# Patient Record
Sex: Female | Born: 1985 | Race: Black or African American | Hispanic: No | Marital: Married | Smoking: Never smoker
Health system: Southern US, Community
[De-identification: ages and names within clinical notes are randomized; demographics above are authoritative.]

## PROBLEM LIST (undated history)

## (undated) DIAGNOSIS — F329 Major depressive disorder, single episode, unspecified: Secondary | ICD-10-CM

## (undated) DIAGNOSIS — O99345 Other mental disorders complicating the puerperium: Secondary | ICD-10-CM

## (undated) DIAGNOSIS — F32A Depression, unspecified: Secondary | ICD-10-CM

## (undated) DIAGNOSIS — R87619 Unspecified abnormal cytological findings in specimens from cervix uteri: Secondary | ICD-10-CM

## (undated) DIAGNOSIS — A64 Unspecified sexually transmitted disease: Secondary | ICD-10-CM

## (undated) DIAGNOSIS — Z8614 Personal history of Methicillin resistant Staphylococcus aureus infection: Secondary | ICD-10-CM

## (undated) DIAGNOSIS — IMO0002 Reserved for concepts with insufficient information to code with codable children: Secondary | ICD-10-CM

## (undated) DIAGNOSIS — D509 Iron deficiency anemia, unspecified: Secondary | ICD-10-CM

## (undated) DIAGNOSIS — F53 Postpartum depression: Secondary | ICD-10-CM

## (undated) HISTORY — DX: Reserved for concepts with insufficient information to code with codable children: IMO0002

## (undated) HISTORY — PX: TUBAL LIGATION: SHX77

## (undated) HISTORY — DX: Unspecified abnormal cytological findings in specimens from cervix uteri: R87.619

## (undated) HISTORY — PX: WISDOM TOOTH EXTRACTION: SHX21

## (undated) HISTORY — PX: CERVICAL BIOPSY  W/ LOOP ELECTRODE EXCISION: SUR135

## (undated) HISTORY — DX: Unspecified sexually transmitted disease: A64

## (undated) HISTORY — PX: OTHER SURGICAL HISTORY: SHX169

---
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
## 2006-07-14 ENCOUNTER — Inpatient Hospital Stay (HOSPITAL_COMMUNITY): Admission: AD | Admit: 2006-07-14 | Discharge: 2006-07-14 | Payer: Self-pay | Admitting: Family Medicine

## 2006-07-30 ENCOUNTER — Inpatient Hospital Stay (HOSPITAL_COMMUNITY): Admission: AD | Admit: 2006-07-30 | Discharge: 2006-07-31 | Payer: Self-pay | Admitting: Obstetrics and Gynecology

## 2006-07-30 ENCOUNTER — Ambulatory Visit: Payer: Self-pay | Admitting: *Deleted

## 2006-10-29 ENCOUNTER — Inpatient Hospital Stay (HOSPITAL_COMMUNITY): Admission: AD | Admit: 2006-10-29 | Discharge: 2006-10-31 | Payer: Self-pay | Admitting: Gynecology

## 2006-10-29 ENCOUNTER — Ambulatory Visit: Payer: Self-pay | Admitting: *Deleted

## 2007-03-19 ENCOUNTER — Emergency Department (HOSPITAL_COMMUNITY): Admission: EM | Admit: 2007-03-19 | Discharge: 2007-03-19 | Payer: Self-pay | Admitting: Emergency Medicine

## 2007-11-03 ENCOUNTER — Emergency Department (HOSPITAL_COMMUNITY): Admission: EM | Admit: 2007-11-03 | Discharge: 2007-11-03 | Payer: Self-pay | Admitting: Emergency Medicine

## 2007-11-07 ENCOUNTER — Ambulatory Visit: Payer: Self-pay | Admitting: Obstetrics and Gynecology

## 2007-12-05 ENCOUNTER — Ambulatory Visit: Payer: Self-pay | Admitting: Obstetrics and Gynecology

## 2007-12-11 ENCOUNTER — Other Ambulatory Visit: Admission: RE | Admit: 2007-12-11 | Discharge: 2007-12-11 | Payer: Self-pay | Admitting: Obstetrics and Gynecology

## 2007-12-11 ENCOUNTER — Ambulatory Visit: Payer: Self-pay | Admitting: Obstetrics & Gynecology

## 2007-12-25 ENCOUNTER — Ambulatory Visit: Payer: Self-pay | Admitting: Obstetrics and Gynecology

## 2008-02-23 ENCOUNTER — Emergency Department (HOSPITAL_COMMUNITY): Admission: EM | Admit: 2008-02-23 | Discharge: 2008-02-23 | Payer: Self-pay | Admitting: Family Medicine

## 2008-04-29 ENCOUNTER — Ambulatory Visit: Payer: Self-pay | Admitting: Gynecology

## 2008-04-29 ENCOUNTER — Encounter (INDEPENDENT_AMBULATORY_CARE_PROVIDER_SITE_OTHER): Payer: Self-pay | Admitting: Gynecology

## 2008-10-29 ENCOUNTER — Encounter: Payer: Self-pay | Admitting: Obstetrics & Gynecology

## 2008-10-29 ENCOUNTER — Ambulatory Visit: Payer: Self-pay | Admitting: Obstetrics and Gynecology

## 2009-05-05 ENCOUNTER — Encounter: Payer: Self-pay | Admitting: Obstetrics & Gynecology

## 2009-05-05 ENCOUNTER — Ambulatory Visit: Payer: Self-pay | Admitting: Obstetrics and Gynecology

## 2009-05-05 LAB — CONVERTED CEMR LAB: GC Probe Amp, Genital: NEGATIVE

## 2009-05-06 ENCOUNTER — Encounter: Payer: Self-pay | Admitting: Obstetrics & Gynecology

## 2009-05-06 LAB — CONVERTED CEMR LAB: Yeast Wet Prep HPF POC: NONE SEEN

## 2009-11-03 ENCOUNTER — Ambulatory Visit: Payer: Self-pay | Admitting: Obstetrics and Gynecology

## 2010-03-21 ENCOUNTER — Ambulatory Visit: Payer: Self-pay | Admitting: Obstetrics & Gynecology

## 2010-04-11 ENCOUNTER — Inpatient Hospital Stay (HOSPITAL_COMMUNITY): Admission: AD | Admit: 2010-04-11 | Discharge: 2010-04-11 | Payer: Self-pay | Admitting: Obstetrics and Gynecology

## 2010-05-13 ENCOUNTER — Emergency Department (HOSPITAL_COMMUNITY): Admission: EM | Admit: 2010-05-13 | Discharge: 2010-05-13 | Payer: Self-pay | Admitting: Emergency Medicine

## 2010-05-15 ENCOUNTER — Emergency Department (HOSPITAL_COMMUNITY): Admission: EM | Admit: 2010-05-15 | Discharge: 2010-05-15 | Payer: Self-pay | Admitting: Emergency Medicine

## 2010-07-01 ENCOUNTER — Emergency Department (HOSPITAL_COMMUNITY): Admission: EM | Admit: 2010-07-01 | Discharge: 2010-07-01 | Payer: Self-pay | Admitting: Emergency Medicine

## 2010-08-07 NOTE — L&D Delivery Note (Signed)
Delivery Note   [name], [name] [[id number]]  At 7:04 AM a viable female was delivered via Vaginal, Spontaneous Delivery (Presentation: Right Occiput Anterior).  APGAR: 8, 9; weight 5 lb 11.9 oz (2605 g).   Placenta status: , .  Cord: 3 vessels with the following complications: None.  Anesthesia: Epidural  Episiotomy: None Lacerations: None Suture Repair: n/a     [name], [name] [[id number]]  At 7:29 AM a viable female was delivered via  (Presentation: Left Occiput Anterio ;  ).  APGAR: 7,9 , ; weight 5 lb 9.4 oz (2534 g).   Placenta status: , .  Cord:  with the following complications: .  Anesthesia: Epidural  Episiotomy:  Lacerations:  Suture Repair: n/a Est. Blood Loss (mL): 600cc  1000 micrograms cytotec per rectum for mild pph.   Mom to AICU for Magnesium sulfate due preeclampsia Baby A to nursery-stable.   Baby B to nursery-stable.  Brittany Transue H. 06/02/2011, 7:59 AM

## 2010-08-23 ENCOUNTER — Emergency Department (HOSPITAL_COMMUNITY)
Admission: EM | Admit: 2010-08-23 | Discharge: 2010-08-23 | Payer: Self-pay | Source: Home / Self Care | Admitting: Emergency Medicine

## 2010-10-17 ENCOUNTER — Emergency Department (HOSPITAL_COMMUNITY)
Admission: EM | Admit: 2010-10-17 | Discharge: 2010-10-17 | Discharge status: Against Medical Advice | Payer: Self-pay | Attending: Emergency Medicine | Admitting: Emergency Medicine

## 2010-10-17 DIAGNOSIS — N63 Unspecified lump in unspecified breast: Secondary | ICD-10-CM | POA: Insufficient documentation

## 2010-10-19 LAB — CULTURE, ROUTINE-ABSCESS

## 2010-10-20 LAB — URINALYSIS, ROUTINE W REFLEX MICROSCOPIC
Bilirubin Urine: NEGATIVE
Leukocytes, UA: NEGATIVE
Specific Gravity, Urine: 1.025 (ref 1.005–1.030)
Urobilinogen, UA: 1 mg/dL (ref 0.0–1.0)

## 2010-10-20 LAB — GC/CHLAMYDIA PROBE AMP, GENITAL: Chlamydia, DNA Probe: NEGATIVE

## 2010-10-20 LAB — WET PREP, GENITAL

## 2010-10-20 LAB — URINE MICROSCOPIC-ADD ON

## 2010-10-24 ENCOUNTER — Ambulatory Visit: Payer: Self-pay | Admitting: Occupational Therapy

## 2010-12-13 ENCOUNTER — Inpatient Hospital Stay (HOSPITAL_COMMUNITY)
Admission: AD | Admit: 2010-12-13 | Discharge: 2010-12-13 | Disposition: A | Discharge status: Home / Self Care | Payer: Medicaid Other | Source: Ambulatory Visit | Attending: Obstetrics and Gynecology | Admitting: Obstetrics and Gynecology

## 2010-12-13 DIAGNOSIS — O2390 Unspecified genitourinary tract infection in pregnancy, unspecified trimester: Secondary | ICD-10-CM | POA: Insufficient documentation

## 2010-12-13 DIAGNOSIS — N760 Acute vaginitis: Secondary | ICD-10-CM | POA: Insufficient documentation

## 2010-12-13 DIAGNOSIS — B9689 Other specified bacterial agents as the cause of diseases classified elsewhere: Secondary | ICD-10-CM | POA: Insufficient documentation

## 2010-12-13 DIAGNOSIS — R109 Unspecified abdominal pain: Secondary | ICD-10-CM

## 2010-12-13 DIAGNOSIS — A499 Bacterial infection, unspecified: Secondary | ICD-10-CM

## 2010-12-13 LAB — WET PREP, GENITAL

## 2010-12-13 LAB — URINALYSIS, ROUTINE W REFLEX MICROSCOPIC
Ketones, ur: NEGATIVE mg/dL
Nitrite: NEGATIVE
Protein, ur: NEGATIVE mg/dL
Urobilinogen, UA: 0.2 mg/dL (ref 0.0–1.0)

## 2010-12-13 LAB — CBC
MCH: 30.5 pg (ref 26.0–34.0)
MCHC: 34.3 g/dL (ref 30.0–36.0)
MCV: 89.2 fL (ref 78.0–100.0)
RBC: 4.06 MIL/uL (ref 3.87–5.11)
WBC: 4.6 10*3/uL (ref 4.0–10.5)

## 2010-12-14 LAB — GC/CHLAMYDIA PROBE AMP, GENITAL: Chlamydia, DNA Probe: NEGATIVE

## 2010-12-19 ENCOUNTER — Other Ambulatory Visit: Payer: Self-pay | Admitting: Family Medicine

## 2010-12-19 DIAGNOSIS — Z3689 Encounter for other specified antenatal screening: Secondary | ICD-10-CM

## 2010-12-19 LAB — GLUCOSE TOLERANCE, 1 HOUR: Glucose, GTT - 1 Hour: 76 mg/dL (ref ?–200)

## 2010-12-19 LAB — RUBELLA ANTIBODY, IGM: Rubella: IMMUNE

## 2010-12-19 LAB — RPR: RPR: NONREACTIVE

## 2010-12-19 LAB — CBC
HCT: 37 % (ref 36–46)
Hemoglobin: 12.2 g/dL (ref 12.0–16.0)
Platelets: 119 10*3/uL — AB (ref 150–399)

## 2010-12-19 LAB — GC/CHLAMYDIA PROBE AMP, GENITAL: Gonorrhea: NEGATIVE
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
## 2010-12-20 NOTE — Group Therapy Note (Signed)
NAME:  [name], [name] NO.:  [id number]   MEDICAL RECORD NO.:  [id number]          PATIENT TYPE:  WOC   LOCATION[facility]                   [facility]   PHYSICIAN:  Scheryl Darter, MD       DATE OF BIRTH:  Aug 01, 1986   DATE OF SERVICE:                                  CLINIC NOTE   The patient returns for a repeat Pap smear.  She has had a history of  severe cervical dysplasia after a LEEP was done on Dec 11, 2007.  The  surgical resection margins were negative.  Pap smear done May 02, 2008 showed ASCUS with high-risk HPV not detected.  The patient has been  treated for Trichomonas in the past.  She says her period is just  starting but is light.  She has an IUD in place.   PHYSICAL EXAMINATION:  GENERAL:  In no acute distress.  PELVIC:  External genitalia appeared normal.  Vagina appeared normal.  IUD strings visible about 2 cm.  Pap smear was obtained.   IMPRESSION:  Follow up after loop electrical excision procedure showing  severe dysplasia.   PLAN:  If Pap is normal, she will return for a repeat Pap in 6 months,  and then I would release her for yearly follow up.  STD probe was done  today at her request.      Scheryl Darter, MD     JA/MEDQ  D:  10/29/2008  T:  10/29/2008  Job:  [id number]

## 2010-12-20 NOTE — Group Therapy Note (Signed)
NAME:  [name], MANGAL NO.:  [id number]   MEDICAL RECORD NO.:  [id number]          PATIENT TYPE:  WOC   LOCATION:  [facility]                   [facility]   PHYSICIAN:  Ginger Carne, MD DATE OF BIRTH:  23-Jun-1986   DATE OF SERVICE:  04/29/2008                                  CLINIC NOTE   Patient returns today for follow-up Pap smear after having a LEEP  procedure revealing CIN III with endocervical gland involvement.  The  surgical resection margins were negative on Dec 11, 2007.   A Pap smear was performed today.  The cervix appears smooth without  erosions or lesions.  She was advised to return in six months.  She  indicated she will probably go back to [facility], which is fine,  and/or she was invited to return to our clinic in six months time.           ______________________________  Ginger Carne, MD     SHB/MEDQ  D:  04/29/2008  T:  04/29/2008  Job:  [id number]

## 2010-12-23 NOTE — Discharge Summary (Signed)
[facility]  Patient:    [name], [name]                        MRN: [id number] Adm. Date:  [id number] Disc. Date: [id number] Attending:  Michaelle Copas Dictator:   Jamey Reas, M.D.                           Discharge Summary  DATE OF BIRTH:                January 16, 1986  ADMISSION DIAGNOSES:          1. Preterm labor.                               2. Twenty-eight and 4-week intrauterine pregnancy.                               3. Positive group B strep.  DISCHARGE DIAGNOSES:          1. Preterm labor.                               2. Twenty-eight and 4-week intrauterine pregnancy.                               3. Positive group B strep.  CONSULTS:                     None.  REFERRING PHYSICIANS:         None.  PROCEDURES:                   None.  ADMISSION HISTORY:            A 25 year old, G1, P0, at 17 and 4 admitted for threatened preterm labor as well as rule out IUGR.  The patient was admitted for IV antibiotics, betamethasone, and ultrasound.  The patients cervix on admission was external os 2 cm, internal os finger tip, closed, long, and posterior.  Did have some lower uterine segment development.  MEDICATIONS ON ADMISSION:     1. Nifedipine 60 mg p.o. q.d.                               2. Trimox 500 mg.                               3. Iron.                               4. Prenatal vitamins.  PRENATAL LABORATORY DATA:     She was diagnosed with positive group B strep on March 26, 2000.  HOSPITAL COURSE:              The patient was admitted and placed on IV Unasyn.  She remained on IV Unasyn throughout hospitalization.  This was continued through April 17, 2000, when her IV infiltrated.  The patient was also maintained on continuous monitoring.  She received two doses of betamethasone 12.5 mg  IM q.24h. x 2.  She was also taking prenatal vitamins.  The patient remained on Procardia throughout  hospitalization.  She never received magnesium.  The patient remained stable throughout hospitalization.  Her cervix never changed.  She did not have any uterine activity over the past two to three days of hospitalization.  Her baby n continuous monitoring showed baseline approximately 135 to 145 with positive accelerations and reactive without decelerations.  No uterine activity on the day of discharge.  CONDITION ON DISCHARGE:       The patient was discharged to home, stable on p.o. Procardia.  DISCHARGE INSTRUCTIONS:       She will continue home uterine monitoring at home. She received antenatal discharge instructions prior to leaving.  FOLLOWUP:                     Follow up at [facility] on April 26, 2000, at 9 a.m. DD:  05/16/00 TD:  05/17/00 Job: [id number]

## 2010-12-23 NOTE — Discharge Summary (Signed)
[facility]  Patient:    [name], [name]                        MRN: [id number] Adm. Date:  [id number] Disc. Date: [id number] Attending:  Tammi Sou Dictator:   Pricilla Holm, M.D.                           Discharge Summary  ADMISSION DIAGNOSIS:          Preterm labor and cervicitis.  HISTORY:                      This is a 25 year old G1, P0 at 23-1/7 weeks by a 17-week ultrasound who presented to [facility] with decreased fetal movement and dysuria.  The patient was unaware of contractions at that time.  The patient was taking her prenatal vitamins and has a GYN history for yeast infections.  Her GC and chlamydia prenatally were negative and her vital signs were stable.  She was afebrile.  Physical exam was unremarkable.  Her cervical check was close, thick and high anterior.  Group B Strep was unknown at that time, now known to be positive.  Her wet prep showed negative ______ and too numerous to count white blood cells and moderate bacteria.  HOSPITAL COURSE:              The patient was admitted to the [facility] teaching service on March 04, 2000 to the antenatal department for IV Unasyn, as well as Motrin for 24 hours.  The patient did well on those regimens and contractions did cease by March 05, 2000.  The patient was taken off of Motrin and put on Procardia XL 60 mg q.d.  The patient was given bedrest with bathroom privileges.  The IV Unasyn was discontinued and the patient was started on Augmentin p.o. 875 mg b.i.d.  The patient continued to do well and was discharged to home on March 08, 2000.  CONDITION ON DISCHARGE:       Good.  DISPOSITION:                  The patient was discharged to home.  DISCHARGE MEDICATIONS:        The patient will continue taking her prenatal vitamin and she was also given a prescription for Procardia XL 60 mg to be taken q.d.  The patient was also given a prescription for  amoxicillin 500 mg 1 tablet p.o. b.i.d. x 14 days.  DISCHARGE INSTRUCTIONS:       The patient was told to limit her activity, bedrest, bathroom privileges.  She is to continue to drink plenty of fluids and take her medications as prescribed.  FOLLOW-UP:                    The patient is to follow up at high-risk clinic at [facility] on Thursdays.  This appointment will be made for her prior to her actual discharge from the hospital.  The patient voiced agreement and understanding of the above instructions.  Her mother will contact me if she has any additional questions or concerns. DD:  03/08/00 TD:  03/10/00 Job: [id number]

## 2010-12-23 NOTE — Discharge Summary (Signed)
[name], [name]                           ACCOUNT NO.:  [id number]   MEDICAL RECORD NO.:  [id number]                   PATIENT TYPE:  INP   LOCATION:  9165                                 [facility]   PHYSICIAN:  Phil D. Okey Dupre, M.D.                  DATE OF BIRTH:  1986-07-11   DATE OF ADMISSION:  01/14/2003  DATE OF DISCHARGE:  01/16/2003                                 DISCHARGE SUMMARY   REASON FOR ADMISSION:  Preterm labor.   HOSPITAL COURSE:  The patient is a 25 year old G2, P1 presented at  approximately [redacted] weeks gestational age with contractions.  Her cervix was 3  cm dilated, 50% effaced, -2 station.  She did progress, was being watched 2,  3 and -2.  She continued to have irregular contractions for the first 24-36  hours.  The last 12-24 hours; however, her contractions ceased.  She no  longer could feel the tightening and they were also not monitored.  Baby had  reactive reassuring strip with great variability.  She maintained no  cervical change for 24 hours.  She was GBS positive and was found to have a  few trichomonas on wet prep.  As such she was treated with IV penicillin and  also oral Flagyl.  She had an incidental finding of low platelet count of  122 and a white blood cell count of 5.3.  At the time of this dictation  those labs have been redrawn and are pending.  They will be added as an  addendum.  She was doing well on the morning of discharge and will be sent  home on modified bed rest to have close followup in the [facility].  Will be continuing her Flagyl for her trichomonas completing a  weeks' course of therapy.   FINAL DISCHARGE DIAGNOSES:  1. Preterm labor.  2. Trichomonal vaginitis.  3. Group B strep positive.   DISCHARGE INSTRUCTIONS:  1. Medications:  Flagyl 500 mg one tablet b.i.d. for 5-1/2 days with her     first dose starting tonight.  2. Diet is regular.  3. Activity:  Bed rest modified.  She may get up to eat and use  the bathroom     or shower.  4. Sexual activity:  She should not have sexual intercourse or do anything     that would cause an orgasm.   FOLLOW UP:  1. Followup in [facility].  Will arrange an appointment prior to     her discharge.  2. She will be discharged home.  She actually lives outside of [place]     but will be staying with family friends within the city such that if her     labor should begin again she would have easy access to [facility]     [place].        Obstetrics  Resident                       Javier Glazier. Okey Dupre, M.D.    OR/MEDQ  D:  01/16/2003  T:  01/16/2003  Job:  [id number]

## 2011-01-06 ENCOUNTER — Inpatient Hospital Stay (HOSPITAL_COMMUNITY): Admission: AD | Admit: 2011-01-06 | Payer: Self-pay | Source: Ambulatory Visit | Admitting: Family Medicine

## 2011-01-09 ENCOUNTER — Other Ambulatory Visit (HOSPITAL_COMMUNITY): Payer: Self-pay | Admitting: Obstetrics & Gynecology

## 2011-01-09 ENCOUNTER — Inpatient Hospital Stay (HOSPITAL_COMMUNITY)
Admission: AD | Admit: 2011-01-09 | Discharge: 2011-01-09 | Disposition: A | Discharge status: Home / Self Care | Payer: Medicaid Other | Source: Ambulatory Visit | Attending: Obstetrics & Gynecology | Admitting: Obstetrics & Gynecology

## 2011-01-09 ENCOUNTER — Ambulatory Visit (HOSPITAL_COMMUNITY)
Admission: RE | Admit: 2011-01-09 | Discharge: 2011-01-09 | Disposition: A | Discharge status: Home / Self Care | Payer: Medicaid Other | Source: Ambulatory Visit | Attending: Obstetrics & Gynecology | Admitting: Obstetrics & Gynecology

## 2011-01-09 DIAGNOSIS — R52 Pain, unspecified: Secondary | ICD-10-CM

## 2011-01-09 DIAGNOSIS — R109 Unspecified abdominal pain: Secondary | ICD-10-CM | POA: Insufficient documentation

## 2011-01-09 DIAGNOSIS — O210 Mild hyperemesis gravidarum: Secondary | ICD-10-CM | POA: Insufficient documentation

## 2011-01-09 DIAGNOSIS — O2390 Unspecified genitourinary tract infection in pregnancy, unspecified trimester: Secondary | ICD-10-CM | POA: Insufficient documentation

## 2011-01-09 DIAGNOSIS — Z3689 Encounter for other specified antenatal screening: Secondary | ICD-10-CM | POA: Insufficient documentation

## 2011-01-09 DIAGNOSIS — A499 Bacterial infection, unspecified: Secondary | ICD-10-CM | POA: Insufficient documentation

## 2011-01-09 DIAGNOSIS — N760 Acute vaginitis: Secondary | ICD-10-CM | POA: Insufficient documentation

## 2011-01-09 DIAGNOSIS — B9689 Other specified bacterial agents as the cause of diseases classified elsewhere: Secondary | ICD-10-CM | POA: Insufficient documentation

## 2011-01-09 LAB — URINALYSIS, ROUTINE W REFLEX MICROSCOPIC
Bilirubin Urine: NEGATIVE
Glucose, UA: NEGATIVE mg/dL
Leukocytes, UA: NEGATIVE
Nitrite: NEGATIVE
Specific Gravity, Urine: 1.025 (ref 1.005–1.030)

## 2011-01-09 LAB — WET PREP, GENITAL: Trich, Wet Prep: NONE SEEN

## 2011-01-09 LAB — URINE MICROSCOPIC-ADD ON

## 2011-01-10 ENCOUNTER — Other Ambulatory Visit: Payer: Self-pay | Admitting: Family Medicine

## 2011-01-10 DIAGNOSIS — Z3689 Encounter for other specified antenatal screening: Secondary | ICD-10-CM

## 2011-01-10 LAB — ABO/RH: ABO/RH(D): O POS

## 2011-01-10 LAB — CBC
Platelets: 131 10*3/uL — ABNORMAL LOW (ref 150–400)
RBC: 3.88 MIL/uL (ref 3.87–5.11)
WBC: 7 10*3/uL (ref 4.0–10.5)

## 2011-01-10 LAB — GC/CHLAMYDIA PROBE AMP, GENITAL
Chlamydia, DNA Probe: NEGATIVE
GC Probe Amp, Genital: NEGATIVE

## 2011-01-26 ENCOUNTER — Ambulatory Visit (HOSPITAL_COMMUNITY)
Admission: RE | Admit: 2011-01-26 | Discharge: 2011-01-26 | Disposition: A | Discharge status: Home / Self Care | Payer: Medicaid Other | Source: Ambulatory Visit | Attending: Family Medicine | Admitting: Family Medicine

## 2011-01-26 ENCOUNTER — Ambulatory Visit (HOSPITAL_COMMUNITY): Admission: RE | Admit: 2011-01-26 | Payer: Medicaid Other | Source: Ambulatory Visit

## 2011-01-26 ENCOUNTER — Encounter (HOSPITAL_COMMUNITY): Payer: Self-pay

## 2011-01-26 ENCOUNTER — Other Ambulatory Visit: Payer: Self-pay | Admitting: Obstetrics & Gynecology

## 2011-01-26 DIAGNOSIS — Z1389 Encounter for screening for other disorder: Secondary | ICD-10-CM | POA: Insufficient documentation

## 2011-01-26 DIAGNOSIS — O358XX0 Maternal care for other (suspected) fetal abnormality and damage, not applicable or unspecified: Secondary | ICD-10-CM | POA: Insufficient documentation

## 2011-01-26 DIAGNOSIS — O30009 Twin pregnancy, unspecified number of placenta and unspecified number of amniotic sacs, unspecified trimester: Secondary | ICD-10-CM | POA: Insufficient documentation

## 2011-01-26 DIAGNOSIS — Z3689 Encounter for other specified antenatal screening: Secondary | ICD-10-CM

## 2011-01-26 DIAGNOSIS — Z363 Encounter for antenatal screening for malformations: Secondary | ICD-10-CM | POA: Insufficient documentation

## 2011-01-26 LAB — POCT URINALYSIS DIP (DEVICE)
Glucose, UA: NEGATIVE mg/dL
Nitrite: NEGATIVE
Urobilinogen, UA: 0.2 mg/dL (ref 0.0–1.0)

## 2011-02-09 ENCOUNTER — Other Ambulatory Visit: Payer: Self-pay | Admitting: Obstetrics and Gynecology

## 2011-02-09 DIAGNOSIS — O09219 Supervision of pregnancy with history of pre-term labor, unspecified trimester: Secondary | ICD-10-CM

## 2011-02-09 DIAGNOSIS — O30009 Twin pregnancy, unspecified number of placenta and unspecified number of amniotic sacs, unspecified trimester: Secondary | ICD-10-CM

## 2011-02-09 DIAGNOSIS — D696 Thrombocytopenia, unspecified: Secondary | ICD-10-CM

## 2011-02-09 LAB — POCT URINALYSIS DIP (DEVICE)
Bilirubin Urine: NEGATIVE
Glucose, UA: NEGATIVE mg/dL
Nitrite: NEGATIVE
pH: 7 (ref 5.0–8.0)

## 2011-02-23 ENCOUNTER — Other Ambulatory Visit: Payer: Self-pay | Admitting: Family Medicine

## 2011-02-23 ENCOUNTER — Other Ambulatory Visit: Payer: Self-pay | Admitting: Physician Assistant

## 2011-02-23 DIAGNOSIS — D696 Thrombocytopenia, unspecified: Secondary | ICD-10-CM

## 2011-02-23 DIAGNOSIS — O30009 Twin pregnancy, unspecified number of placenta and unspecified number of amniotic sacs, unspecified trimester: Secondary | ICD-10-CM

## 2011-02-23 LAB — POCT URINALYSIS DIP (DEVICE)
Ketones, ur: NEGATIVE mg/dL
Protein, ur: 30 mg/dL — AB

## 2011-02-25 LAB — CULTURE, OB URINE: Colony Count: 45000

## 2011-02-27 ENCOUNTER — Telehealth: Payer: Self-pay | Admitting: *Deleted

## 2011-02-27 DIAGNOSIS — O2340 Unspecified infection of urinary tract in pregnancy, unspecified trimester: Secondary | ICD-10-CM

## 2011-02-27 MED ORDER — AMOXICILLIN 500 MG PO CAPS: 500.0000 mg | ORAL_CAPSULE | Freq: Three times a day (TID) | ORAL | Status: AC

## 2011-02-27 NOTE — Telephone Encounter (Signed)
Message copied by Gerome Apley on Mon Feb 27, 2011  4:14 PM ------      Message from: Maylon Cos E      Created: Mon Feb 27, 2011  8:03 AM       Rx Amoxil 500mg  TID x 7 days. RN to call pt with results and send/call in Rx

## 2011-03-02 ENCOUNTER — Ambulatory Visit (HOSPITAL_COMMUNITY): Payer: Medicaid Other

## 2011-03-02 ENCOUNTER — Ambulatory Visit (HOSPITAL_COMMUNITY)
Admission: RE | Admit: 2011-03-02 | Discharge: 2011-03-02 | Disposition: A | Discharge status: Home / Self Care | Payer: Medicaid Other | Source: Ambulatory Visit | Attending: Obstetrics and Gynecology | Admitting: Obstetrics and Gynecology

## 2011-03-02 DIAGNOSIS — O09219 Supervision of pregnancy with history of pre-term labor, unspecified trimester: Secondary | ICD-10-CM

## 2011-03-02 DIAGNOSIS — O30009 Twin pregnancy, unspecified number of placenta and unspecified number of amniotic sacs, unspecified trimester: Secondary | ICD-10-CM | POA: Insufficient documentation

## 2011-03-02 DIAGNOSIS — O09299 Supervision of pregnancy with other poor reproductive or obstetric history, unspecified trimester: Secondary | ICD-10-CM | POA: Insufficient documentation

## 2011-03-09 ENCOUNTER — Other Ambulatory Visit: Payer: Self-pay | Admitting: Obstetrics and Gynecology

## 2011-03-09 ENCOUNTER — Other Ambulatory Visit: Payer: Self-pay | Admitting: Family Medicine

## 2011-03-09 DIAGNOSIS — O30009 Twin pregnancy, unspecified number of placenta and unspecified number of amniotic sacs, unspecified trimester: Secondary | ICD-10-CM

## 2011-03-09 DIAGNOSIS — IMO0001 Reserved for inherently not codable concepts without codable children: Secondary | ICD-10-CM

## 2011-03-09 DIAGNOSIS — O09219 Supervision of pregnancy with history of pre-term labor, unspecified trimester: Secondary | ICD-10-CM

## 2011-03-09 DIAGNOSIS — D696 Thrombocytopenia, unspecified: Secondary | ICD-10-CM

## 2011-03-09 DIAGNOSIS — Z1389 Encounter for screening for other disorder: Secondary | ICD-10-CM

## 2011-03-09 LAB — POCT URINALYSIS DIP (DEVICE)
Hgb urine dipstick: NEGATIVE
Leukocytes, UA: NEGATIVE
Protein, ur: 30 mg/dL — AB
Specific Gravity, Urine: 1.025 (ref 1.005–1.030)
pH: 7 (ref 5.0–8.0)

## 2011-03-10 LAB — CBC
HCT: 36 % (ref 36.0–46.0)
MCH: 31.5 pg (ref 26.0–34.0)
MCHC: 32.5 g/dL (ref 30.0–36.0)
MCV: 97 fL (ref 78.0–100.0)
RDW: 14.3 % (ref 11.5–15.5)

## 2011-03-23 ENCOUNTER — Other Ambulatory Visit: Payer: Self-pay | Admitting: Obstetrics & Gynecology

## 2011-03-23 DIAGNOSIS — O0930 Supervision of pregnancy with insufficient antenatal care, unspecified trimester: Secondary | ICD-10-CM

## 2011-03-23 DIAGNOSIS — O30009 Twin pregnancy, unspecified number of placenta and unspecified number of amniotic sacs, unspecified trimester: Secondary | ICD-10-CM

## 2011-03-23 LAB — POCT URINALYSIS DIP (DEVICE)
Nitrite: NEGATIVE
Protein, ur: 30 mg/dL — AB
Urobilinogen, UA: 1 mg/dL (ref 0.0–1.0)
pH: 7 (ref 5.0–8.0)

## 2011-03-24 LAB — CBC
MCH: 31 pg (ref 26.0–34.0)
MCHC: 33.5 g/dL (ref 30.0–36.0)
RDW: 13 % (ref 11.5–15.5)

## 2011-03-24 LAB — HIV ANTIBODY (ROUTINE TESTING W REFLEX): HIV: NONREACTIVE

## 2011-03-24 LAB — RPR

## 2011-03-31 ENCOUNTER — Inpatient Hospital Stay (HOSPITAL_COMMUNITY)
Admission: AD | Admit: 2011-03-31 | Discharge: 2011-04-01 | Disposition: A | Discharge status: Home / Self Care | Payer: Medicaid Other | Source: Ambulatory Visit | Attending: Obstetrics & Gynecology | Admitting: Obstetrics & Gynecology

## 2011-03-31 ENCOUNTER — Encounter (HOSPITAL_COMMUNITY): Payer: Self-pay | Admitting: *Deleted

## 2011-03-31 DIAGNOSIS — O4700 False labor before 37 completed weeks of gestation, unspecified trimester: Secondary | ICD-10-CM | POA: Insufficient documentation

## 2011-03-31 DIAGNOSIS — R1031 Right lower quadrant pain: Secondary | ICD-10-CM | POA: Insufficient documentation

## 2011-03-31 DIAGNOSIS — R1032 Left lower quadrant pain: Secondary | ICD-10-CM | POA: Insufficient documentation

## 2011-03-31 NOTE — Progress Notes (Signed)
Pt states, " I've had pelvic pressure and contractions, like the babies are bally up. I've been nauseated and cramping in my upper abdomen.All of this started at 4 am, and has been going on all day."

## 2011-03-31 NOTE — Progress Notes (Signed)
G4P3 at 28.2wks. Have pelvic pressure and sharp pains in middle of back coming around to front. Hard to walk due to increased pelvic pressure. Pain and pressure present since 0400. Feels like stomach balls up. Some white d/c

## 2011-04-01 LAB — URINALYSIS, ROUTINE W REFLEX MICROSCOPIC
Bilirubin Urine: NEGATIVE
Glucose, UA: NEGATIVE mg/dL
Hgb urine dipstick: NEGATIVE
Protein, ur: NEGATIVE mg/dL
Urobilinogen, UA: 0.2 mg/dL (ref 0.0–1.0)

## 2011-04-01 NOTE — Progress Notes (Signed)
Dr. Stinson in to see pt. 

## 2011-04-01 NOTE — ED Provider Notes (Signed)
History     Chief Complaint  Patient presents with  . Abdominal Cramping  . Contractions   Abdominal Cramping This is a new problem. The current episode started yesterday. The onset quality is gradual. The problem occurs intermittently (every 30 minutes). The pain is located in the RLQ and LLQ. The pain is moderate. The quality of the pain is cramping. Associated symptoms include constipation and frequency. Pertinent negatives include no diarrhea, dysuria, fever, headaches, hematochezia, hematuria, nausea or vomiting. It is movement what aggravates the pain. The pain is relieved by being still. She has tried nothing for the symptoms.    OB History    Grav Para Term Preterm Abortions TAB SAB Ect Mult Living   4 3 3  0 0 0 0 0 0 3      Past Medical History  Diagnosis Date  . No pertinent past medical history     Past Surgical History  Procedure Date  . No past surgeries     No family history on file.  History  Substance Use Topics  . Smoking status: Never Smoker   . Smokeless tobacco: Never Used  . Alcohol Use: No    Allergies: No Known Allergies  Prescriptions prior to admission  Medication Sig Dispense Refill  . acetaminophen (TYLENOL) 500 MG tablet Take 500 mg by mouth every 6 (six) hours as needed. For headache       . prenatal vitamin w/FE, FA (PRENATAL 1 + 1) 27-1 MG TABS Take 1 tablet by mouth daily.          Review of Systems  Constitutional: Negative for fever.  Gastrointestinal: Positive for constipation. Negative for nausea, vomiting, diarrhea and hematochezia.  Genitourinary: Positive for frequency. Negative for dysuria and hematuria.  Neurological: Negative for headaches.   Physical Exam   Blood pressure 108/70, pulse 83, temperature 98 F (36.7 C), temperature source Oral, resp. rate 20, height 5\' 8"  (1.727 m), weight 75.921 kg (167 lb 6 oz).  Physical Exam  Constitutional: She appears well-developed and well-nourished.  HENT:  Head: Normocephalic  and atraumatic.  Eyes: Conjunctivae are normal. Pupils are equal, round, and reactive to light.  Neck: Normal range of motion. Neck supple.  GI: Soft. Bowel sounds are normal. She exhibits no distension and no mass. There is tenderness. There is no rebound and no guarding.       Mild tenderness lower quadrants   Dilation: Closed Cervical Position: Posterior Exam by:: Dr Adrian Blackwater  Urine dipstick shows negative for all components.   MAU Course  Procedures  MDM  Assessment and Plan  1.  Abd Pain - Pt feels that this is contractions.  No cervical change.  Tylenol to ease abd pain.  Labor precautions given.  Lynia Landry JEHIEL 04/01/2011, 12:08 AM

## 2011-04-01 NOTE — Progress Notes (Signed)
Baby B was not added into obix but paper EFm strip saved and reviewed by Dr Carron Brazen.

## 2011-04-01 NOTE — Progress Notes (Signed)
Dr Carron Brazen in to discuss d/c plan with pt

## 2011-04-04 ENCOUNTER — Ambulatory Visit (HOSPITAL_COMMUNITY)
Admission: RE | Admit: 2011-04-04 | Discharge: 2011-04-04 | Disposition: A | Discharge status: Home / Self Care | Payer: Medicaid Other | Source: Ambulatory Visit | Attending: Obstetrics and Gynecology | Admitting: Obstetrics and Gynecology

## 2011-04-04 DIAGNOSIS — Z1389 Encounter for screening for other disorder: Secondary | ICD-10-CM

## 2011-04-04 DIAGNOSIS — O09299 Supervision of pregnancy with other poor reproductive or obstetric history, unspecified trimester: Secondary | ICD-10-CM | POA: Insufficient documentation

## 2011-04-04 DIAGNOSIS — O30009 Twin pregnancy, unspecified number of placenta and unspecified number of amniotic sacs, unspecified trimester: Secondary | ICD-10-CM | POA: Insufficient documentation

## 2011-04-04 DIAGNOSIS — IMO0001 Reserved for inherently not codable concepts without codable children: Secondary | ICD-10-CM

## 2011-04-05 DIAGNOSIS — R87619 Unspecified abnormal cytological findings in specimens from cervix uteri: Secondary | ICD-10-CM | POA: Insufficient documentation

## 2011-04-05 DIAGNOSIS — Z8659 Personal history of other mental and behavioral disorders: Secondary | ICD-10-CM | POA: Insufficient documentation

## 2011-04-05 DIAGNOSIS — O30049 Twin pregnancy, dichorionic/diamniotic, unspecified trimester: Secondary | ICD-10-CM | POA: Insufficient documentation

## 2011-04-05 DIAGNOSIS — Z9889 Other specified postprocedural states: Secondary | ICD-10-CM | POA: Insufficient documentation

## 2011-04-05 DIAGNOSIS — Z87898 Personal history of other specified conditions: Secondary | ICD-10-CM

## 2011-04-05 DIAGNOSIS — D696 Thrombocytopenia, unspecified: Secondary | ICD-10-CM

## 2011-04-05 DIAGNOSIS — O09219 Supervision of pregnancy with history of pre-term labor, unspecified trimester: Secondary | ICD-10-CM

## 2011-04-06 ENCOUNTER — Ambulatory Visit (INDEPENDENT_AMBULATORY_CARE_PROVIDER_SITE_OTHER): Payer: Medicaid Other | Admitting: Family Medicine

## 2011-04-06 DIAGNOSIS — Z8659 Personal history of other mental and behavioral disorders: Secondary | ICD-10-CM

## 2011-04-06 DIAGNOSIS — Z8742 Personal history of other diseases of the female genital tract: Secondary | ICD-10-CM

## 2011-04-06 DIAGNOSIS — O30049 Twin pregnancy, dichorionic/diamniotic, unspecified trimester: Secondary | ICD-10-CM

## 2011-04-06 DIAGNOSIS — O09299 Supervision of pregnancy with other poor reproductive or obstetric history, unspecified trimester: Secondary | ICD-10-CM

## 2011-04-06 DIAGNOSIS — O09219 Supervision of pregnancy with history of pre-term labor, unspecified trimester: Secondary | ICD-10-CM

## 2011-04-06 DIAGNOSIS — O30009 Twin pregnancy, unspecified number of placenta and unspecified number of amniotic sacs, unspecified trimester: Secondary | ICD-10-CM

## 2011-04-06 DIAGNOSIS — D696 Thrombocytopenia, unspecified: Secondary | ICD-10-CM

## 2011-04-06 DIAGNOSIS — O3440 Maternal care for other abnormalities of cervix, unspecified trimester: Secondary | ICD-10-CM

## 2011-04-06 DIAGNOSIS — Z87898 Personal history of other specified conditions: Secondary | ICD-10-CM

## 2011-04-06 LAB — POCT URINALYSIS DIP (DEVICE)
Glucose, UA: 100 mg/dL — AB
Leukocytes, UA: NEGATIVE
Nitrite: NEGATIVE
Protein, ur: 100 mg/dL — AB
Specific Gravity, Urine: >=1.03 (ref 1.005–1.030)
Urobilinogen, UA: 1 mg/dL (ref 0.0–1.0)

## 2011-04-06 NOTE — Progress Notes (Signed)
U/S 8/28 A-vtx, 1384gm, nml fluid B-breech, 1251gms, nml fluid 10% discordancy 1 hr 113 Desires BTL-papers signed Subjective:    [name] is a 25 y.o. [phone] [redacted]w[redacted]d being seen today for her obstetrical visit.  Patient reports no complaints. Fetal movement: normal.  Objective:    BP 110/68  Temp 97.7 F (36.5 C)  Wt 170 lb (77.111 kg)  LMP 10/01/2010  Physical Exam  Exam  FHT:  140/150 BPM  Uterine Size: 34 cm  Presentation: cephalic     Assessment:    Pregnancy:  [id number]    Plan:    Patient Active Problem List  Diagnoses  . Twin gestation, dichorionic/diamniotic (two placentae, two amniotic sacs)  . Thrombocytopenia  . Pregnancy with history of pre-term labor  . History of abnormal Pap smear  . History of postpartum depression, currently pregnant  . Hx LEEP (loop electrosurgical excision procedure), cervix, pregnancy    continue monitoring Follow up in 2 Weeks.

## 2011-04-06 NOTE — Progress Notes (Signed)
Had a large amount of milky white discharge this morning.Having pressure, no pain.

## 2011-04-06 NOTE — Patient Instructions (Signed)
Pregnancy - Third Trimester The third trimester of pregnancy (the last 3 months) is a period of the most rapid growth for you and your baby. The baby approaches a length of 20 inches and a weight of 6 to 10 pounds. The baby is adding on fat and getting ready for life outside your body. While inside, babies have periods of sleeping and waking, suck their thumbs, and hiccups. You can often feel small contractions of the uterus. This is false labor. It is also called Braxton-Hicks contractions. This is like a practice for labor. The usual problems in this stage of pregnancy include more difficulty breathing, swelling of the hands and feet from water retention, and having to urinate more often because of the uterus and baby pressing on your bladder.  PRENATAL EXAMS  Blood work may continue to be done during prenatal exams. These tests are done to check on your health and the probable health of your baby. Blood work is used to follow your blood levels (hemoglobin). Anemia (low hemoglobin) is common during pregnancy. Iron and vitamins are given to help prevent this. You may also continue to be checked for diabetes. Some of the past blood tests may be done again.   The size of the uterus is measured during each visit. This makes sure your baby is growing properly according to your pregnancy dates.   Your blood pressure is checked every prenatal visit. This is to make sure you are not getting toxemia.   Your urine is checked every prenatal visit for infection, diabetes and protein.   Your weight is checked at each visit. This is done to make sure gains are happening at the suggested rate and that you and your baby are growing normally.   Sometimes, an ultrasound is performed to confirm the position and the proper growth and development of the baby. This is a test done that bounces harmless sound waves off the baby so your caregiver can more accurately determine due dates.   Discuss the type of pain  medication and anesthesia you will have during your labor and delivery.   Discuss the possibility and anesthesia if a Cesarean Section might be necessary.   Inform your caregiver if there is any mental or physical violence at home.  Sometimes, a specialized non-stress test, contraction stress test and biophysical profile are done to make sure the baby is not having a problem. Checking the amniotic fluid surrounding the baby is called an amniocentesis. The amniotic fluid is removed by sticking a needle into the belly (abdomen). This is sometimes done near the end of pregnancy if an early delivery is required. In this case, it is done to help make sure the baby's lungs are mature enough for the baby to live outside of the womb. If the lungs are not mature and it is unsafe to deliver the baby, an injection of cortisone medication is given to the mother 1 to 2 days before the delivery. This helps the baby's lungs mature and makes it safer to deliver the baby. CHANGES OCCURING IN THE THIRD TRIMESTER OF PREGNANCY Your body goes through many changes during pregnancy. They vary from person to person. Talk to your caregiver about changes you notice and are concerned about.  During the last trimester, you have probably had an increase in your appetite. It is normal to have cravings for certain foods. This varies from person to person and pregnancy to pregnancy.   You may begin to get stretch marks on your hips,   abdomen, and breasts. These are normal changes in the body during pregnancy. There are no exercises or medications to take which prevent this change.   Constipation may be treated with a stool softener or adding bulk to your diet. Drinking lots of fluids, fiber in vegetables, fruits, and whole grains are helpful.   Exercising is also helpful. If you have been very active up until your pregnancy, most of these activities can be continued during your pregnancy. If you have been less active, it is helpful  to start an exercise program such as walking. Consult your caregiver before starting exercise programs.   Avoid all smoking, alcohol, un-prescribed drugs, herbs and "street drugs" during your pregnancy. These chemicals affect the formation and growth of the baby. Avoid chemicals throughout the pregnancy to ensure the delivery of a healthy infant.   Backache, varicose veins and hemorrhoids may develop or get worse.   You will tire more easily in the third trimester, which is normal.   The baby's movements may be stronger and more often.   You may become short of breath easily.   Your belly button may stick out.   A yellow discharge may leak from your breasts called colostrum.   You may have a bloody mucus discharge. This usually occurs a few days to a week before labor begins.  HOME CARE INSTRUCTIONS  Keep your caregiver's appointments. Follow your caregiver's instructions regarding medication use, exercise, and diet.   During pregnancy, you are providing food for you and your baby. Continue to eat regular, well-balanced meals. Choose foods such as meat, fish, milk and other low fat dairy products, vegetables, fruits, and whole-grain breads and cereals. Your caregiver will tell you of the ideal weight gain.   A physical sexual relationship may be continued throughout pregnancy if there are no other problems such as early (premature) leaking of amniotic fluid from the membranes, vaginal bleeding, or belly (abdominal) pain.   Exercise regularly if there are no restrictions. Check with your caregiver if you are unsure of the safety of your exercises. Greater weight gain will occur in the last 2 trimesters of pregnancy. Exercising helps:   Control your weight.   Get you in shape for labor and delivery.   You lose weight after you deliver.   Rest a lot with legs elevated, or as needed for leg cramps or low back pain.   Wear a good support or jogging bra for breast tenderness during  pregnancy. This may help if worn during sleep. Pads or tissues may be used in the bra if you are leaking colostrum.   Do not use hot tubs, steam rooms, or saunas.   Wear your seat belt when driving. This protects you and your baby if you are in an accident.   Avoid raw meat, cat litter boxes and soil used by cats. These carry germs that can cause birth defects in the baby.   It is easier to loose urine during pregnancy. Tightening up and strengthening the pelvic muscles will help with this problem. You can practice stopping your urination while you are going to the bathroom. These are the same muscles you need to strengthen. It is also the muscles you would use if you were trying to stop from passing gas. You can practice tightening these muscles up 10 times a set and repeating this about 3 times per day. Once you know what muscles to tighten up, do not perform these exercises during urination. It is more likely to  cause an infection by backing up the urine.   Ask for help if you have financial, counseling or nutritional needs during pregnancy. Your caregiver will be able to offer counseling for these needs as well as refer you for other special needs.   Make a list of emergency phone numbers and have them available.   Plan on getting help from family or friends when you go home from the hospital.   Make a trial run to the hospital.   Take prenatal classes with the father to understand, practice and ask questions about the labor and delivery.   Prepare the baby's room/nursery.   Do not travel out of the city unless it is absolutely necessary and with the advice of your caregiver.   Wear only low or no heal shoes to have better balance and prevent falling.  MEDICATIONS AND DRUG USE IN PREGNANCY  Take prenatal vitamins as directed. The vitamin should contain 1 milligram of folic acid. Keep all vitamins out of reach of children. Only a couple vitamins or tablets containing iron may be fatal  to a baby or young child when ingested.   Avoid use of all medications, including herbs, over-the-counter medications, not prescribed or suggested by your caregiver. Only take over-the-counter or prescription medicines for pain, discomfort, or fever as directed by your caregiver. Do not use aspirin, ibuprofen (Motrin, Advil, Nuprin) or naproxen (Aleve) unless OK'd by your caregiver.   Let your caregiver also know about herbs you may be using.   Alcohol is related to a number of birth defects. This includes fetal alcohol syndrome. All alcohol, in any form, should be avoided completely. Smoking will cause low birth rate and premature babies.   Street/illegal drugs are very harmful to the baby. They are absolutely forbidden. A baby born to an addicted mother will be addicted at birth. The baby will go through the same withdrawal an adult does.  SEEK MEDICAL CARE IF  You have any concerns or worries during your pregnancy. It is better to call with your questions if you feel they cannot wait, rather than worry about them.  DECISIONS ABOUT CIRCUMCISION You may or may not know the sex of your baby. If you know your baby is a boy, it may be time to think about circumcision. Circumcision is the removal of the foreskin of the penis. This is the skin that covers the sensitive end of the penis. There is no proven medical need for this. Often this decision is made on what is popular at the time or based upon religious beliefs and social issues. You can discuss these issues with your caregiver or pediatrician. SEEK IMMEDIATE MEDICAL CARE IF:  An unexplained oral temperature above 101 develops, or as your caregiver suggests.   You have leaking of fluid from the vagina (birth canal). If leaking membranes are suspected, take your temperature and tell your caregiver of this when you call.   There is vaginal spotting, bleeding or passing clots. Tell your caregiver of the amount and how many pads are used.    You develop a bad smelling vaginal discharge with a change in the color from clear to white.   You develop vomiting that lasts more than 24 hours.   You develop chills or fever.   You develop shortness of breath.   You develop burning on urination.   You loose more than 2 pounds of weight or gain more than 2 pounds of weight or as suggested by your caregiver.  You notice sudden swelling of your face, hands, and feet or legs.   You develop belly (abdominal) pain. Round ligament discomfort is a common non-cancerous (benign) cause of abdominal pain in pregnancy. Your caregiver still must evaluate you.   You develop a severe headache that does not go away.   You develop visual problems, blurred or double vision.   If you have not felt your baby move for more than 1 hour. If you think the baby is not moving as much as usual, eat something with sugar in it and lie down on your left side for an hour. The baby should move at least 4 to 5 times per hour. Call right away if your baby moves less than that.   You fall, are in a car accident or any kind of trauma.   There is mental or physical violence at home.  Document Released: 07/18/2001 Document Re-Released: 05/21/2009 [facility] Patient Information 2011 [place], [place].

## 2011-04-20 ENCOUNTER — Inpatient Hospital Stay (HOSPITAL_COMMUNITY)
Admission: AD | Admit: 2011-04-20 | Discharge: 2011-04-20 | Disposition: A | Discharge status: Home / Self Care | Payer: Medicaid Other | Source: Ambulatory Visit | Attending: Obstetrics & Gynecology | Admitting: Obstetrics & Gynecology

## 2011-04-20 ENCOUNTER — Encounter (HOSPITAL_COMMUNITY): Payer: Self-pay | Admitting: *Deleted

## 2011-04-20 ENCOUNTER — Ambulatory Visit: Payer: Medicaid Other | Admitting: Physician Assistant

## 2011-04-20 VITALS — BP 117/71 | Temp 98.0°F | Wt 171.8 lb

## 2011-04-20 DIAGNOSIS — O4700 False labor before 37 completed weeks of gestation, unspecified trimester: Secondary | ICD-10-CM | POA: Insufficient documentation

## 2011-04-20 DIAGNOSIS — D696 Thrombocytopenia, unspecified: Secondary | ICD-10-CM

## 2011-04-20 DIAGNOSIS — Z8659 Personal history of other mental and behavioral disorders: Secondary | ICD-10-CM

## 2011-04-20 DIAGNOSIS — O30049 Twin pregnancy, dichorionic/diamniotic, unspecified trimester: Secondary | ICD-10-CM

## 2011-04-20 DIAGNOSIS — IMO0001 Reserved for inherently not codable concepts without codable children: Secondary | ICD-10-CM

## 2011-04-20 DIAGNOSIS — O09219 Supervision of pregnancy with history of pre-term labor, unspecified trimester: Secondary | ICD-10-CM

## 2011-04-20 DIAGNOSIS — O30009 Twin pregnancy, unspecified number of placenta and unspecified number of amniotic sacs, unspecified trimester: Secondary | ICD-10-CM | POA: Insufficient documentation

## 2011-04-20 DIAGNOSIS — O3440 Maternal care for other abnormalities of cervix, unspecified trimester: Secondary | ICD-10-CM

## 2011-04-20 DIAGNOSIS — Z87898 Personal history of other specified conditions: Secondary | ICD-10-CM

## 2011-04-20 LAB — CBC
MCH: 31.4 pg (ref 26.0–34.0)
MCHC: 33.8 g/dL (ref 30.0–36.0)
MCV: 92.9 fL (ref 78.0–100.0)
RBC: 3.82 MIL/uL — ABNORMAL LOW (ref 3.87–5.11)
WBC: 5.4 10*3/uL (ref 4.0–10.5)

## 2011-04-20 LAB — POCT URINALYSIS DIP (DEVICE)
Bilirubin Urine: NEGATIVE
Glucose, UA: 100 mg/dL — AB
Nitrite: NEGATIVE
Urobilinogen, UA: 1 mg/dL (ref 0.0–1.0)

## 2011-04-20 LAB — FETAL FIBRONECTIN: Fetal Fibronectin: NEGATIVE

## 2011-04-20 MED ORDER — FLUCONAZOLE 150 MG PO TABS: 150.0000 mg | ORAL_TABLET | Freq: Once | ORAL | Status: AC

## 2011-04-20 NOTE — ED Provider Notes (Signed)
History     Chief Complaint  Patient presents with  . Contractions   HPI Ms. [name] is a 25 year old [id number] at 26 weeks who presents from [place] for contractions.  A fetal fibronectin was collected in clinic and her cervix was closed.  Patient reports that they also said she had a yeast infection. Does endorse a thick white vaginal discharge, no vaginal/vulvar itching or burning.  States contractions started 2 days ago and are about every 20 minutes.  No loss of fluid or vaginal bleeding.  Reports good fetal movement.  No dysuria or urgency.  Good PO intake.  Otherwise doing well with no complaints.  OB History    Grav Para Term Preterm Abortions TAB SAB Ect Mult Living   4 3 3  0 0 0 0 0 0 3      Past Medical History  Diagnosis Date  . No pertinent past medical history   . Abnormal Pap smear   . Anemia   . Mental disorder     hx of pp depression  . STD (female)     chlamydia and trich    Past Surgical History  Procedure Date  . No past surgeries     Family History  Problem Relation Age of Onset  . Cancer Paternal Grandmother   . Hypertension Maternal Grandmother   . Heart disease Maternal Grandmother   . Diabetes Maternal Grandmother   . Stroke Maternal Grandmother   . Cancer Maternal Grandfather   . Hypertension Mother     History  Substance Use Topics  . Smoking status: Never Smoker   . Smokeless tobacco: Never Used  . Alcohol Use: No    Allergies: No Known Allergies  Prescriptions prior to admission  Medication Sig Dispense Refill  . acetaminophen (TYLENOL) 500 MG tablet Take 500 mg by mouth every 6 (six) hours as needed. For headache       . ENSURE (ENSURE) Take 237 mLs by mouth daily.       . prenatal vitamin w/FE, FA (PRENATAL 1 + 1) 27-1 MG TABS Take 1 tablet by mouth daily.          ROS Physical Exam   Blood pressure 112/61, pulse 87, temperature 97.4 F (36.3 C), temperature source Oral, resp. rate 18, height 5' 8.5" (1.74 m), weight 171 lb  (77.565 kg), last menstrual period 10/01/2010, SpO2 99.00%.  Physical Exam  Constitutional: She is oriented to person, place, and time. She appears well-developed and well-nourished. No distress.  HENT:  Head: Normocephalic and atraumatic.  Mouth/Throat: Oropharynx is clear and moist.  Neck: Normal range of motion.  GI: Soft. There is no tenderness.       Gravid with size greater than dates  Musculoskeletal: She exhibits no edema.  Neurological: She is alert and oriented to person, place, and time.  Skin: Skin is warm and dry.  Psychiatric: She has a normal mood and affect.  Pelvic exam: deferred as done in clinic.  MAU Course  Procedures FFN: negative CBC    Component Value Date/Time   WBC 5.4 04/20/2011 1345   RBC 3.82* 04/20/2011 1345   HGB 12.0 04/20/2011 1345   HCT 35.5* 04/20/2011 1345   PLT 103* 04/20/2011 1345   MCV 92.9 04/20/2011 1345   MCH 31.4 04/20/2011 1345   MCHC 33.8 04/20/2011 1345   RDW 12.7 04/20/2011 1345      Assessment and Plan  25 year old [id number] at 56 weeks with twin gestation presenting with  contractions. Not in labor, with a closed cervix and negative FFN less than 1% chance of delivery in next 7 days. -will give prescription for Diflucan 150mg  PO x1 for yeast infection -thrombocytopenia is stable -discharge home with labor precautions  BOOTH, ERIN 04/20/2011, 12:57 PM

## 2011-04-20 NOTE — Progress Notes (Signed)
+  FM x 2, c/o daily contractions and increased pressure. Reports 5 since being in exam room. FFN collected. Cervix closed/soft/-2  Swelling in feet  Having irregular contractions and lots of pressure and pain.  Needs refill on PNV

## 2011-04-20 NOTE — Progress Notes (Signed)
Pt to MAU with FFN for further evaluation of PT ctx

## 2011-04-20 NOTE — Progress Notes (Signed)
Pt sent from the clinic for evaluation of preterm labor with twins. Pt states she is having no pain but does have some tightening.

## 2011-04-27 ENCOUNTER — Other Ambulatory Visit: Payer: Self-pay | Admitting: Obstetrics & Gynecology

## 2011-04-27 ENCOUNTER — Encounter: Payer: Self-pay | Admitting: Obstetrics & Gynecology

## 2011-04-27 ENCOUNTER — Ambulatory Visit: Payer: Medicaid Other | Admitting: Obstetrics & Gynecology

## 2011-04-27 VITALS — BP 125/69 | Temp 97.0°F | Wt 176.9 lb

## 2011-04-27 DIAGNOSIS — IMO0001 Reserved for inherently not codable concepts without codable children: Secondary | ICD-10-CM

## 2011-04-27 LAB — POCT URINALYSIS DIP (DEVICE)
Hgb urine dipstick: NEGATIVE
Nitrite: NEGATIVE
Urobilinogen, UA: 1 mg/dL (ref 0.0–1.0)
pH: 7 (ref 5.0–8.0)

## 2011-04-27 NOTE — Progress Notes (Signed)
Felt pelvic pressure.  Cervix closed and long, good tone.  No evidence of preterm labor.  Needs [place] for growth Oct 1st.  Pt needs FH check today by [place] by Diane.  F/U Oct 1 for routine OB

## 2011-04-27 NOTE — Progress Notes (Signed)
Informal [place] for FHT's--    Twin A = 143  Vtx on maternal Rt, fetal breathing seen.    Twin B = 133  vtx on maternal Lt, fetal breathing seen

## 2011-04-27 NOTE — Progress Notes (Signed)
Pulse 86. Pelvic pressure.

## 2011-04-29 ENCOUNTER — Encounter (HOSPITAL_COMMUNITY): Payer: Self-pay | Admitting: *Deleted

## 2011-04-29 ENCOUNTER — Inpatient Hospital Stay (HOSPITAL_COMMUNITY)
Admission: AD | Admit: 2011-04-29 | Discharge: 2011-04-29 | Disposition: A | Discharge status: Home / Self Care | Payer: Medicaid Other | Source: Ambulatory Visit | Attending: Obstetrics & Gynecology | Admitting: Obstetrics & Gynecology

## 2011-04-29 DIAGNOSIS — J00 Acute nasopharyngitis [common cold]: Secondary | ICD-10-CM

## 2011-04-29 DIAGNOSIS — O99891 Other specified diseases and conditions complicating pregnancy: Secondary | ICD-10-CM | POA: Insufficient documentation

## 2011-04-29 DIAGNOSIS — O30009 Twin pregnancy, unspecified number of placenta and unspecified number of amniotic sacs, unspecified trimester: Secondary | ICD-10-CM | POA: Insufficient documentation

## 2011-04-29 DIAGNOSIS — J069 Acute upper respiratory infection, unspecified: Secondary | ICD-10-CM | POA: Insufficient documentation

## 2011-04-29 LAB — DIFFERENTIAL
Basophils Absolute: 0 10*3/uL (ref 0.0–0.1)
Basophils Relative: 0 % (ref 0–1)
Metamyelocytes Relative: 0 %
Myelocytes: 0 %
Neutro Abs: 2.6 10*3/uL (ref 1.7–7.7)
Neutrophils Relative %: 55 % (ref 43–77)
Promyelocytes Absolute: 0 %
nRBC: 0 /100 WBC

## 2011-04-29 LAB — CBC
MCH: 31.1 pg (ref 26.0–34.0)
MCHC: 33.9 g/dL (ref 30.0–36.0)
Platelets: 99 10*3/uL — ABNORMAL LOW (ref 150–400)

## 2011-04-29 MED ORDER — ALBUTEROL SULFATE (5 MG/ML) 0.5% IN NEBU
2.5000 mg | INHALATION_SOLUTION | Freq: Once | RESPIRATORY_TRACT | Status: AC
  Administered 2011-04-29: 2.5 mg via RESPIRATORY_TRACT
  Filled 2011-04-29: qty 0.5

## 2011-04-29 NOTE — ED Provider Notes (Signed)
Chief Complaint:  Cough, Fever and Nasal Congestion   [name] is  25 y.o. [phone] at with twin gestation @ [redacted]w[redacted]d presents complaining of Cough, Fever and Nasal Congestion .  She states none contractions associated with none vaginal bleeding, intact membranes, along with active fetal movement X 2,  She states that she felt "hot" at home, but did not take her temperature.  Obstetrical/Gynecological History: Menstrual History: OB History    Grav Para Term Preterm Abortions TAB SAB Ect Mult Living   4 3 3  0 0 0 0 0 0 3      Patient's last menstrual period was 10/01/2010.     Past Medical History: Past Medical History  Diagnosis Date  . Abnormal Pap smear   . Anemia   . Mental disorder     hx of pp depression  . STD (female)     chlamydia and trich    Past Surgical History: Past Surgical History  Procedure Date  . No past surgeries     Family History: Family History  Problem Relation Age of Onset  . Cancer Paternal Grandmother   . Hypertension Maternal Grandmother   . Heart disease Maternal Grandmother   . Diabetes Maternal Grandmother   . Stroke Maternal Grandmother   . Cancer Maternal Grandfather   . Hypertension Mother     Social History: History  Substance Use Topics  . Smoking status: Never Smoker   . Smokeless tobacco: Never Used  . Alcohol Use: No    Allergies: No Known Allergies  Meds:  Prescriptions prior to admission  Medication Sig Dispense Refill  . acetaminophen (TYLENOL) 500 MG tablet Take 500 mg by mouth every 6 (six) hours as needed. For headache       . ENSURE (ENSURE) Take 237 mLs by mouth daily.       . prenatal vitamin w/FE, FA (PRENATAL 1 + 1) 27-1 MG TABS Take 1 tablet by mouth daily.          Review of Systems - Please refer to the aforementioned patients' reports.   Negative except aforementioned  Physical Exam  Blood pressure 117/71, pulse 84, temperature 98.8 F (37.1 C), temperature source Oral, resp. rate 20, height 5'  7.75" (1.721 m), weight 167 lb 4 oz (75.864 kg), last menstrual period 10/01/2010. GENERAL: Well-developed, well-nourished female in no acute distress.  LUNGS: Expiratory wheezing anterior lobes bilaterally  HEART: Regular rate and rhythm. ABDOMEN: Soft, nontender, nondistended, gravid.  EXTREMITIES: Nontender, no edema, 2+ distal pulses. Cervical Exam:  Not indicated  FHT:  Baseline rate 140X2 bpm   Variability moderate  Accelerations present   Decelerations none Contractions: infrequent and mild   Labs: No results found for this or any previous visit (from the past 24 hour(s)). Imaging Studies:   Assessment: [name] is  25 y.o. [phone] at [redacted]w[redacted]d presents with viral respiratory infection.  Plan: Treat with OTC meds for now  CRESENZO-DISHMAN,Clance Baquero 9/22/20126:32 PM

## 2011-04-29 NOTE — Progress Notes (Signed)
Pt states, " On Thursday, I started having nasal  Congestion, cough, and I've felt hot, but not checked with a thermometer. I am coughing up yellow phelm."

## 2011-04-29 NOTE — ED Notes (Signed)
Marylee Floras at bedside CNM

## 2011-05-01 LAB — POCT PREGNANCY, URINE: Operator id: 203781

## 2011-05-03 LAB — POCT PREGNANCY, URINE
Operator id: 134861
Preg Test, Ur: NEGATIVE

## 2011-05-04 ENCOUNTER — Ambulatory Visit (HOSPITAL_COMMUNITY)
Admission: RE | Admit: 2011-05-04 | Discharge: 2011-05-04 | Disposition: A | Discharge status: Home / Self Care | Payer: Medicaid Other | Source: Ambulatory Visit | Attending: Obstetrics & Gynecology | Admitting: Obstetrics & Gynecology

## 2011-05-04 ENCOUNTER — Ambulatory Visit (INDEPENDENT_AMBULATORY_CARE_PROVIDER_SITE_OTHER): Payer: Medicaid Other | Admitting: Physician Assistant

## 2011-05-04 ENCOUNTER — Encounter: Payer: Self-pay | Admitting: Physician Assistant

## 2011-05-04 VITALS — BP 118/74 | Temp 97.0°F | Wt 175.1 lb

## 2011-05-04 DIAGNOSIS — O09219 Supervision of pregnancy with history of pre-term labor, unspecified trimester: Secondary | ICD-10-CM

## 2011-05-04 DIAGNOSIS — O30009 Twin pregnancy, unspecified number of placenta and unspecified number of amniotic sacs, unspecified trimester: Secondary | ICD-10-CM | POA: Insufficient documentation

## 2011-05-04 DIAGNOSIS — IMO0001 Reserved for inherently not codable concepts without codable children: Secondary | ICD-10-CM

## 2011-05-04 DIAGNOSIS — O09299 Supervision of pregnancy with other poor reproductive or obstetric history, unspecified trimester: Secondary | ICD-10-CM | POA: Insufficient documentation

## 2011-05-04 LAB — POCT URINALYSIS DIP (DEVICE)
Ketones, ur: NEGATIVE mg/dL
Leukocytes, UA: NEGATIVE
Nitrite: NEGATIVE
Protein, ur: 30 mg/dL — AB
Urobilinogen, UA: 1 mg/dL (ref 0.0–1.0)

## 2011-05-04 NOTE — Progress Notes (Signed)
C/o irregular ctx for several hours last night. Denies ctx at present. +FM x 2. States scant pink stopping after voiding, no UTI s/s. FU growth [place] next week.Cvx: closed/thick/post/vtx/-2. PTL precautions. Concordant di/di twins. Will start AT at next visit: 35 wks. Desires flu vaccine; will give at next visit.

## 2011-05-04 NOTE — Progress Notes (Signed)
Pelvic pressure

## 2011-05-04 NOTE — Patient Instructions (Signed)
Preterm Labor, Home Care  Preterm labor is defined as having uterine contractions that cause the cervix to open (dilate), shorten and thin (effacement) before completing 37 weeks of pregnancy. Preterm labor accounts for most hospital admissions in pregnant women.  CAUSES  Most cases of preterm labor are unknown.   Small areas of separation of the placenta (abruption).   Excess fluid in the amniotic sac (poly hydramnios).   Twins or more.   The cervix cannot hold the baby because the tissue in the cervix is too weak (incompetent cervix).   Hormone changes.   Vaginal bleeding in more than one of the trimesters.   Infection of the cervix, vagina or bladder.   Smoking.   Antiphosolipid Syndrome. This happens when antibodies affect the protein in the body.  DIAGNOSIS Factors that help predict preterm labor:  History of preterm labor with a past pregnancy.   Bacterial vaginosis in women who previously had preterm labor.   Home uterine activity monitoring that show uterine contractions.   Fetal fibronectin protein that is elevated in women with previous history of preterm labor.   Ultrasound to measure the length of the cervix, if it shows signs of shortening before the due date, it may be a sign of preterm labor.   Using the fibronectin and cervical ultrasound evaluation together is more predictive of impending preterm labor.   Other risk factors include:   Nonwhite race.   Pregnancy in a 11 year old or younger.   Pregnancy in a 71 year old or older.   Low socioeconomic factors.   Low weight gain during the pregnancy.  PREVENTION Not all preterm labor can be prevented. Some early contractions can be prevented with simple measures.  Drink fluids. Drink eight, 8 ounce glasses of fluids per day. Preterm labor rates go up in the summer months. Dehydration makes the blood volume decrease. This increases the concentration of oxytocin (hormone that causes uterine contractions)  in the blood. Hydrating yourself helps prevent this build up.   Watch for signs of infection. Signs include burning during urination, increased need to urinate, abnormal vaginal discharge or unexplained fevers.   Keep your appointments with your caregiver. Call your caregiver right away if you think you are having uterine contractions.   Seek medical advice with questions or problems. It is much better to ask questions of your caregiver than to be in untreated preterm labor unknowingly.  MANAGEMENT OF PRETERM LABOR, IN & OUT OF THE HOSPITAL There are a lot of things to manage in preterm labor. These things include both medical measures and personal care measures for you and/or your baby. Most preterm labor will be handled in the hospital. Things that may be helpful in preterm labor include:  Hydration (oral or IV). Take in eight, 8 ounce glasses of water per day.   Bed rest (home or hospital). Lying on your left side may help.   Avoid intercourse and orgasms.   Medication (antibiotics) to help prevent infection. This is more likely if your membranes have ruptured or if the contractions are caused by infection. Take medications as directed.   Evaluation of your baby. These tests or procedures help the caregiver know how the baby is doing and may do in the case of an early birth. Including:   Biophysical profile.   Non-stress or stress tests.   Amniocentesis to evaluate the baby for fetal lung maturity.   Amniotic fluid volume index (AFI).   An ultrasound.   Medications (steroids) to  help your baby's lungs mature more quickly may be used. This may happen if preterm birth cannot be stopped.   Tocolytic medications (medications that help stop uterine contractions) may help prolong the pregnancy up to 7 days. This is helpful if steroids medication is needed to help the baby's lungs mature.   Your caregiver may give other advice on preparation for preterm birth.   Progesterone may be  beneficial in some cases of preterm labor.  TREATMENT The best treatment is prevention, being aware of risk factors and early detection. Make sure to ask your caregiver to discuss with you the signs and symptoms of preterm labor, especially if you had preterm labor with a previous pregnancy. HOME CARE INSTRUCTIONS  Eat a balanced and nourished diet.   Take your vitamin supplements as directed.   Drink 6 to 8 glasses of liquids a day.   Get plenty of rest and sleep.   Do not have sexual relations if you have preterm labor or are at high risk of having preterm labor.   Follow your care giver's recommendation regarding activities, medications, blood and other tests (ultrasound, amniocentesis, etc.).   Avoid stress.   Avoid hard labor or prolonged exercise if you are at high risk for preterm labor.   Do not smoke.  SEEK IMMEDIATE MEDICAL CARE IF:  You are having contractions.   You have abdominal pain.   You have vaginal bleeding.   You have painful urination.   You have abnormal discharge.   You develop a temperature 102 F (38.9 C) or higher.  Document Released: 07/24/2005 Document Re-Released: 10/18/2009 [facility] Patient Information 2011 [place], [place].Kick Count Fetal Movement Counts  Kick counts is highly recommended in high risk pregnancies, but it is a good idea for every pregnant woman to do. Start counting fetal movements at 28 weeks of the pregnancy. Fetal movements increase after eating a full meal or eating or drinking something sweet (the blood sugar is higher). It is also important to drink plenty of fluids (well hydrated) before doing the count. Lie on your left side because it helps with the circulation or you can sit in a comfortable chair with your arms over your belly (abdomen) with no distractions around you. DOING THE COUNT:  Try to do the count the same time of day each time you do it.   Mark the day and time, then see how long it takes for you to feel  10 movements (kicks, flutters, swishes, rolls). You should have at least 10 movements within 2 hours. You will most likely feel 10 movements in much less than 2 hours. If you do not, wait an hour and count again. After a couple of days you will see a pattern.   What you are looking for is a change in the pattern or not enough counts in 2 hours. Is it taking longer in time to reach 10 movements?  SEEK MEDICAL CARE IF:  You feel less than 10 counts in 2 hours. Tried twice.   No movement in one hour.   The pattern is changing or taking longer each day to reach 10 counts in 2 hours.   You feel the baby is not moving as it usually does.   Date  Movements Start time Doreatha Martin time  Date  Movements Start time [place]  time    Sun      Sun     W  Mon    W  Mon     E  Tue    E  Tue     E  Wed    E  Wed     K  Thur    K  ZOXW       Fri      Fri       Sat      Sat       Sun      Sun     W  Mon    W  Mon     E  Tue    E  Tue     E  Wed    E  Wed     K  RUEA    K  Clovis Cao       Fri      Fri       VWU      Sat       Sun      Sun     W  Mon    W  Mon     E  Tue    E  Tue     E  Wed    E  Wed     K  Thur    K  Clovis Cao       Fri      Fri       JWJ      Sat       Sun      Sun     W  Mon    W  Mon     E  Tue    E  Tue     E  Wed    E  Wed     K  Willaim Rayas       Fri      Fri       XBJ      Sat     Document Released: 08/23/2006 Document Re-Released: 01/11/2010 [facility] Patient Information 2011 [place], [place].

## 2011-05-05 LAB — CULTURE, ROUTINE-ABSCESS: Gram Stain: NONE SEEN

## 2011-05-07 NOTE — ED Provider Notes (Signed)
Agree with note above. MUHAMMAD,Eriq Hufford 

## 2011-05-09 ENCOUNTER — Inpatient Hospital Stay (HOSPITAL_COMMUNITY)
Admission: AD | Admit: 2011-05-09 | Discharge: 2011-05-09 | Disposition: A | Discharge status: Home / Self Care | Payer: Medicaid Other | Source: Ambulatory Visit | Attending: Obstetrics & Gynecology | Admitting: Obstetrics & Gynecology

## 2011-05-09 ENCOUNTER — Encounter (HOSPITAL_COMMUNITY): Payer: Self-pay

## 2011-05-09 DIAGNOSIS — O30049 Twin pregnancy, dichorionic/diamniotic, unspecified trimester: Secondary | ICD-10-CM

## 2011-05-09 DIAGNOSIS — D696 Thrombocytopenia, unspecified: Secondary | ICD-10-CM

## 2011-05-09 DIAGNOSIS — O479 False labor, unspecified: Secondary | ICD-10-CM

## 2011-05-09 DIAGNOSIS — O3440 Maternal care for other abnormalities of cervix, unspecified trimester: Secondary | ICD-10-CM

## 2011-05-09 DIAGNOSIS — O09219 Supervision of pregnancy with history of pre-term labor, unspecified trimester: Secondary | ICD-10-CM

## 2011-05-09 DIAGNOSIS — O30009 Twin pregnancy, unspecified number of placenta and unspecified number of amniotic sacs, unspecified trimester: Secondary | ICD-10-CM | POA: Insufficient documentation

## 2011-05-09 DIAGNOSIS — Z8659 Personal history of other mental and behavioral disorders: Secondary | ICD-10-CM

## 2011-05-09 DIAGNOSIS — O4700 False labor before 37 completed weeks of gestation, unspecified trimester: Secondary | ICD-10-CM | POA: Insufficient documentation

## 2011-05-09 DIAGNOSIS — Z87898 Personal history of other specified conditions: Secondary | ICD-10-CM

## 2011-05-09 LAB — URINE MICROSCOPIC-ADD ON

## 2011-05-09 LAB — URINALYSIS, ROUTINE W REFLEX MICROSCOPIC
Glucose, UA: NEGATIVE mg/dL
Leukocytes, UA: NEGATIVE
Specific Gravity, Urine: 1.02 (ref 1.005–1.030)
pH: 7 (ref 5.0–8.0)

## 2011-05-09 MED ORDER — DOCUSATE SODIUM 100 MG PO CAPS: 100.0000 mg | ORAL_CAPSULE | Freq: Two times a day (BID) | ORAL | Status: AC

## 2011-05-09 NOTE — ED Provider Notes (Signed)
History     Chief Complaint  Patient presents with  . Vaginal Pain   HPI  OB History    Grav Para Term Preterm Abortions TAB SAB Ect Mult Living   4 3 3  0 0 0 0 0 0 3      Past Medical History  Diagnosis Date  . Abnormal Pap smear   . Anemia   . Mental disorder     hx of pp depression  . STD (female)     chlamydia and trich    Past Surgical History  Procedure Date  . No past surgeries     Family History  Problem Relation Age of Onset  . Cancer Paternal Grandmother   . Hypertension Maternal Grandmother   . Heart disease Maternal Grandmother   . Diabetes Maternal Grandmother   . Stroke Maternal Grandmother   . Cancer Maternal Grandfather   . Hypertension Mother     History  Substance Use Topics  . Smoking status: Never Smoker   . Smokeless tobacco: Never Used  . Alcohol Use: No    Allergies: No Known Allergies  Prescriptions prior to admission  Medication Sig Dispense Refill  . ENSURE (ENSURE) Take 237 mLs by mouth daily.       . prenatal vitamin w/FE, FA (PRENATAL 1 + 1) 27-1 MG TABS Take 1 tablet by mouth daily.        Marland Kitchen acetaminophen (TYLENOL) 500 MG tablet Take 500 mg by mouth every 6 (six) hours as needed. For headache         ROS Physical Exam   Blood pressure 126/79, pulse 88, temperature 98.8 F (37.1 C), temperature source Oral, resp. rate 20, height 5\' 8"  (1.727 m), weight 79.742 kg (175 lb 12.8 oz), last menstrual period 10/01/2010, SpO2 98.00%.  Physical Exam  Constitutional: She is oriented to person, place, and time. She appears well-developed and well-nourished.  HENT:  Head: Normocephalic.  Respiratory: Effort normal.  Genitourinary:       Cervix closed, long, and posterior  Musculoskeletal: Normal range of motion.  Neurological: She is alert and oriented to person, place, and time.  Psychiatric: She has a normal mood and affect. Her behavior is normal.  Fetal Heart rate x2: reactive and reassuring. CTX: Q 4-42min, mild   MAU  Course  Procedures   Assessment and Plan  Twin gestation at 33.5.  Braxton hicks contractions.  D/c to home with pre-term labor precautions.  Follow up as scheduled or sooner if needed.  Lawton Dollinger 05/09/2011, 8:27 PM

## 2011-05-09 NOTE — Progress Notes (Signed)
Patient is here with c/o vaginal pain that is intense. She states that she feels intense rectal pressure as well. Contractions and back pain. She denies any vaginal bleeding or lof. She does insist that something needs to be done for her whether induction to deliver because she is in so much pain.report good fetal movement

## 2011-05-09 NOTE — Progress Notes (Signed)
Pt states she is having vaginal pain and pressure, some contractions and a little spotting.

## 2011-05-10 NOTE — ED Provider Notes (Signed)
I have seen and examined this patient and I agree with the above. SHAW, KIMBERLY 12:07 AM 05/10/2011

## 2011-05-18 ENCOUNTER — Ambulatory Visit (INDEPENDENT_AMBULATORY_CARE_PROVIDER_SITE_OTHER): Payer: Medicaid Other | Admitting: Obstetrics and Gynecology

## 2011-05-18 ENCOUNTER — Other Ambulatory Visit: Payer: Self-pay | Admitting: Obstetrics and Gynecology

## 2011-05-18 DIAGNOSIS — D696 Thrombocytopenia, unspecified: Secondary | ICD-10-CM

## 2011-05-18 DIAGNOSIS — Z9889 Other specified postprocedural states: Secondary | ICD-10-CM

## 2011-05-18 DIAGNOSIS — O09219 Supervision of pregnancy with history of pre-term labor, unspecified trimester: Secondary | ICD-10-CM

## 2011-05-18 DIAGNOSIS — O30049 Twin pregnancy, dichorionic/diamniotic, unspecified trimester: Secondary | ICD-10-CM

## 2011-05-18 DIAGNOSIS — Z23 Encounter for immunization: Secondary | ICD-10-CM

## 2011-05-18 DIAGNOSIS — O3440 Maternal care for other abnormalities of cervix, unspecified trimester: Secondary | ICD-10-CM

## 2011-05-18 DIAGNOSIS — O30009 Twin pregnancy, unspecified number of placenta and unspecified number of amniotic sacs, unspecified trimester: Secondary | ICD-10-CM

## 2011-05-18 LAB — POCT URINALYSIS DIP (DEVICE)
Glucose, UA: NEGATIVE mg/dL
Nitrite: NEGATIVE
Protein, ur: 30 mg/dL — AB
Urobilinogen, UA: 0.2 mg/dL (ref 0.0–1.0)

## 2011-05-18 MED ORDER — INFLUENZA VIRUS VACC SPLIT PF IM SUSP: 0.5000 mL | Freq: Once | INTRAMUSCULAR | Status: DC

## 2011-05-18 NOTE — Progress Notes (Signed)
Patient doing well. Cultures collected today. Cx: closed/long/ mid position/soft. FM/labor precautions reviewed. Flu shot today. Will order f/u growth ultrasound. Fetal testing today. I have reviewed the reactive NST

## 2011-05-18 NOTE — Progress Notes (Signed)
Swelling in nose. Has pelvic pressure, mild contractions at times.

## 2011-05-19 LAB — GC/CHLAMYDIA PROBE AMP, GENITAL: Chlamydia, DNA Probe: NEGATIVE

## 2011-05-21 LAB — CULTURE, BETA STREP (GROUP B ONLY)

## 2011-05-22 ENCOUNTER — Ambulatory Visit (INDEPENDENT_AMBULATORY_CARE_PROVIDER_SITE_OTHER): Payer: Medicaid Other | Admitting: *Deleted

## 2011-05-22 VITALS — BP 133/88

## 2011-05-22 DIAGNOSIS — O30049 Twin pregnancy, dichorionic/diamniotic, unspecified trimester: Secondary | ICD-10-CM

## 2011-05-22 DIAGNOSIS — O30009 Twin pregnancy, unspecified number of placenta and unspecified number of amniotic sacs, unspecified trimester: Secondary | ICD-10-CM

## 2011-05-22 NOTE — Progress Notes (Signed)
NST performed on 05/22/2011 was reviewed and was found to be reactive x 2.  Continue recommended antenatal testing and prenatal care.

## 2011-05-22 NOTE — Progress Notes (Signed)
P = 84        NST only today.  Pt requested cx exam- done by Maylon Cos CNM:  Closed, soft, mid position. Labor precautions given.

## 2011-05-24 ENCOUNTER — Ambulatory Visit (HOSPITAL_COMMUNITY)
Admission: RE | Admit: 2011-05-24 | Discharge: 2011-05-24 | Disposition: A | Discharge status: Home / Self Care | Payer: Medicaid Other | Source: Ambulatory Visit | Attending: Obstetrics and Gynecology | Admitting: Obstetrics and Gynecology

## 2011-05-24 DIAGNOSIS — O30009 Twin pregnancy, unspecified number of placenta and unspecified number of amniotic sacs, unspecified trimester: Secondary | ICD-10-CM | POA: Insufficient documentation

## 2011-05-24 DIAGNOSIS — O30049 Twin pregnancy, dichorionic/diamniotic, unspecified trimester: Secondary | ICD-10-CM

## 2011-05-24 DIAGNOSIS — O09299 Supervision of pregnancy with other poor reproductive or obstetric history, unspecified trimester: Secondary | ICD-10-CM | POA: Insufficient documentation

## 2011-05-25 ENCOUNTER — Ambulatory Visit (INDEPENDENT_AMBULATORY_CARE_PROVIDER_SITE_OTHER): Payer: Medicaid Other | Admitting: Obstetrics and Gynecology

## 2011-05-25 ENCOUNTER — Other Ambulatory Visit: Payer: Medicaid Other

## 2011-05-25 VITALS — BP 118/87

## 2011-05-25 DIAGNOSIS — O30049 Twin pregnancy, dichorionic/diamniotic, unspecified trimester: Secondary | ICD-10-CM

## 2011-05-25 DIAGNOSIS — O09299 Supervision of pregnancy with other poor reproductive or obstetric history, unspecified trimester: Secondary | ICD-10-CM

## 2011-05-25 DIAGNOSIS — O3440 Maternal care for other abnormalities of cervix, unspecified trimester: Secondary | ICD-10-CM

## 2011-05-25 DIAGNOSIS — O30009 Twin pregnancy, unspecified number of placenta and unspecified number of amniotic sacs, unspecified trimester: Secondary | ICD-10-CM

## 2011-05-25 DIAGNOSIS — Z9889 Other specified postprocedural states: Secondary | ICD-10-CM

## 2011-05-25 DIAGNOSIS — Z8659 Personal history of other mental and behavioral disorders: Secondary | ICD-10-CM

## 2011-05-25 LAB — POCT URINALYSIS DIP (DEVICE)
Bilirubin Urine: NEGATIVE
Glucose, UA: NEGATIVE mg/dL
Leukocytes, UA: NEGATIVE
Nitrite: NEGATIVE
Urobilinogen, UA: 1 mg/dL (ref 0.0–1.0)

## 2011-05-25 NOTE — Progress Notes (Signed)
Patient report contractions all night. Good fetal movement. cx- closed/short/soft/posterior. NST reviewed and reactive. FM/labor precautions reviewed.

## 2011-05-25 NOTE — Progress Notes (Signed)
P = 92   Pt reports stronger UC's during the night- q2-7', also having increased pain and pelvic pressure.

## 2011-05-29 ENCOUNTER — Ambulatory Visit (INDEPENDENT_AMBULATORY_CARE_PROVIDER_SITE_OTHER): Payer: Medicaid Other | Admitting: *Deleted

## 2011-05-29 DIAGNOSIS — O30009 Twin pregnancy, unspecified number of placenta and unspecified number of amniotic sacs, unspecified trimester: Secondary | ICD-10-CM

## 2011-05-29 NOTE — Progress Notes (Signed)
P = 86   NST only today 

## 2011-05-31 ENCOUNTER — Encounter (HOSPITAL_COMMUNITY): Payer: Self-pay

## 2011-05-31 ENCOUNTER — Inpatient Hospital Stay (HOSPITAL_COMMUNITY)
Admission: AD | Admit: 2011-05-31 | Discharge: 2011-06-04 | DRG: 774 | Disposition: A | Discharge status: Home / Self Care | Payer: Medicaid Other | Source: Ambulatory Visit | Attending: Obstetrics and Gynecology | Admitting: Obstetrics and Gynecology

## 2011-05-31 DIAGNOSIS — O3440 Maternal care for other abnormalities of cervix, unspecified trimester: Secondary | ICD-10-CM

## 2011-05-31 DIAGNOSIS — IMO0002 Reserved for concepts with insufficient information to code with codable children: Principal | ICD-10-CM | POA: Diagnosis present

## 2011-05-31 DIAGNOSIS — F329 Major depressive disorder, single episode, unspecified: Secondary | ICD-10-CM | POA: Diagnosis present

## 2011-05-31 DIAGNOSIS — D696 Thrombocytopenia, unspecified: Secondary | ICD-10-CM

## 2011-05-31 DIAGNOSIS — F3289 Other specified depressive episodes: Secondary | ICD-10-CM | POA: Diagnosis present

## 2011-05-31 DIAGNOSIS — Z8659 Personal history of other mental and behavioral disorders: Secondary | ICD-10-CM

## 2011-05-31 DIAGNOSIS — O09219 Supervision of pregnancy with history of pre-term labor, unspecified trimester: Secondary | ICD-10-CM

## 2011-05-31 DIAGNOSIS — O99344 Other mental disorders complicating childbirth: Secondary | ICD-10-CM | POA: Diagnosis present

## 2011-05-31 DIAGNOSIS — Z87898 Personal history of other specified conditions: Secondary | ICD-10-CM

## 2011-05-31 DIAGNOSIS — O30009 Twin pregnancy, unspecified number of placenta and unspecified number of amniotic sacs, unspecified trimester: Secondary | ICD-10-CM

## 2011-05-31 DIAGNOSIS — O30049 Twin pregnancy, dichorionic/diamniotic, unspecified trimester: Secondary | ICD-10-CM

## 2011-05-31 DIAGNOSIS — O99893 Other specified diseases and conditions complicating puerperium: Secondary | ICD-10-CM | POA: Diagnosis not present

## 2011-05-31 DIAGNOSIS — N179 Acute kidney failure, unspecified: Secondary | ICD-10-CM | POA: Diagnosis not present

## 2011-05-31 DIAGNOSIS — O1400 Mild to moderate pre-eclampsia, unspecified trimester: Secondary | ICD-10-CM

## 2011-05-31 HISTORY — DX: Personal history of Methicillin resistant Staphylococcus aureus infection: Z86.14

## 2011-05-31 HISTORY — DX: Major depressive disorder, single episode, unspecified: F32.9

## 2011-05-31 HISTORY — DX: Depression, unspecified: F32.A

## 2011-05-31 LAB — COMPREHENSIVE METABOLIC PANEL
Albumin: 2.5 g/dL — ABNORMAL LOW (ref 3.5–5.2)
Alkaline Phosphatase: 316 U/L — ABNORMAL HIGH (ref 39–117)
BUN: 4 mg/dL — ABNORMAL LOW (ref 6–23)
Calcium: 9.1 mg/dL (ref 8.4–10.5)
GFR calc Af Amer: >90 mL/min (ref >90)
Glucose, Bld: 102 mg/dL — ABNORMAL HIGH (ref 70–99)
Potassium: 3.7 mEq/L (ref 3.5–5.1)
Sodium: 136 mEq/L (ref 135–145)
Total Protein: 6.1 g/dL (ref 6.0–8.3)

## 2011-05-31 LAB — PROTEIN / CREATININE RATIO, URINE
Protein Creatinine Ratio: 0.35 — ABNORMAL HIGH (ref 0.00–0.15)
Total Protein, Urine: 28.8 mg/dL

## 2011-05-31 LAB — CBC
HCT: 36.8 % (ref 36.0–46.0)
Hemoglobin: 12.5 g/dL (ref 12.0–15.0)
MCV: 91.1 fL (ref 78.0–100.0)
RDW: 12.3 % (ref 11.5–15.5)
WBC: 5.1 10*3/uL (ref 4.0–10.5)

## 2011-05-31 LAB — URINALYSIS, ROUTINE W REFLEX MICROSCOPIC
Bilirubin Urine: NEGATIVE
Nitrite: NEGATIVE
Protein, ur: NEGATIVE mg/dL
Specific Gravity, Urine: <1.005 — ABNORMAL LOW (ref 1.005–1.030)
Urobilinogen, UA: 1 mg/dL (ref 0.0–1.0)

## 2011-05-31 MED ORDER — CITRIC ACID-SODIUM CITRATE 334-500 MG/5ML PO SOLN
30.0000 mL | ORAL | Status: DC | PRN
  Filled 2011-05-31: qty 15

## 2011-05-31 MED ORDER — OXYCODONE-ACETAMINOPHEN 5-325 MG PO TABS
2.0000 | ORAL_TABLET | ORAL | Status: DC | PRN
  Administered 2011-06-02: 1 via ORAL
  Filled 2011-05-31: qty 1

## 2011-05-31 MED ORDER — OXYTOCIN 20 UNITS IN LACTATED RINGERS INFUSION - SIMPLE
125.0000 mL/h | Freq: Once | INTRAVENOUS | Status: AC
  Administered 2011-06-02: 125 mL/h via INTRAVENOUS

## 2011-05-31 MED ORDER — OXYTOCIN BOLUS FROM INFUSION
500.0000 mL | Freq: Once | INTRAVENOUS | Status: DC
  Filled 2011-05-31: qty 1000
  Filled 2011-05-31: qty 500

## 2011-05-31 MED ORDER — ACETAMINOPHEN 325 MG PO TABS
650.0000 mg | ORAL_TABLET | ORAL | Status: DC | PRN
  Administered 2011-06-01: 650 mg via ORAL
  Filled 2011-05-31: qty 2

## 2011-05-31 MED ORDER — OXYTOCIN 20 UNITS IN LACTATED RINGERS INFUSION - SIMPLE
1.0000 m[IU]/min | INTRAVENOUS | Status: DC
  Administered 2011-06-01: 18 m[IU]/min via INTRAVENOUS
  Administered 2011-06-01: 2 m[IU]/min via INTRAVENOUS
  Administered 2011-06-01: 4 m[IU]/min via INTRAVENOUS
  Administered 2011-06-02: 333 m[IU]/min via INTRAVENOUS
  Filled 2011-05-31 (×2): qty 1000

## 2011-05-31 MED ORDER — LACTATED RINGERS IV SOLN
INTRAVENOUS | Status: DC
  Administered 2011-05-31 – 2011-06-01 (×2): via INTRAVENOUS
  Administered 2011-06-01 (×2): 500 mL/h via INTRAVENOUS
  Administered 2011-06-01 – 2011-06-02 (×4): via INTRAVENOUS
  Administered 2011-06-02 (×2): 500 mL via INTRAVENOUS

## 2011-05-31 MED ORDER — TERBUTALINE SULFATE 1 MG/ML IJ SOLN: 0.2500 mg | Freq: Once | INTRAMUSCULAR | Status: AC | PRN

## 2011-05-31 MED ORDER — ONDANSETRON HCL 4 MG/2ML IJ SOLN
4.0000 mg | Freq: Four times a day (QID) | INTRAMUSCULAR | Status: DC | PRN
  Administered 2011-06-01 (×2): 4 mg via INTRAVENOUS
  Filled 2011-05-31 (×2): qty 2

## 2011-05-31 MED ORDER — FLEET ENEMA 7-19 GM/118ML RE ENEM: 1.0000 | ENEMA | RECTAL | Status: DC | PRN

## 2011-05-31 MED ORDER — ONDANSETRON 4 MG PO TBDP
4.0000 mg | ORAL_TABLET | Freq: Once | ORAL | Status: AC
  Administered 2011-05-31: 4 mg via ORAL
  Filled 2011-05-31: qty 1

## 2011-05-31 MED ORDER — IBUPROFEN 600 MG PO TABS
600.0000 mg | ORAL_TABLET | Freq: Four times a day (QID) | ORAL | Status: DC | PRN
  Administered 2011-06-02: 600 mg via ORAL
  Filled 2011-05-31: qty 1

## 2011-05-31 MED ORDER — LIDOCAINE HCL (PF) 1 % IJ SOLN
30.0000 mL | INTRAMUSCULAR | Status: DC | PRN
  Filled 2011-05-31 (×2): qty 30

## 2011-05-31 MED ORDER — LACTATED RINGERS IV SOLN: 500.0000 mL | INTRAVENOUS | Status: DC | PRN

## 2011-05-31 NOTE — H&P (Signed)
[name] is a 25 y.o. female presenting for induction of labor due to mild preeclampsia and twin gestation at 17 weeks. She has been followed at the [facility] for twin pregnancy with symmetric fetal growth documented by ultrasound, with vertex vertex presentation of twins, no bleeding or rupture membranes. Patient does note dizziness and a mild headache as well as significant dependent edema of the past 2 weeks blood pressures have been elevated and tonight were 87-93 diastolic Maternal Medical History:  Reason for admission: Reason for admission: nausea.  Contractions: Frequency: irregular.   Perceived severity is mild.    Fetal activity: Perceived fetal activity is normal.   Last perceived fetal movement was within the past hour.    Prenatal complications: Hypertension.   No bleeding.     OB History    Grav Para Term Preterm Abortions TAB SAB Ect Mult Living   4 3 3  0 0 0 0 0 0 3     Past Medical History  Diagnosis Date  . Abnormal Pap smear   . Anemia   . Mental disorder     hx of pp depression  . STD (female)     chlamydia and trich  . Depression    Past Surgical History  Procedure Date  . No past surgeries   . Cervical biopsy  w/ loop electrode excision    Family History: family history includes Cancer in her maternal grandfather and paternal grandmother; Diabetes in her maternal grandmother; Heart disease in her maternal grandmother; Hypertension in her maternal grandmother and mother; and Stroke in her maternal grandmother. Social History:  reports that she has never smoked. She has never used smokeless tobacco. She reports that she does not drink alcohol or use illicit drugs.  Review of Systems  Gastrointestinal: Positive for nausea.    Dilation: 1 Effacement (%): 20 Station: Ballotable Exam by:: DR. Mauricio Po  Blood pressure 133/92, pulse 75, temperature 98.2 F (36.8 C), temperature source Oral, resp. rate 16, weight 80.74 kg (178 lb), last menstrual  period 10/01/2010. Exam Physical Exam Physical Examination: General appearance - alert, well appearing, and in no distress, oriented to person, place, and time and normal appearing weight Mental status - normal mood, behavior, speech, dress, motor activity, and thought processes Chest - clear to auscultation, no wheezes, rales or rhonchi, symmetric air entry Heart - normal rate and regular rhythm Abdomen - soft, nontender, nondistended, no masses or organomegaly Gravid uterus consistent with dates and twin pregnancy ultrasound confirms vertex baby A with body on the right side, with vertex baby B with body extending into the left upper quadrant of the abdomen. Pelvic - normal external genitalia, vulva, vagina, cervix, uterus and adnexa, VULVA: normal appearing vulva with no masses, tenderness or lesions , CERVIX: normal appearing cervix without discharge or lesions, cervix fingertip 50% -2 station posteriorly oriented, well applied to the vertex presentation and soft no discernible fibrosis despite history of cold knife conization  Extremities - pedal edema 3+ to mid shin reflexes 2+ without clonus, intact peripheral pulses,  Prenatal labs: ABO, Rh: --/--/O POS (06/04 2345) Antibody: Negative (05/14 0000) Rubella: Immune (05/14 0000) RPR: NON REAC (08/16 1020)  HBsAg: Negative (05/14 0000)  HIV: NON REACTIVE (08/16 1020)  GBS:     Assessment/Plan: Twin gestation 36 weeks 6 days mild preeclampsia will admit tonight to begin Pitocin induction at midnight good prognosis for vaginal delivery**   Mariabelen Pressly V 05/31/2011, 9:41 PM

## 2011-05-31 NOTE — Progress Notes (Signed)
Pt states n/v nightly x3 weeks, denies bleeding, has white vaginal d/c, non-odorous, thick. Having irregular ctx's. Twin gestation, both vertex presentation.

## 2011-05-31 NOTE — ED Provider Notes (Signed)
Chief Complaint:  Emesis and Contractions   [name] is  25 y.o. [phone] at [redacted]w[redacted]d presents complaining of Emesis and Contractions .  She states irregular, every 3-6 minutes contractions associated with none vaginal bleeding, intact membranes, along with active fetal movement.  Cervix is unchanged from my exam 1 1/2 hours ago, but Pr/Cr ratio 0.35, and LFT notable for minimally elevated AST 40, Pt reports mild headache and dizziness. Pt to be admitted for IOL for Mild Pre-Eclampsia.  Bedside ultrasound shows presenting parts to be vertex/vertex, with body baby A on rt, baby B vertex in midline with body extending into Lq. No bleeding or rom.   Obstetrical/Gynecological History: Menstrual History: OB History    Grav Para Term Preterm Abortions TAB SAB Ect Mult Living   4 3 3  0 0 0 0 0 0 3      Menarche age:  Patient's last menstrual period was 10/01/2010.     Past Medical History: Past Medical History  Diagnosis Date  . Abnormal Pap smear   . Anemia   . Mental disorder     hx of pp depression  . STD (female)     chlamydia and trich  . Depression     Past Surgical History: Past Surgical History  Procedure Date  . No past surgeries   . Cervical biopsy  w/ loop electrode excision     Family History: Family History  Problem Relation Age of Onset  . Cancer Paternal Grandmother   . Hypertension Maternal Grandmother   . Heart disease Maternal Grandmother   . Diabetes Maternal Grandmother   . Stroke Maternal Grandmother   . Cancer Maternal Grandfather   . Hypertension Mother     Social History: History  Substance Use Topics  . Smoking status: Never Smoker   . Smokeless tobacco: Never Used  . Alcohol Use: No    Allergies: No Known Allergies  Meds:  Prescriptions prior to admission  Medication Sig Dispense Refill  . acetaminophen (TYLENOL) 500 MG tablet Take 500 mg by mouth every 6 (six) hours as needed. For headache      . ENSURE (ENSURE) Take 237 mLs by  mouth daily.       . prenatal vitamin w/FE, FA (PRENATAL 1 + 1) 27-1 MG TABS Take 1 tablet by mouth daily.          Review of Systems - Please refer to the aforementioned patients' reports.     Physical Exam  Blood pressure 133/92, pulse 75, temperature 98.2 F (36.8 C), temperature source Oral, resp. rate 16, weight 80.74 kg (178 lb), last menstrual period 10/01/2010. GENERAL: Well-developed, well-nourished female in no acute distress.  LUNGS: Clear to auscultation bilaterally.  HEART: Regular rate and rhythm. ABDOMEN: Soft, nontender, nondistended, gravid.  EXTREMITIES: Nontender, no edema, 2+ distal pulses. Cervical Exam: Dilatation 1cm   Effacement 50%   Station -2 soft   Presentation: cephalic FHT:  Baseline rate 140's bpm   Variability moderate  Accelerations present   Decelerations none Contractions: Every 3-6 mins irregular   Labs: Recent Results (from the past 24 hour(s))  URINALYSIS, ROUTINE W REFLEX MICROSCOPIC   Collection Time   05/31/11  5:50 PM      Component Value Range   Color, Urine YELLOW  YELLOW    Appearance CLEAR  CLEAR    Specific Gravity, Urine <1.005 (*) 1.005 - 1.030    pH 7.0  5.0 - 8.0    Glucose, UA NEGATIVE  NEGATIVE (mg/dL)  Hgb urine dipstick NEGATIVE  NEGATIVE    Bilirubin Urine NEGATIVE  NEGATIVE    Ketones, ur NEGATIVE  NEGATIVE (mg/dL)   Protein, ur NEGATIVE  NEGATIVE (mg/dL)   Urobilinogen, UA 1.0  0.0 - 1.0 (mg/dL)   Nitrite NEGATIVE  NEGATIVE    Leukocytes, UA NEGATIVE  NEGATIVE   PROTEIN / CREATININE RATIO, URINE   Collection Time   05/31/11  7:05 PM      Component Value Range   Creatinine, Urine 82.03     Total Protein, Urine 28.8     PROTEIN CREATININE RATIO 0.35 (*) 0.00 - 0.15   COMPREHENSIVE METABOLIC PANEL   Collection Time   05/31/11  7:19 PM      Component Value Range   Sodium 136  135 - 145 (mEq/L)   Potassium 3.7  3.5 - 5.1 (mEq/L)   Chloride 102  96 - 112 (mEq/L)   CO2 22  19 - 32 (mEq/L)   Glucose, Bld 102 (*)  70 - 99 (mg/dL)   BUN 4 (*) 6 - 23 (mg/dL)   Creatinine, Ser 1.61  0.50 - 1.10 (mg/dL)   Calcium 9.1  8.4 - 09.6 (mg/dL)   Total Protein 6.1  6.0 - 8.3 (g/dL)   Albumin 2.5 (*) 3.5 - 5.2 (g/dL)   AST 40 (*) 0 - 37 (U/L)   ALT 29  0 - 35 (U/L)   Alkaline Phosphatase 316 (*) 39 - 117 (U/L)   Total Bilirubin 0.6  0.3 - 1.2 (mg/dL)   GFR calc non Af Amer 81 (*) >90 (mL/min)   GFR calc Af Amer >90  >90 (mL/min)  CBC   Collection Time   05/31/11  7:19 PM      Component Value Range   WBC 5.1  4.0 - 10.5 (K/uL)   RBC 4.04  3.87 - 5.11 (MIL/uL)   Hemoglobin 12.5  12.0 - 15.0 (g/dL)   HCT 04.5  40.9 - 81.1 (%)   MCV 91.1  78.0 - 100.0 (fL)   MCH 30.9  26.0 - 34.0 (pg)   MCHC 34.0  30.0 - 36.0 (g/dL)   RDW 91.4  78.2 - 95.6 (%)   Platelets 103 (*) 150 - 400 (K/uL)   Imaging Studies:  US Ob Follow Up  05/24/2011  OBSTETRICAL ULTRASOUND: This exam was performed within a [facility]. The OB US report was generated in the AS system, and faxed to the ordering physician.   This report is also available in [employer] and in the [employer]. See AS Obstetric US report.   US Ob Follow Up  05/04/2011  OBSTETRICAL ULTRASOUND: This exam was performed within a [facility]. The OB US report was generated in the AS system, and faxed to the ordering physician.   This report is also available in [employer] and in the [employer]. See AS Obstetric US report.   US Ob Follow Up Add'l Gest  05/24/2011  OBSTETRICAL ULTRASOUND: This exam was performed within a [facility]. The OB US report was generated in the AS system, and faxed to the ordering physician.   This report is also available in [employer] and in the [employer]. See AS Obstetric US report.   US Ob Follow Up Add'l Gest  05/04/2011  OBSTETRICAL ULTRASOUND: This exam was performed within a  Ultrasound  Department. The OB US report was generated in the AS system, and faxed to the ordering  physician.   This report is also available in [employer] and in the [employer]. See AS Obstetric US report.    Assessment: [name] is  25 y.o. [phone] at [redacted]w[redacted]d presents with mild PreEclampsia. Will admit, and proceed with IOL at midnight when pt 37 wks.    Plan:  Pitocin,  Mg sulfate, probable epidural,   Carney Saxton V 10/24/20129:05 PM

## 2011-05-31 NOTE — ED Provider Notes (Signed)
[name] [id number] @[redacted]w[redacted]d  Chief Complaint  Patient presents with  . Emesis  . Contractions    SUBJECTIVE  HPI: Contracting since last night every 5-10 min and presents now due to onset of vomiting everything she attempts to eat or drink for last few hours. Good FM x2. No LOF or VB. Denies H/A, visual disturbance.  Past Medical History  Diagnosis Date  . Abnormal Pap smear   . Anemia   . Mental disorder     hx of pp depression  . STD (female)     chlamydia and trich  . Depression    Ob Hx: Gyn Hx: Past Surgical History  Procedure Date  . No past surgeries   . Cervical biopsy  w/ loop electrode excision    History   Social History  . Marital Status: Single    Spouse Name: N/A    Number of Children: N/A  . Years of Education: N/A   Occupational History  . Not on file.   Social History Main Topics  . Smoking status: Never Smoker   . Smokeless tobacco: Never Used  . Alcohol Use: No  . Drug Use: No  . Sexually Active: Yes    Birth Control/ Protection: None   Other Topics Concern  . Not on file   Social History Narrative  . No narrative on file   No current facility-administered medications on file prior to encounter.   Current Outpatient Prescriptions on File Prior to Encounter  Medication Sig Dispense Refill  . acetaminophen (TYLENOL) 500 MG tablet Take 500 mg by mouth every 6 (six) hours as needed. For headache      . ENSURE (ENSURE) Take 237 mLs by mouth daily.       . prenatal vitamin w/FE, FA (PRENATAL 1 + 1) 27-1 MG TABS Take 1 tablet by mouth daily.         No Known Allergies  ROS: Pertinent items in HPI  OBJECTIVE  BP 131/86  Pulse 89  Temp(Src) 98.2 F (36.8 C) (Oral)  Resp 16  Wt 80.74 kg (178 lb)  LMP 10/01/2010   Physical Exam:  General: WN/WD in NAD VWU:[id number], nt, size c/w term twin gestation VE: post, soft ftp/60/-2 vtx Back:neg CVAT Ext:2-3+ dep edema feet and LE  MAU course: Zofran with some relief. Retaining fluids.  UCs more painful. BP's borderline elevated with DBP now 93. Will get preE labs and recheck cx in 1 hr  Care turned over to Dr. Emelda Fear who is here at 1930  ASSESSMENT       PLAN   Care was turned over to Marylee Floras CNM at 2000.

## 2011-06-01 ENCOUNTER — Other Ambulatory Visit: Payer: Medicaid Other

## 2011-06-01 ENCOUNTER — Inpatient Hospital Stay (HOSPITAL_COMMUNITY): Payer: Medicaid Other | Admitting: Anesthesiology

## 2011-06-01 ENCOUNTER — Encounter (HOSPITAL_COMMUNITY): Payer: Self-pay | Admitting: Anesthesiology

## 2011-06-01 ENCOUNTER — Encounter (HOSPITAL_COMMUNITY): Payer: Self-pay | Admitting: *Deleted

## 2011-06-01 DIAGNOSIS — O30009 Twin pregnancy, unspecified number of placenta and unspecified number of amniotic sacs, unspecified trimester: Secondary | ICD-10-CM

## 2011-06-01 DIAGNOSIS — IMO0002 Reserved for concepts with insufficient information to code with codable children: Secondary | ICD-10-CM

## 2011-06-01 LAB — COMPREHENSIVE METABOLIC PANEL
ALT: 31 U/L (ref 0–35)
AST: 44 U/L — ABNORMAL HIGH (ref 0–37)
Alkaline Phosphatase: 340 U/L — ABNORMAL HIGH (ref 39–117)
CO2: 19 mEq/L (ref 19–32)
Chloride: 99 mEq/L (ref 96–112)
GFR calc Af Amer: 49 mL/min — ABNORMAL LOW (ref >90)
GFR calc non Af Amer: 42 mL/min — ABNORMAL LOW (ref >90)
Glucose, Bld: 100 mg/dL — ABNORMAL HIGH (ref 70–99)
Sodium: 133 mEq/L — ABNORMAL LOW (ref 135–145)
Total Bilirubin: 0.7 mg/dL (ref 0.3–1.2)

## 2011-06-01 LAB — CBC
HCT: 35.4 % — ABNORMAL LOW (ref 36.0–46.0)
MCH: 30.7 pg (ref 26.0–34.0)
MCV: 91.2 fL (ref 78.0–100.0)
RBC: 3.88 MIL/uL (ref 3.87–5.11)
WBC: 5.5 10*3/uL (ref 4.0–10.5)

## 2011-06-01 LAB — RPR: RPR Ser Ql: NONREACTIVE

## 2011-06-01 LAB — MAGNESIUM: Magnesium: 7.6 mg/dL (ref 1.5–2.5)

## 2011-06-01 LAB — MRSA PCR SCREENING: MRSA by PCR: NEGATIVE

## 2011-06-01 MED ORDER — LACTATED RINGERS IV SOLN
500.0000 mL | Freq: Once | INTRAVENOUS | Status: AC
  Administered 2011-06-02: 500 mL via INTRAVENOUS

## 2011-06-01 MED ORDER — FENTANYL 2.5 MCG/ML BUPIVACAINE 1/10 % EPIDURAL INFUSION (WH - ANES)
INTRAMUSCULAR | Status: DC | PRN
Start: 2011-06-01 — End: 2011-06-02
  Administered 2011-06-01: 14 mL/h via EPIDURAL

## 2011-06-01 MED ORDER — FENTANYL 2.5 MCG/ML BUPIVACAINE 1/10 % EPIDURAL INFUSION (WH - ANES)
14.0000 mL/h | INTRAMUSCULAR | Status: DC
  Administered 2011-06-01 – 2011-06-02 (×7): 14 mL/h via EPIDURAL
  Filled 2011-06-01 (×8): qty 60

## 2011-06-01 MED ORDER — PHENYLEPHRINE 40 MCG/ML (10ML) SYRINGE FOR IV PUSH (FOR BLOOD PRESSURE SUPPORT)
80.0000 ug | PREFILLED_SYRINGE | INTRAVENOUS | Status: DC | PRN
  Filled 2011-06-01: qty 5

## 2011-06-01 MED ORDER — MAGNESIUM SULFATE BOLUS VIA INFUSION
4.0000 g | Freq: Once | INTRAVENOUS | Status: AC
  Administered 2011-06-01: 4 g via INTRAVENOUS
  Filled 2011-06-01: qty 500

## 2011-06-01 MED ORDER — DIPHENHYDRAMINE HCL 50 MG/ML IJ SOLN: 12.5000 mg | INTRAMUSCULAR | Status: DC | PRN

## 2011-06-01 MED ORDER — PHENYLEPHRINE 40 MCG/ML (10ML) SYRINGE FOR IV PUSH (FOR BLOOD PRESSURE SUPPORT)
80.0000 ug | PREFILLED_SYRINGE | INTRAVENOUS | Status: DC | PRN
  Filled 2011-06-01 (×2): qty 5

## 2011-06-01 MED ORDER — EPHEDRINE 5 MG/ML INJ
10.0000 mg | INTRAVENOUS | Status: DC | PRN
  Administered 2011-06-01: 10 mg via INTRAVENOUS
  Filled 2011-06-01 (×2): qty 4

## 2011-06-01 MED ORDER — SODIUM BICARBONATE 8.4 % IV SOLN
INTRAVENOUS | Status: DC | PRN
  Administered 2011-06-01: 5 mL via EPIDURAL

## 2011-06-01 MED ORDER — MAGNESIUM SULFATE 40 G IN LACTATED RINGERS - SIMPLE
2.0000 g/h | INTRAVENOUS | Status: DC
  Administered 2011-06-01: 2 g/h via INTRAVENOUS
  Filled 2011-06-01: qty 500

## 2011-06-01 MED ORDER — MAGNESIUM SULFATE 40 G IN LACTATED RINGERS - SIMPLE
1.0000 g/h | INTRAVENOUS | Status: DC
  Filled 2011-06-01: qty 500

## 2011-06-01 MED ORDER — EPHEDRINE 5 MG/ML INJ
10.0000 mg | INTRAVENOUS | Status: DC | PRN
  Filled 2011-06-01 (×2): qty 4

## 2011-06-01 MED ORDER — LACTATED RINGERS IV BOLUS (SEPSIS)
500.0000 mL | Freq: Once | INTRAVENOUS | Status: AC
  Administered 2011-06-02: 500 mL via INTRAVENOUS

## 2011-06-01 NOTE — Anesthesia Preprocedure Evaluation (Signed)
Anesthesia Evaluation  General Assessment Comment  Airway       Dental   Pulmonary          Cardiovascular hypertension (Mild PIH, but has thrombocytopenia. Rechecked and stable at 102),     Neuro/Psych    GI/Hepatic   Endo/Other    Renal/GU      Musculoskeletal   Abdominal   Peds  Hematology   Anesthesia Other Findings   Reproductive/Obstetrics                           Anesthesia Physical Anesthesia Plan  ASA: II  Anesthesia Plan: Epidural   Post-op Pain Management:    Induction:   Airway Management Planned:   Additional Equipment:   Intra-op Plan:   Post-operative Plan:   Informed Consent: I have reviewed the patients History and Physical, chart, labs and discussed the procedure including the risks, benefits and alternatives for the proposed anesthesia with the patient or authorized representative who has indicated his/her understanding and acceptance.   Dental Advisory Given  Plan Discussed with:   Anesthesia Plan Comments: (Labs checked- platelets confirmed with RN in room. Fetal heart tracing, per RN, reported to be stable enough for sitting procedure. Discussed epidural, and patient consents to the procedure:  included risk of possible headache,backache, failed block, allergic reaction, and nerve injury. This patient was asked if she had any questions or concerns before the procedure started. )        Anesthesia Quick Evaluation

## 2011-06-01 NOTE — Progress Notes (Signed)
Subjective: Patient doing well.  Objective: BP 108/59  Pulse 89  Temp(Src) 97.6 F (36.4 C) (Oral)  Resp 16  Ht 6\' 1"  (1.854 m)  Wt 80.74 kg (178 lb)  BMI 23.48 kg/m2  LMP 10/01/2010 I/O last 3 completed shifts: In: 2152.5 [P.O.:924; I.V.:1228.5] Out: 640 [Urine:640] Total I/O In: 1621.6 [P.O.:620; I.V.:1001.6] Out: 795 [Urine:745; Emesis/NG output:50]  FHT:  FHR: Baby A 140s, baby B 120s bpm, variability: moderate,  accelerations:  Present,  decelerations:  Absent UC:   irregular, every 6-8 minutes SVE:   Dilation: 2 Effacement (%): 100 Station: -2 Exam by:: Dr Candelaria Celeste  Labs: Lab Results  Component Value Date   WBC 5.5 06/01/2011   HGB 11.9* 06/01/2011   HCT 35.4* 06/01/2011   MCV 91.2 06/01/2011   PLT 102* 06/01/2011    Assessment / Plan: Foley removed, broke up scar tissue in cervix manually and with kelly.  Labor: continue to titrate pitocin Fetal Wellbeing:  Category I Pain Control:  Epidural I/D:  n/a Anticipated MOD:  NSVD  [name] 06/01/2011, 3:47 PM

## 2011-06-01 NOTE — Progress Notes (Signed)
Subjective: IOL for preeclampsia, comfortable with epidural.  Objective: BP 120/92  Pulse 91  Temp(Src) 97.6 F (36.4 C) (Oral)  Resp 19  Ht 6\' 1"  (1.854 m)  Wt 80.74 kg (178 lb)  BMI 23.48 kg/m2  LMP 10/01/2010 I/O last 3 completed shifts: In: 2152.5 [P.O.:924; I.V.:1228.5] Out: 640 [Urine:640] Total I/O In: 1046.6 [P.O.:420; I.V.:626.6] Out: 595 [Urine:545; Emesis/NG output:50]  FHT:  FHR: Baby A - 130s, Baby B 140s bpm, variability: moderate,  accelerations:  Present,  decelerations:  Absent UC:   regular, every 4 minutes SVE:   Dilation: 2 Effacement (%): 80 Station: -2 Exam by:: Dr Adrian Blackwater  Labs: Lab Results  Component Value Date   WBC 5.5 06/01/2011   HGB 11.9* 06/01/2011   HCT 35.4* 06/01/2011   MCV 91.2 06/01/2011   PLT 102* 06/01/2011    Assessment / Plan: IOL for preeclampsia, foley bulb still in place.  Labor: continue pit. Preeclampsia:  on magnesium sulfate Fetal Wellbeing:  Category I Pain Control:  Epidural I/D:  n/a Anticipated MOD:  NSVD  [name] 06/01/2011, 12:18 PM

## 2011-06-01 NOTE — Progress Notes (Signed)
Patient examined, cervix stenotic at internal os, but I am able to pass tip of uterine packing forceps thru stenotic band, and stretch band to 1-2 cm, and pass foley bulb for cervical ripening.  Fhr good, x2, with pt comfortable with epidural; bp's 120's /70-80's with epidural. A: Twin gestation di/di vertex/vertex, symmetric growth, IOL for mild pre-E PGood prognosis for vag del.

## 2011-06-01 NOTE — Progress Notes (Signed)
Dr. Penne Lash at bedside and notified of pt status, SVE, FHRs, minimal variability noted, RN interventions, UC pattern, pitocin amount, magnesium sulfate D/C, urine output, and pt's BPs.  Will continue to monitor.

## 2011-06-01 NOTE — Progress Notes (Signed)
Dr. Penne Lash in department reviewing FHR strip.  Order received to perform vibroacoustic stimulation when pitocin re-started.

## 2011-06-01 NOTE — Progress Notes (Signed)
Dr. Adrian Blackwater at bedside and notified of pt status, FHRs, UC pattern, pt's BPs, pitocin amount, and traction applied to foley bulb.  Will continue to monitor.

## 2011-06-01 NOTE — Anesthesia Procedure Notes (Signed)

## 2011-06-01 NOTE — Progress Notes (Signed)
Dr. Emelda Fear at bedside placing foley bulb.

## 2011-06-01 NOTE — Progress Notes (Signed)
[name] is a 25 y.o. [id number] at [redacted]w[redacted]d Subjective: Pt feels better.  Magnesium level was 8.4  Magnesium has been off approx 2 hours.  Objective: BP 135/83  Pulse 92  Temp(Src) 97.9 F (36.6 C) (Oral)  Resp 18  Ht 6\' 1"  (1.854 m)  Wt 80.74 kg (178 lb)  BMI 23.48 kg/m2  SpO2 96%  LMP 10/01/2010 I/O last 3 completed shifts: In: 4416.1 [P.O.:1704; I.V.:2712.1] Out: 1582 [Urine:1532; Emesis/NG output:50]    FHT:  A 130, + accels, no decels, mod variability Baby B:  110, moderate variablility, +10-15 beat accels, no decels. SVE:   Dilation: 4 Effacement (%): 100 Station: -2 Exam by:: Dr.Gladis Cook  Labs: Lab Results  Component Value Date   WBC 5.5 06/01/2011   HGB 11.9* 06/01/2011   HCT 35.4* 06/01/2011   MCV 91.2 06/01/2011   PLT 102* 06/01/2011    Assessment / Plan: No making change, Ctx inadequate (MVU=50) Stop Pitocin x1 hour, ?pitocin receptors saturated. Will restart Magnesium sulfate.at 10 pm after magnesium level Reassuring FHT currently.   [name] H. 06/01/2011, 8:17 PM

## 2011-06-01 NOTE — Progress Notes (Signed)
Foley tugged on and came out.  Cx is still tight dimple, so foley, apparently, was not in cervix.  Attempted reinsertion, and not able to pass through stenotic cervix.  FHR 140's, avg LTV, + accels, no decels.  Pitocin on 2 mu/min. COntractions are mild and infrequent.  Pt will get epidural and then will have Dr Emelda Fear to assess whether stenotic cervix can be broken up.

## 2011-06-01 NOTE — Progress Notes (Signed)
[name] is a 25 y.o. [phone] at [redacted]w[redacted]d by ultrasound admitted for induction of labor due to multiple gestation.  Subjective: Patient in no pain, still no LOF or bleeding, willing to try Foley bulb for pneumatic cervical dilation   Past Medical History: It should be noted that patient had a LEEP performed in August 2011   Objective: BP 128/87  Pulse 90  Temp(Src) 97.9 F (36.6 C) (Oral)  Resp 18  Ht 6\' 1"  (1.854 m)  Wt 80.74 kg (178 lb)  BMI 23.48 kg/m2  LMP 10/01/2010      FHT: Baby A - baseline 140, marked variability, accels present, no decels   Baby B -  Baseline 140,  marked variability, accels present, no decels   UC:   regular, every 4 minutes SVE:  1/20/-2  Labs: Lab Results  Component Value Date   WBC 5.1 05/31/2011   HGB 12.5 05/31/2011   HCT 36.8 05/31/2011   MCV 91.1 05/31/2011   PLT 103* 05/31/2011    Assessment / Plan: 25 year old with di-di twins @ [redacted] weeks EGA for IOL and mild pre-eclampsia  1. IOL - Foley bulb placed, start pitocin 2. Pre-Eclampsia: Start Mg 3. Continuous Monitoring 4. Expected MOD - Vaginal    [name] 06/01/2011, 12:57 AM

## 2011-06-01 NOTE — Progress Notes (Signed)
Subjective: Called to patient bedside to review FHT.  Patient sleepy.  Not feeling contractions  Objective: BP 108/58  Pulse 83  Temp(Src) 98.1 F (36.7 C) (Oral)  Resp 18  Ht 6\' 1"  (1.854 m)  Wt 80.74 kg (178 lb)  BMI 23.48 kg/m2  LMP 10/01/2010 I/O last 3 completed shifts: In: 2152.5 [P.O.:924; I.V.:1228.5] Out: 640 [Urine:640] Total I/O In: 1941.6 [P.O.:690; I.V.:1251.6] Out: 905 [Urine:855; Emesis/NG output:50]  FHT:  FHR: Baby A 150s, baby B 110s bpm, variability: minimal ,  accelerations:  Abscent,  decelerations:  Absent UC:   regular, every 2-3 minutes SVE:   Dilation: 3.5 Effacement (%): 100 Station: -1 Exam by:: Dr Candelaria Celeste  Labs: Lab Results  Component Value Date   WBC 5.5 06/01/2011   HGB 11.9* 06/01/2011   HCT 35.4* 06/01/2011   MCV 91.2 06/01/2011   PLT 102* 06/01/2011    Assessment / Plan: Category 2 tracings.  IUPC placed to better monitor contractions.  ? Magnesium toxicity.  Will check state Magnesium level and turn off Magnesium drip while waiting for level to result.  Ermias Tomeo JEHIEL 06/01/2011, 6:10 PM

## 2011-06-01 NOTE — Progress Notes (Signed)
Manual cervix dilation with Kelly forceps by Dr Adrian Blackwater and Dr Jolayne Panther. Dr. Jolayne Panther and Dr. Adrian Blackwater notified of pt status, FHRs, UC pattern, and vital signs.  Will continue to monitor.

## 2011-06-01 NOTE — Progress Notes (Signed)
[name] is a 25 y.o. [phone] at [redacted]w[redacted]d admitted for induction of labor due to Pre-eclamptic toxemia of pregnancy..  Subjective: Pt c/o being very tired suddenly.   BP 87/44  Objective: BP 108/58  Pulse 83  Temp(Src) 98.1 F (36.7 C) (Oral)  Resp 18  Ht 6\' 1"  (1.854 m)  Wt 80.74 kg (178 lb)  BMI 23.48 kg/m2  LMP 10/01/2010 I/O last 3 completed shifts: In: 2152.5 [P.O.:924; I.V.:1228.5] Out: 640 [Urine:640] Total I/O In: 2066.6 [P.O.:690; I.V.:1376.6] Out: 928 [Urine:878; Emesis/NG output:50]  Lungs CTA B FHT:  A--Baseline 130, reactive with good variability, period of time where probably tracing both baby B, FSE placed and good scalp stim. Baby B--Baseline to 110, moderate variability, no decels, occas 10 beat accel. SVE:   Dilation: 3.5 Effacement (%): 100 Station: -1 Exam by:: Dr Candelaria Celeste  Labs: Lab Results  Component Value Date   WBC 5.5 06/01/2011   HGB 11.9* 06/01/2011   HCT 35.4* 06/01/2011   MCV 91.2 06/01/2011   PLT 102* 06/01/2011    Assessment / Plan: Induction of labor for pre eclampsia, magnesium sulfate stopped and mag level being obtained.  Bp very low, ephedrine given by RN.  Will watch closely.  Labor: inadequate labor.  MVU apporx 20 per ctx Preeclampsia:  on magnesium sulfate Fetal Wellbeing:  Category II Pain Control:  Epidural I/D:  n/a Anticipated MOD:  NSVD  Venessa Wickham H. 06/01/2011, 6:31 PM

## 2011-06-01 NOTE — Progress Notes (Signed)
Dr.  Penne Lash in department and notified of magnesium sulfate levels of 8.4.  New orders received to continue to hold mag at this time and will re-evaluate at 0100 or with patient change in level of awareness.

## 2011-06-01 NOTE — Progress Notes (Signed)
Phoned Dr Manus Rudd to inform of Baby B FHR change of minimal variablity, Baby A FHR, UC, and pt urine output. Request for a bedside evaluation

## 2011-06-01 NOTE — Progress Notes (Signed)
Dr. Clinton Sawyer in department and advised of decreased urine output, FHR interventions, patient lethargy, and patient emotions.  Call received at this time from lab with magnesium level of 7.6.  Mag will not be re-started based on orders.  Dr.  Clinton Sawyer in to visit with patient and perform head to toe assessment.  New orders received from Dr. Penne Lash to give patient a fluid bolus and have a CMP drawn.

## 2011-06-01 NOTE — Progress Notes (Signed)
[name] is a 25 y.o. [phone] at [redacted]w[redacted]d admitted for induction of labor due to pre-eclampsia in the setting of di-di twins.  Subjective: Patient has been on Magnesium which was being held temporarily 2/2 supertherapeutic level, Pitocin was not effective, so held and restarted at 9:00 PM  Patient currently nauseas, fatigued, does not enjoy the magnesium, denies current contractions, denies SOB; s/p epidural placed   Nursing concerned about reduced urinary output in last 6 hours (Foley placed)   Objective: BP 140/89  Pulse 85  Temp(Src) 97.8 F (36.6 C) (Oral)  Resp 18  Ht 6\' 1"  (1.854 m)  Wt 80.74 kg (178 lb)  BMI 23.48 kg/m2  SpO2 96%  LMP 10/01/2010 I/O last 3 completed shifts: In: 4416.1 [P.O.:1704; I.V.:2712.1] Out: 1582 [Urine:1532; Emesis/NG output:50] Total I/O In: 381.6 [P.O.:240; I.V.:141.6] Out: 28 [Urine:28]  Physical Exam: Gen: alert, oriented, somnelent Lungs: CTA-B Extremities: 2+   FHT: Baby A - Blue line 120, marked varibility, accels present, no decels; Baby B yellow line - 100, mod varibility, no accels, no decels - poor tracing  UC:   none SVE:   Dilation: 4 Effacement (%): 100 Station: -2 Exam by:: [name]  Labs: Lab Results  Component Value Date   WBC 5.5 06/01/2011   HGB 11.9* 06/01/2011   HCT 35.4* 06/01/2011   MCV 91.2 06/01/2011   PLT 102* 06/01/2011   Mag level: 7.6    Assessment / Plan: IOL for preeclampsia with poor progress  1. IOL - continue Pitocin for inadequate contractions, s/p ROM for Baby A now with FSE  2. Pre-Eclampsia - worsening peripheral edema, sluggish DTR's, lungs CTA-B, BP upper limits of normal  - Hold Magnesium until level below 7; get BMET  3. Pain - Epidural 4. Expected MOD - vaginal vs. c-section     [name] 06/01/2011, 10:40 PM

## 2011-06-02 ENCOUNTER — Encounter (HOSPITAL_COMMUNITY): Payer: Self-pay

## 2011-06-02 ENCOUNTER — Other Ambulatory Visit: Payer: Self-pay | Admitting: Obstetrics & Gynecology

## 2011-06-02 DIAGNOSIS — IMO0002 Reserved for concepts with insufficient information to code with codable children: Secondary | ICD-10-CM

## 2011-06-02 DIAGNOSIS — O30009 Twin pregnancy, unspecified number of placenta and unspecified number of amniotic sacs, unspecified trimester: Secondary | ICD-10-CM

## 2011-06-02 LAB — COMPREHENSIVE METABOLIC PANEL
ALT: 28 U/L (ref 0–35)
BUN: 7 mg/dL (ref 6–23)
CO2: 22 mEq/L (ref 19–32)
Calcium: 9.6 mg/dL (ref 8.4–10.5)
Creatinine, Ser: 1.94 mg/dL — ABNORMAL HIGH (ref 0.50–1.10)
GFR calc Af Amer: 40 mL/min — ABNORMAL LOW (ref >90)
GFR calc non Af Amer: 35 mL/min — ABNORMAL LOW (ref >90)
Glucose, Bld: 103 mg/dL — ABNORMAL HIGH (ref 70–99)

## 2011-06-02 LAB — PROTIME-INR
INR: 1.01 (ref 0.00–1.49)
Prothrombin Time: 13.5 seconds (ref 11.6–15.2)

## 2011-06-02 LAB — CBC
Hemoglobin: 12.3 g/dL (ref 12.0–15.0)
MCHC: 33.3 g/dL (ref 30.0–36.0)
WBC: 7.3 10*3/uL (ref 4.0–10.5)

## 2011-06-02 LAB — MAGNESIUM: Magnesium: 5.7 mg/dL — ABNORMAL HIGH (ref 1.5–2.5)

## 2011-06-02 LAB — APTT: aPTT: 32 seconds (ref 24–37)

## 2011-06-02 LAB — MRSA PCR SCREENING: MRSA by PCR: NEGATIVE

## 2011-06-02 MED ORDER — IBUPROFEN 600 MG PO TABS
600.0000 mg | ORAL_TABLET | Freq: Four times a day (QID) | ORAL | Status: DC
  Administered 2011-06-02 – 2011-06-03 (×4): 600 mg via ORAL
  Filled 2011-06-02 (×4): qty 1

## 2011-06-02 MED ORDER — PRENATAL PLUS 27-1 MG PO TABS
1.0000 | ORAL_TABLET | Freq: Every day | ORAL | Status: DC
  Administered 2011-06-02 – 2011-06-04 (×3): 1 via ORAL
  Filled 2011-06-02 (×3): qty 1

## 2011-06-02 MED ORDER — BENZOCAINE-MENTHOL 20-0.5 % EX AERO: 1.0000 "application " | INHALATION_SPRAY | CUTANEOUS | Status: DC | PRN

## 2011-06-02 MED ORDER — ONDANSETRON HCL 4 MG PO TABS
4.0000 mg | ORAL_TABLET | ORAL | Status: DC | PRN
  Administered 2011-06-03: 4 mg via ORAL
  Filled 2011-06-02: qty 1

## 2011-06-02 MED ORDER — TETANUS-DIPHTH-ACELL PERTUSSIS 5-2.5-18.5 LF-MCG/0.5 IM SUSP
0.5000 mL | Freq: Once | INTRAMUSCULAR | Status: DC
  Filled 2011-06-02: qty 0.5

## 2011-06-02 MED ORDER — LACTATED RINGERS IV SOLN: INTRAVENOUS | Status: DC

## 2011-06-02 MED ORDER — SIMETHICONE 80 MG PO CHEW: 80.0000 mg | CHEWABLE_TABLET | ORAL | Status: DC | PRN

## 2011-06-02 MED ORDER — OXYCODONE-ACETAMINOPHEN 5-325 MG PO TABS
1.0000 | ORAL_TABLET | ORAL | Status: DC | PRN
  Administered 2011-06-03: 2 via ORAL
  Filled 2011-06-02: qty 2

## 2011-06-02 MED ORDER — LANOLIN HYDROUS EX OINT: TOPICAL_OINTMENT | CUTANEOUS | Status: DC | PRN

## 2011-06-02 MED ORDER — DIBUCAINE 1 % RE OINT: 1.0000 "application " | TOPICAL_OINTMENT | RECTAL | Status: DC | PRN

## 2011-06-02 MED ORDER — WITCH HAZEL-GLYCERIN EX PADS: 1.0000 "application " | MEDICATED_PAD | CUTANEOUS | Status: DC | PRN

## 2011-06-02 MED ORDER — MISOPROSTOL 200 MCG PO TABS
ORAL_TABLET | ORAL | Status: AC
  Administered 2011-06-02: 1000 ug
  Filled 2011-06-02: qty 5

## 2011-06-02 MED ORDER — MEASLES, MUMPS & RUBELLA VAC ~~LOC~~ INJ
0.5000 mL | INJECTION | Freq: Once | SUBCUTANEOUS | Status: DC
  Filled 2011-06-02: qty 0.5

## 2011-06-02 MED ORDER — DIPHENHYDRAMINE HCL 25 MG PO CAPS: 25.0000 mg | ORAL_CAPSULE | Freq: Four times a day (QID) | ORAL | Status: DC | PRN

## 2011-06-02 MED ORDER — MAGNESIUM SULFATE 40 G IN LACTATED RINGERS - SIMPLE
1.0000 g/h | INTRAVENOUS | Status: DC
  Administered 2011-06-02: 1 g/h via INTRAVENOUS
  Filled 2011-06-02: qty 500

## 2011-06-02 MED ORDER — ONDANSETRON HCL 4 MG/2ML IJ SOLN: 4.0000 mg | INTRAMUSCULAR | Status: DC | PRN

## 2011-06-02 NOTE — Anesthesia Postprocedure Evaluation (Signed)
Anesthesia Post Note  Patient: [name]  Procedure(s) Performed: * No procedures listed *  Anesthesia type: Epidural  Patient location: Mother/Baby awaiting bed, 19 discharges pending  Post pain: Pain level controlled  Post assessment: Post-op Vital signs reviewed  Last Vitals:  Filed Vitals:   06/02/11 0831  BP: 148/88  Pulse: 92  Temp:   Resp: 18    Post vital signs: Reviewed  Level of consciousness: awake  Complications: No apparent anesthesia complications

## 2011-06-02 NOTE — Addendum Note (Signed)
Addendum  created 06/02/11 1708 by Karleen Dolphin   Modules edited:Charges VN, Notes Section

## 2011-06-02 NOTE — Progress Notes (Signed)
[name] is a 25 y.o. [phone] at [redacted]w[redacted]d by admitted for induction of labor due to pre-eclampsia in the setting of di-di twin gestation    Subjective: The patient is depressed and thinks that she is dying and her body is shutting down. At one point states that "I want them to take the babies, and let me go." She didn't expound upon that but then when asked if she feels like dying she says yes. She is inquiring about a cesarean section.   Objective: BP 136/80  Pulse 85  Temp(Src) 97.6 F (36.4 C) (Oral)  Resp 16  Ht 6\' 1"  (1.854 m)  Wt 80.74 kg (178 lb)  BMI 23.48 kg/m2  SpO2 96%  LMP 10/01/2010 I/O last 3 completed shifts: In: 4416.1 [P.O.:1704; I.V.:2712.1] Out: 1582 [Urine:1532; Emesis/NG output:50] Total I/O In: 405.8 [P.O.:240; I.V.:165.8] Out: 12 [Urine:78]  FHT: Baby 1 (blue line) baseline 125, marked variability, accels, no decels; Baby B (yellow line) baseline 100, mod variability, no accels, no decels in last 30 min   UC:   regular, every 4 minutes SVE:   Dilation: 4 cm, Effacement: 90, Station -1 @ 1:45 AM (unchanged from 8:00 PM 10/25)  Labs: Lab Results  Component Value Date   WBC 5.5 06/01/2011   HGB 11.9* 06/01/2011   HCT 35.4* 06/01/2011   MCV 91.2 06/01/2011   PLT 102* 06/01/2011    Lab 06/01/11 2130  NA 133*  K 4.4  CL 99  CO2 19  BUN 6  CREATININE 1.65*  CALCIUM 9.4  PROT 6.1  BILITOT 0.7  ALKPHOS 340*  ALT 31  AST 44*  GLUCOSE 100*       Assessment / Plan: Induction of labor is not progressing on pitocin s/p foley bulb and AROM for baby A  Preeclampsia: Mag being withheld given level of 7.0, however AKI and edema worsening, therefore continue fluid boluses  Fetal Wellbeing:  Category II Pain Control:  Epidural Anticipated MOD:  The patient is requesting a c-section, so I will call [name] regarding the procedure   [name] 06/02/2011, 1:54 AM

## 2011-06-02 NOTE — Progress Notes (Signed)
Delivery of live viable female by Dr Clinton Sawyer assisted by Dr Penne Lash. APGARS 7,9

## 2011-06-02 NOTE — Anesthesia Postprocedure Evaluation (Signed)
  Anesthesia Post-op Note  Patient: [name]  Procedure(s) Performed: * No procedures listed *  Patient Location: 374  Anesthesia Type: Epidural  Level of Consciousness: awake, alert  and oriented  Airway and Oxygen Therapy: Patient Spontanous Breathing  Post-op Pain: mild  Post-op Assessment: Post-op Vital signs reviewed, Patient's Cardiovascular Status Stable, No headache, No backache, No residual numbness and No residual motor weakness  Post-op Vital Signs: Reviewed and stable  Complications: No apparent anesthesia complications

## 2011-06-02 NOTE — Progress Notes (Signed)
Spoke to Dr Arby Barrette regarding platelet count, permission to pull epidural

## 2011-06-02 NOTE — Progress Notes (Signed)
UR chart review completed.  

## 2011-06-02 NOTE — Progress Notes (Signed)
06/02/11 1400 Pt rec'd via w/c from L&D into rm #[id number] s/p vaginal delivery of twins, w/PIH & thrombocytopenia, on IV magnesium. SBar report rec'd via telephone prior to pts arrival.

## 2011-06-02 NOTE — Progress Notes (Signed)
[name], CNM notified of mag level from midnight draw.  Received orders to continue with 500 mL bolus at 2 am and then notify midwife of output at 0230.

## 2011-06-02 NOTE — Progress Notes (Signed)
[name] is a 25 y.o. [phone] at [redacted]w[redacted]d wiwth preeclampsia  Subjective: Pt was asking about c/s vs vaginal delievery  Objective: BP 136/80  Pulse 85  Temp(Src) 97.6 F (36.4 C) (Oral)  Resp 16  Ht 6\' 1"  (1.854 m)  Wt 80.74 kg (178 lb)  BMI 23.48 kg/m2  SpO2 96%  LMP 10/01/2010 I/O last 3 completed shifts: In: 4416.1 [P.O.:1704; I.V.:2712.1] Out: 1582 [Urine:1532; Emesis/NG output:50] Total I/O In: 405.8 [P.O.:240; I.V.:165.8] Out: 78 [Urine:78]  6/100/0 A:  130 baseline, +accels, no decels, moderate variability B:  110 baseline, + accels, no decels, moderate variability Labs: Lab Results  Component Value Date   WBC 5.5 06/01/2011   HGB 11.9* 06/01/2011   HCT 35.4* 06/01/2011   MCV 91.2 06/01/2011   PLT 102* 06/01/2011    Assessment / Plan:  Long discussion with pt about c/s vs vaginal delivery.  After understanding risks of c.s and recovery vs vaginal delivery pt chooses to continue to labor.  We will recheck in 2 hours.  UOP is still low.  We will repeat the fluid bolus. CBC repeated   Reassuring fetal status.  Pt has mad 2 cm change and station has come down. Mg level still therapeutic at 7  [name] H. 06/02/2011, 2:10 AM

## 2011-06-02 NOTE — Progress Notes (Signed)
[name] is a 25 y.o. [phone] at [redacted]w[redacted]d with preeclampsia Subjective:   Objective: BP 117/60  Pulse 88  Temp(Src) 97.4 F (36.3 C) (Oral)  Resp 16  Ht 6\' 1"  (1.854 m)  Wt 80.74 kg (178 lb)  BMI 23.48 kg/m2  SpO2 96%  LMP 10/01/2010 I/O last 3 completed shifts: In: 4416.1 [P.O.:1704; I.V.:2712.1] Out: 1582 [Urine:1532; Emesis/NG output:50] Total I/O In: 985.3 [P.O.:596; I.V.:389.3] Out: 126 [Urine:126]  FHT:  A-130, mod variability, + accels, no decels  B-110, mod variability, + accels, no decels  UC:   regular, every 2-3 minutes (70 MVU at highest point) SVE:   Dilation: 9 Effacement (%): 100 Station: 0 Exam by:: Dr. Penne Lash  Labs: Lab Results  Component Value Date   WBC 7.3 06/02/2011   HGB 12.3 06/02/2011   HCT 36.9 06/02/2011   MCV 92.5 06/02/2011   PLT 107* 06/02/2011    Assessment / Plan: Augmentation of labor, progressing well  Labor: Making change (3 cm from 2 am-6 am) Preeclampsia:  on magnesium sulfate and low urine output.  Continue fluid boluses Fetal Wellbeing:  Category I Pain Control:  Epidural I/D:  n/a Anticipated MOD:  NSVD  Jerryl Holzhauer H. 06/02/2011, 6:26 AM

## 2011-06-02 NOTE — Progress Notes (Signed)
[name] is a 25 y.o. [phone] at [redacted]w[redacted]d admitted for IOL for pre-eclampsia with di-di twin gestation   Subjective: Patient frustrated with process, states that she is upset and wants to deliver the babies; denies SOB  Objective: BP 115/64  Pulse 95  Temp(Src) 97.6 F (36.4 C) (Oral)  Resp 18  Ht 6\' 1"  (1.854 m)  Wt 80.74 kg (178 lb)  BMI 23.48 kg/m2  SpO2 96%  LMP 10/01/2010 I/O last 3 completed shifts: In: 4416.1 [P.O.:1704; I.V.:2712.1] Out: 1582 [Urine:1532; Emesis/NG output:50] Total I/O In: 531 [P.O.:240; I.V.:291] Out: 91 [Urine:91]  Lungs: CTA-B Extremities: 1+ pitting edema on proximal tibial tip, 3+ pitting edema in feet bilaterally Urine - grossly blood and scant  FHT:   Baby A: baseline 140, moderate variability, accels present, no decels  Baby B: baseline 110, moderate variability, no accels, no decels   UC:  Inadequate on pitocin   SVE:   Dilation: 6 Effacement (%): 100 Station: 0 Exam by:: Kaleen Mask CNM  Labs: Lab Results  Component Value Date   WBC 5.5 06/01/2011   HGB 11.9* 06/01/2011   HCT 35.4* 06/01/2011   MCV 91.2 06/01/2011   PLT 102* 06/01/2011    Assessment / Plan: 1. IOL - Labor progressing poorly on pitocin, titrate up 2. Pre-eclapsia: rechecking Mg, CMET, AKI present with hematuria and oliguria  3. Fetal Wellbeing - Cat II 4. Expected MOD - c-section vs. SVD, to be determined   Clinton Sawyer, Ercia Crisafulli 06/02/2011, 4:23 AM

## 2011-06-03 LAB — COMPREHENSIVE METABOLIC PANEL
ALT: 15 U/L (ref 0–35)
AST: 23 U/L (ref 0–37)
Albumin: 1.7 g/dL — ABNORMAL LOW (ref 3.5–5.2)
Alkaline Phosphatase: 198 U/L — ABNORMAL HIGH (ref 39–117)
Chloride: 104 mEq/L (ref 96–112)
Potassium: 4.1 mEq/L (ref 3.5–5.1)
Sodium: 135 mEq/L (ref 135–145)
Total Bilirubin: 0.2 mg/dL — ABNORMAL LOW (ref 0.3–1.2)

## 2011-06-03 MED ORDER — OXYCODONE-ACETAMINOPHEN 5-325 MG PO TABS
2.0000 | ORAL_TABLET | ORAL | Status: DC | PRN
  Filled 2011-06-03: qty 1

## 2011-06-03 MED ORDER — DOCUSATE SODIUM 100 MG PO CAPS
100.0000 mg | ORAL_CAPSULE | Freq: Two times a day (BID) | ORAL | Status: DC | PRN
  Administered 2011-06-04: 100 mg via ORAL
  Filled 2011-06-03: qty 1

## 2011-06-03 MED ORDER — SODIUM CHLORIDE 0.9 % IJ SOLN: 3.0000 mL | INTRAMUSCULAR | Status: DC | PRN

## 2011-06-03 MED ORDER — SODIUM CHLORIDE 0.9 % IJ SOLN
3.0000 mL | Freq: Two times a day (BID) | INTRAMUSCULAR | Status: DC
  Administered 2011-06-03: 3 mL via INTRAVENOUS

## 2011-06-03 MED ORDER — OXYCODONE-ACETAMINOPHEN 5-325 MG PO TABS
1.0000 | ORAL_TABLET | ORAL | Status: DC | PRN
  Administered 2011-06-03 – 2011-06-04 (×3): 2 via ORAL
  Administered 2011-06-04: 1 via ORAL
  Filled 2011-06-03 (×2): qty 2
  Filled 2011-06-03: qty 1
  Filled 2011-06-03: qty 2

## 2011-06-03 NOTE — Progress Notes (Signed)
1027/12 IV Magnesium d/c'd.

## 2011-06-03 NOTE — Progress Notes (Signed)
Post Partum Day 1 Subjective: cramps  Objective: Blood pressure 138/77, pulse 83, temperature 97.9 F (36.6 C), temperature source Oral, resp. rate 16, height 6\' 1"  (1.854 m), weight 179 lb 8 oz (81.421 kg), last menstrual period 10/01/2010, SpO2 100.00%, unknown if currently breastfeeding.  Physical Exam: Filed Vitals:   06/03/11 0700 06/03/11 0816 06/03/11 0830 06/03/11 0900  BP: 137/84 140/81  138/77  Pulse: 76 73  83  Temp:  97.9 F (36.6 C)    TempSrc:  Oral    Resp: 18 16    Height:      Weight:  180 lb 3.2 oz (81.738 kg) 179 lb 8 oz (81.421 kg)   SpO2: 100% 100%  100%    General: alert and cooperative Lochia: appropriate Uterine Fundus: firm DVT Evaluation: No evidence of DVT seen on physical exam. Calf/Ankle edema is present.   Basename 06/02/11 0405 06/01/11 0330  HGB 12.3 11.9*  HCT 36.9 35.4*    Assessment/Plan: PPD 1, preeclampsia, improving Cr elevated D/C Mg CBC, CMP   LOS: 3 days   Moya Duan 06/03/2011, 9:24 AM

## 2011-06-03 NOTE — Progress Notes (Signed)
1027/12 1725 Telephone SBar report to Tresa Endo, RN for pt transfer to [facility]. 1745 Pt transferred via w/c to rm 131 - report updated.

## 2011-06-04 LAB — COMPREHENSIVE METABOLIC PANEL
Albumin: 1.8 g/dL — ABNORMAL LOW (ref 3.5–5.2)
Alkaline Phosphatase: 188 U/L — ABNORMAL HIGH (ref 39–117)
BUN: 11 mg/dL (ref 6–23)
Potassium: 3.9 mEq/L (ref 3.5–5.1)
Sodium: 135 mEq/L (ref 135–145)
Total Protein: 4.6 g/dL — ABNORMAL LOW (ref 6.0–8.3)

## 2011-06-04 LAB — CBC
HCT: 25.4 % — ABNORMAL LOW (ref 36.0–46.0)
HCT: 23.6 % — ABNORMAL LOW (ref 36.0–46.0)
MCHC: 33.5 g/dL (ref 30.0–36.0)
Platelets: 82 10*3/uL — ABNORMAL LOW (ref 150–400)
Platelets: 93 10*3/uL — ABNORMAL LOW (ref 150–400)
RDW: 12.3 % (ref 11.5–15.5)
RDW: 12.4 % (ref 11.5–15.5)
WBC: 8.2 10*3/uL (ref 4.0–10.5)

## 2011-06-04 NOTE — Progress Notes (Signed)
Post Partum Day 2 a/p SVD for twins complicated by pre-eclampsia   Subjective: Patient notes abdominal cramping and concern for not having a bowel movement; + flatus, voiding, tolerating PO; Regarding her mood says that she feels a little "down"   Objective: Temp:  [97.9 F (36.6 C)-99.1 F (37.3 C)] 98.4 F (36.9 C) (10/28 0619) Pulse Rate:  [61-91] 90  (10/28 0619) Resp:  [16-20] 18  (10/28 0619) BP: (121-142)/(70-86) 126/75 mmHg (10/28 0619) SpO2:  [98 %-100 %] 98 % (10/27 1800) Weight:  [80.559 kg (177 lb 9.6 oz)-81.738 kg (180 lb 3.2 oz)] 177 lb 9.6 oz (80.559 kg) (10/28 0619)   Lab 06/04/11 0539  NA 135  K 3.9  CL 101  CO2 28  BUN 11  CREATININE 1.21*  CALCIUM 8.6  PROT 4.6*  BILITOT 0.2*  ALKPHOS 188*  ALT 15  AST 24  GLUCOSE 68*      Physical Exam:  General: alert, cooperative, appears stated age and no distress Lochia: appropriate Uterine Fundus: firm DVT Evaluation: 3+ pitting edema in ankles, 2+ pre-tibial edema; no tenderness  Psych: smiling and interactive; denies suicidal or homicidal ideation; says her support group if her mother, husband (FOB), and mother of FOB   Basename 06/03/11 0949 06/02/11 0405  HGB 8.7* 12.3  HCT 25.4* 36.9    Assessment/Plan: Discharge home; Patient likely needs rapid follow-up for pre-eclampsia issues and postpartum depression screening   LOS: 4 days   [name] 06/04/2011, 6:33 AM

## 2011-06-04 NOTE — Discharge Summary (Signed)
Physician Discharge Summary  Patient ID: [name] MRN: [id number] DOB/AGE: [birth year] 25 y.o.  Admit date: 05/31/2011 Discharge date: 06/04/2011  Admission Diagnoses: Term Pregnancy of Twin Gestation; Pre-Eclampsia  Discharge Diagnoses:  1. Full term vaginal delivery of twin gestation 2. Postpartum hemorrhage 3. Pre-eclampsia 4. Acute Kidney Injury 5. Depression 6. Magnesium toxicity, resolved    Discharged Condition: good  Hospital Course: [name] is a [id number] female who presented at [redacted] weeks gestational age for induction of labor for pre-eclampsia and di-di twin gestation. For induction of labor a foley bulb was placed in the cervix for dilation. After the foley bulb was released, the patient was given pitocin. Her pre-eclampsia was treated with magnesium. Her magnesium reached a toxic level of 8.4 on 10/26, so it was temporarily discontinued until it decreased to less than 5. The patient also has acute kidney injury and her creatinine increased from 0.97 to 1.91 within 36 hours of admission. This was accompanied by oliguria and prevented clearance of the magnesium. [name] had a significant increase in swelling of the extremities during induction due to the multiple liters of fluid bolus that she received.   The patient spontaneously delivery di-di twins, both in vertex position. Thereafter, the patient experience post-partum hemorrhage. This was treated with fundal massage of Cytotec 1000 mcg per rectum. After delivery, the patient was transferred to the AICU for administration of magnesium. However, her magnesium level remained therapeutic without dosing anymore. It was 5.4 at the final draw on 10/26. The patient's hemoglobin is 7.9 and creatinine is 1.2 on the day of discharge. She is clinically improving. The only remaining concern for [name] is her mental health. She has a history of post-partum depression and made several comments during labor about dying. On the day of  discharge she is pleasant, but notes concerns about going home and getting depressed. She denies desire to harm herself or the baby.   Consults: none  Significant Diagnostic Studies: labs: Magnesium 8.4; Creatinine 1.9 on day of delivery and 1.2 on day of discharge   Treatments: See above  Discharge Medications: None  Discharge Exam: Blood pressure 126/75, pulse 90, temperature 98.4 F (36.9 C), temperature source Oral, resp. rate 18, height 6\' 1"  (1.854 m), weight 80.559 kg (177 lb 9.6 oz), last menstrual period 10/01/2010, SpO2 98.00%, unknown if currently breastfeeding. General appearance: alert, cooperative and appears older than stated age Extremities: edema 3+ pitting edema of ankles; 2+ pre-tibial edema  Disposition: Home or Self Care  Follow Up: The patient will follow-up at the high risk clinic at [facility] in 2-3 weeks for postpartum depression screening and a CMET to address kidney function.    SignedMat Carne 06/04/2011, 9:19 AM

## 2011-06-05 ENCOUNTER — Other Ambulatory Visit: Payer: Medicaid Other

## 2011-06-06 NOTE — ED Provider Notes (Signed)
Attestation of Attending Supervision of Advanced Practitioner: Evaluation and management procedures were performed by the PA/NP/CNM/OB Fellow under my supervision/collaboration. Chart reviewed and agree with management and plan.  Mackensie Pilson V 06/06/2011 7:00 PM

## 2011-06-08 NOTE — Discharge Summary (Signed)
Agree with summary. 

## 2011-06-08 NOTE — Progress Notes (Signed)
NST reactive from 05-29-11 for both babies.

## 2011-06-21 ENCOUNTER — Ambulatory Visit (INDEPENDENT_AMBULATORY_CARE_PROVIDER_SITE_OTHER): Payer: Medicaid Other | Admitting: Physician Assistant

## 2011-06-21 VITALS — BP 135/93 | HR 64 | Temp 97.1°F | Wt 155.2 lb

## 2011-06-21 DIAGNOSIS — Z3049 Encounter for surveillance of other contraceptives: Secondary | ICD-10-CM

## 2011-06-21 DIAGNOSIS — Z309 Encounter for contraceptive management, unspecified: Secondary | ICD-10-CM

## 2011-06-21 LAB — COMPLETE METABOLIC PANEL WITH GFR
Albumin: 4 g/dL (ref 3.5–5.2)
CO2: 25 mEq/L (ref 19–32)
GFR, Est African American: >89 mL/min/{1.73_m2}
GFR, Est Non African American: 80 mL/min/{1.73_m2}
Glucose, Bld: 76 mg/dL (ref 70–99)
Sodium: 141 mEq/L (ref 135–145)
Total Bilirubin: 0.6 mg/dL (ref 0.3–1.2)
Total Protein: 6.6 g/dL (ref 6.0–8.3)

## 2011-06-21 MED ORDER — MEDROXYPROGESTERONE ACETATE 150 MG/ML IM SUSP: 150.0000 mg | INTRAMUSCULAR | Status: DC

## 2011-06-21 MED ORDER — MEDROXYPROGESTERONE ACETATE 150 MG/ML IM SUSP
150.0000 mg | Freq: Once | INTRAMUSCULAR | Status: AC
  Administered 2011-06-21: 150 mg via INTRAMUSCULAR

## 2011-06-21 NOTE — Progress Notes (Signed)
  Subjective:    Patient ID: [name], female    DOB: 11-18-1985, 25 y.o.   MRN: [id number]  HPI 25yo female, [id number] s/p SVD of twins.  Admitted for IOL secondary to pre-eclampsia.  Developed AKI and Mag toxicity during labor.  Postpartum hemorrhage controlled with Cytotec, pitocin and uterine massage.  AICU postpartum without further complication.  D/C home with babies.  Doing well.  Babies are doing well, sleeping and growing.  Bottle feeding as pt was unable to breast feed secondary to decreased lactation.  No symptoms of depression and PPDS score was 0.  No return to sexual activity but has returned to normal activity without difficulty.  Sleeping well with good appetite.  Desires Depo-Provera for contraception.  Denies abdominal pain and vaginal bleeding has decreased to a light pink discharge.  No concerns.     Review of Systems  Constitutional: Negative for fever, chills and fatigue.  HENT: Negative for facial swelling.   Respiratory: Negative for chest tightness, shortness of breath and wheezing.   Cardiovascular: Negative for chest pain, palpitations and leg swelling.  Genitourinary: Positive for vaginal bleeding. Negative for dysuria, frequency, hematuria, decreased urine volume, vaginal discharge, difficulty urinating, vaginal pain and pelvic pain.       Vaginal bleeding decreased to light pink discharge  Musculoskeletal: Negative for back pain and gait problem.  Neurological: Negative for dizziness, seizures, syncope, weakness, light-headedness and headaches.  Psychiatric/Behavioral: Negative for behavioral problems, confusion, dysphoric mood and decreased concentration.       Objective:   Physical Exam  Constitutional: She is oriented to person, place, and time. She appears well-developed and well-nourished.  HENT:  Head: Normocephalic and atraumatic.  Nose: Nose normal.  Mouth/Throat: Oropharynx is clear and moist.  Eyes: Conjunctivae and EOM are normal. Pupils are equal,  round, and reactive to light.  Neck: Normal range of motion. Neck supple.  Cardiovascular: Normal rate, regular rhythm, normal heart sounds and intact distal pulses.   Pulmonary/Chest: Effort normal. No respiratory distress. She has no wheezes. She has no rales.  Abdominal: Soft. Bowel sounds are normal. She exhibits no distension and no mass. There is no tenderness.       Uterus is firm and palpable just above the symphysis pubis   Genitourinary: Uterus normal.  Musculoskeletal: Normal range of motion. She exhibits no edema.  Neurological: She is alert and oriented to person, place, and time. She has normal reflexes. She displays normal reflexes. She exhibits normal muscle tone. Coordination normal.  Skin: Skin is warm and dry.  Psychiatric: She has a normal mood and affect. Her behavior is normal. Judgment and thought content normal.   Filed Vitals:   06/21/11 1513  BP: 135/93  Pulse: 64  Temp: 97.1 F (36.2 C)       Assessment & Plan:  1. S/p SVD of twins with complications of AKI and pre-eclampsia   - CMP to assess kidney and liver function  - Depo-Provera IM injection 2. F/U 3 mos for Pap

## 2011-06-27 ENCOUNTER — Inpatient Hospital Stay (HOSPITAL_COMMUNITY): Admit: 2011-06-27 | Payer: Self-pay | Admitting: Obstetrics & Gynecology

## 2011-09-06 ENCOUNTER — Ambulatory Visit: Payer: Medicaid Other

## 2011-09-19 ENCOUNTER — Ambulatory Visit (INDEPENDENT_AMBULATORY_CARE_PROVIDER_SITE_OTHER): Payer: Medicaid Other | Admitting: *Deleted

## 2011-09-19 VITALS — BP 117/60 | HR 88 | Temp 98.0°F | Ht 73.0 in

## 2011-09-19 DIAGNOSIS — Z3049 Encounter for surveillance of other contraceptives: Secondary | ICD-10-CM

## 2011-09-19 MED ORDER — MEDROXYPROGESTERONE ACETATE 150 MG/ML IM SUSP
150.0000 mg | INTRAMUSCULAR | Status: AC
  Administered 2011-09-19 – 2012-03-13 (×2): 150 mg via INTRAMUSCULAR

## 2011-12-05 ENCOUNTER — Ambulatory Visit: Payer: Medicaid Other

## 2011-12-27 ENCOUNTER — Other Ambulatory Visit (INDEPENDENT_AMBULATORY_CARE_PROVIDER_SITE_OTHER): Payer: Medicaid Other | Admitting: Obstetrics and Gynecology

## 2011-12-27 VITALS — BP 126/78 | HR 88 | Wt 149.0 lb

## 2011-12-27 DIAGNOSIS — Z3049 Encounter for surveillance of other contraceptives: Secondary | ICD-10-CM

## 2011-12-27 MED ORDER — MEDROXYPROGESTERONE ACETATE 150 MG/ML IM SUSP
150.0000 mg | INTRAMUSCULAR | Status: AC
  Administered 2011-12-27: 150 mg via INTRAMUSCULAR

## 2011-12-27 NOTE — Progress Notes (Signed)
Patient states no intercourse in less than 2 weeks. Negative pregnancy test. Per protocol depo shot given.

## 2012-01-17 ENCOUNTER — Ambulatory Visit (INDEPENDENT_AMBULATORY_CARE_PROVIDER_SITE_OTHER): Payer: Medicaid Other | Admitting: Obstetrics and Gynecology

## 2012-01-17 VITALS — BP 131/75 | HR 80 | Temp 97.2°F | Wt 143.9 lb

## 2012-01-17 DIAGNOSIS — Z7251 High risk heterosexual behavior: Secondary | ICD-10-CM

## 2012-01-17 DIAGNOSIS — Z711 Person with feared health complaint in whom no diagnosis is made: Secondary | ICD-10-CM

## 2012-01-17 NOTE — Patient Instructions (Signed)
Safer Sex Your caregiver wants you to have this information about the infections that can be transmitted from sexual contact and how to prevent them. The idea behind safer sex is that you can be sexually active, and at the same time reduce the risk of giving or getting a sexually transmitted disease (STD). Every person should be aware of how to prevent him or herself and his or her sex partner from getting an STD. CAUSES OF STDS STDs are transmitted by sharing body fluids, which contain viruses and bacteria. The following fluids all transmit infections during sexual intercourse and sex acts:  Semen.   Saliva.   Urine.   Blood.   Vaginal mucus.  Examples of STDs include:  Chlamydia.   Gonorrhea.   Genital herpes.   Hepatitis B.   Human immunodeficiency virus or acquired immunodeficiency syndrome (HIV or AIDS).   Syphilis.   Trichomonas.   Pubic lice.   Human papillomavirus (HPV), which may include:   Genital warts.   Cervical dysplasia.   Cervical cancer (can develop with certain types of HPV).  SYMPTOMS  Sexual diseases often cause few or no symptoms until they are advanced, so a person can be infected and spread the infection without knowing it. Some STDs respond to treatment very well. Others, like HIV and herpes, cannot be cured, but are treated to reduce their effects. Specific symptoms include:  Abnormal vaginal discharge.   Irritation or itching in and around the vagina, and in the pubic hair.   Pain during sexual intercourse.   Bleeding during sexual intercourse.   Pelvic or abdominal pain.   Fever.   Growths in and around the vagina.   An ulcer in or around the vagina.   Swollen glands in the groin area.  DIAGNOSIS   Blood tests.   Pap test.   Culture test of abnormal vaginal discharge.   A test that applies a solution and examines the cervix with a lighted magnifying scope (colposcopy).   A test that examines the pelvis with a lighted  tube, through a small incision (laparoscopy).  TREATMENT  The treatment will depend on the cause of the STD.  Antibiotic treatment by injection, oral, creams, or suppositories in the vagina.   Over-the-counter medicated shampoo, to get rid of pubic lice.   Removing or treating growths with medicine, freezing, burning (electrocautery), or surgery.   Surgery treatment for HPV of the cervix.   Supportive medicines for herpes, HIV, AIDS, and hepatitis.  Being careful cannot eliminate all risk of infection, but sex can be made much safer. Safe sexual practices include body massage and gentle touching. Masturbation is safe, as long as body fluids do not contact skin that has sores or cuts. Dry kissing and oral sex on a man wearing a latex condom or on a woman wearing a female condom is also safe. Slightly less safe is intercourse while the man wears a latex condom or wet kissing. It is also safer to have one sex partner that you know is not having sex with anyone else. LENGTH OF ILLNESS An STD might be treated and cured in a week, sometimes a month, or more. And it can linger with symptoms for many years. STDs can also cause damage to the female organs. This can cause chronic pain, infertility, and recurrence of the STD, especially herpes, hepatitis, HIV, and HPV. HOME CARE INSTRUCTIONS AND PREVENTION  Alcohol and recreational drugs are often the reason given for not practicing safer sex. These substances affect   your judgment. Alcohol and recreational drugs can also impair your immune system, making you more vulnerable to disease.   Do not engage in risky and dangerous sexual practices, including:   Vaginal or anal sex without a condom.   Oral sex on a man without a condom.   Oral sex on a woman without a female condom.   Using saliva to lubricate a condom.   Any other sexual contact in which body fluids or blood from one partner contact the other partner.   You should use only latex  condoms for men and water soluble lubricants. Petroleum based lubricants or oils used to lubricate a condom will weaken the condom and increase the chance that it will break.   Think very carefully before having sex with anyone who is high risk for STDs and HIV. This includes IV drug users, people with multiple sexual partners, or people who have had an STD, or a positive hepatitis or HIV blood test.   Remember that even if your partner has had only one previous partner, their previous partner might have had multiple partners. If so, you are at high risk of being exposed to an STD. You and your sex partner should be the only sex partners with each other, with no one else involved.   A vaccine is available for hepatitis B and HPV through your caregiver or the Public Health Department. Everyone should be vaccinated with these vaccines.   Avoid risky sex practices. Sex acts that can break the skin make you more likely to get an STD.  SEEK MEDICAL CARE IF:   If you think you have an STD, even if you do not have any symptoms. Contact your caregiver for evaluation and treatment, if needed.   You think or know your sex partner has acquired an STD.   You have any of the symptoms mentioned above.  Document Released: 08/31/2004 Document Revised: 07/13/2011 Document Reviewed: 06/23/2009 Exit[employer] Patient Information 2012 [employer]. 

## 2012-01-17 NOTE — Progress Notes (Signed)
[name] [id number] @Unknown  by LMP Chief Complaint  Patient presents with  . STD Testing       SUBJECTIVE  HPI: 26 year old G4 P4005 states that she return from her honeymoon and then had sexual relations with father of her children. She is concerned that he has had other sexual contacts and wants all the STD testing. Denies vaginal discharge or itching. Denies pelvic pain. Hx CT. Had Depo last month.   Past Medical History  Diagnosis Date  . Abnormal Pap smear   . Anemia   . Mental disorder     hx of pp depression  . STD (female)     chlamydia and trich  . Depression   . Hx MRSA infection    Past Surgical History  Procedure Date  . No past surgeries   . Cervical biopsy  w/ loop electrode excision    History   Social History  . Marital Status: Single    Spouse Name: N/A    Number of Children: N/A  . Years of Education: N/A   Occupational History  . Not on file.   Social History Main Topics  . Smoking status: Never Smoker   . Smokeless tobacco: Never Used  . Alcohol Use: No  . Drug Use: No  . Sexually Active: Yes    Birth Control/ Protection: None   Other Topics Concern  . Not on file   Social History Narrative  . No narrative on file   Current Outpatient Prescriptions on File Prior to Visit  Medication Sig Dispense Refill  . acetaminophen (TYLENOL) 160 MG tablet Take 160 mg by mouth every 6 (six) hours as needed.        . medroxyPROGESTERone (DEPO-PROVERA) 150 MG/ML injection Inject 1 mL (150 mg total) into the muscle every 3 (three) months.  1 mL  4  . Prenatal Vit-Fe Fumarate-FA (MULTIVITAMIN-PRENATAL) 27-0.8 MG TABS Take 1 tablet by mouth daily.         Current Facility-Administered Medications on File Prior to Visit  Medication Dose Route Frequency Provider Last Rate Last Dose  . medroxyPROGESTERone (DEPO-PROVERA) injection 150 mg  150 mg Intramuscular Q90 days Brittany Antigua, MD   150 mg at 09/19/11 1103  . medroxyPROGESTERone (DEPO-PROVERA)  injection 150 mg  150 mg Intramuscular Q90 days [name], CNM   150 mg at 12/27/11 1121   No Known Allergies  ROS: Pertinent items in HPI  OBJECTIVE Blood pressure 131/75, pulse 80, temperature 97.2 F (36.2 C), temperature source Oral, weight 143 lb 14.4 oz (65.273 kg), not currently breastfeeding.  GENERAL: Well-developed, well-nourished female in no acute distress.  HEENT: Normocephalic, good dentition HEART: normal rate RESP: normal effort ABDOMEN: Soft, nontender EXTREMITIES: Nontender, no edema NEURO: Alert and oriented SPECULUM EXAM: NEFG, physiologic discharge, scant menstrual blood noted, cervix clean BIMANUAL: no cervical motion tenderness; uterus NSSP; no adnexal tenderness or masses    ASSESSMENT Visit for STD testing Normal pelvic exam  PLAN GC, CT, wet prep, hepatitis B and hepatitis C antibodies, RPR, HIV done Counseled on safe sex and use of condoms Return as scheduled for Depo-Provera     [name] 01/17/2012 3:36 PM

## 2012-01-18 LAB — WET PREP, GENITAL

## 2012-01-18 LAB — HEPATITIS B SURFACE ANTIBODY,QUALITATIVE: Hep B S Ab: POSITIVE — AB

## 2012-01-18 LAB — RPR

## 2012-02-07 ENCOUNTER — Emergency Department (HOSPITAL_COMMUNITY)
Admission: EM | Admit: 2012-02-07 | Discharge: 2012-02-07 | Disposition: A | Discharge status: Home / Self Care | Payer: Medicaid Other | Attending: Emergency Medicine | Admitting: Emergency Medicine

## 2012-02-07 ENCOUNTER — Encounter (HOSPITAL_COMMUNITY): Payer: Self-pay

## 2012-02-07 DIAGNOSIS — D649 Anemia, unspecified: Secondary | ICD-10-CM | POA: Insufficient documentation

## 2012-02-07 DIAGNOSIS — H00019 Hordeolum externum unspecified eye, unspecified eyelid: Secondary | ICD-10-CM | POA: Insufficient documentation

## 2012-02-07 DIAGNOSIS — Z8614 Personal history of Methicillin resistant Staphylococcus aureus infection: Secondary | ICD-10-CM | POA: Insufficient documentation

## 2012-02-07 DIAGNOSIS — H00011 Hordeolum externum right upper eyelid: Secondary | ICD-10-CM

## 2012-02-07 MED ORDER — BACITRACIN-POLYMYXIN B 500-10000 UNIT/GM EX OINT: TOPICAL_OINTMENT | Freq: Two times a day (BID) | CUTANEOUS | Status: AC

## 2012-02-07 MED ORDER — FLUORESCEIN SODIUM 1 MG OP STRP: 1.0000 | ORAL_STRIP | Freq: Once | OPHTHALMIC | Status: DC

## 2012-02-07 MED ORDER — TETRACAINE HCL 0.5 % OP SOLN: 2.0000 [drp] | Freq: Once | OPHTHALMIC | Status: DC

## 2012-02-07 NOTE — ED Notes (Signed)
MD at bedside. 

## 2012-02-07 NOTE — ED Provider Notes (Signed)
History   This chart was scribed for Forbes Cellar, MD by Charolett Bumpers . The patient was seen in room TR08C/TR08C.    CSN: [id number]  Arrival date & time 02/07/12  1150   None     Chief Complaint  Patient presents with  . Eye Pain    (Consider location/radiation/quality/duration/timing/severity/associated sxs/prior treatment) HPI [place] is a 26 y.o. female who presents to the Emergency Department complaining of constant, moderate right eyelid swelling for the past 3 days. Patient reports associated mild pain that is worse with blinking. Patient states that she has tried warm compresses at home with no relief. Patient denies any visual disturbances, fevers, chills, or ear pain. Patient denies any itching or watery drainage. Patient denies any h/o medical problems.    Past Medical History  Diagnosis Date  . Abnormal Pap smear   . Anemia   . Mental disorder     hx of pp depression  . STD (female)     chlamydia and trich  . Depression   . Hx MRSA infection     Past Surgical History  Procedure Date  . No past surgeries   . Cervical biopsy  w/ loop electrode excision     Family History  Problem Relation Age of Onset  . Cancer Paternal Grandmother   . Hypertension Maternal Grandmother   . Heart disease Maternal Grandmother   . Diabetes Maternal Grandmother   . Stroke Maternal Grandmother   . Cancer Maternal Grandfather   . Hypertension Mother     History  Substance Use Topics  . Smoking status: Never Smoker   . Smokeless tobacco: Never Used  . Alcohol Use: No    OB History    Grav Para Term Preterm Abortions TAB SAB Ect Mult Living   4 4 4  0 0 0 0 0 1 5      Review of Systems A complete 10 system review of systems was obtained and all systems are negative except as noted in the HPI and PMH.   Allergies  Review of patient's allergies indicates no known allergies.  Home Medications   Current Outpatient Rx  Name Route Sig Dispense Refill   . PRENATAL 27-0.8 MG PO TABS Oral Take 1 tablet by mouth daily.      Marland Kitchen BACITRACIN-POLYMYXIN B 500-10000 UNIT/GM EX OINT Topical Apply topically 2 (two) times daily. 15 g 0  . MEDROXYPROGESTERONE ACETATE 150 MG/ML IM SUSP Intramuscular Inject 150 mg into the muscle every 3 (three) months.      BP 124/77  Pulse 96  Temp 97.9 F (36.6 C) (Oral)  Resp 20  Ht 6' (1.829 m)  Wt 134 lb (60.782 kg)  BMI 18.17 kg/m2  SpO2 100%  Breastfeeding? No  Physical Exam  Nursing note and vitals reviewed. Constitutional: She is oriented to person, place, and time. She appears well-developed and well-nourished. No distress.  HENT:  Head: Normocephalic and atraumatic.  Eyes: EOM are normal. Pupils are equal, round, and reactive to light.         Swelling of right eyelid with tenderness to palpation. Erythema to underside of eyelid. No induration.   Neck: Neck supple. No tracheal deviation present.  Cardiovascular: Normal rate, regular rhythm and normal heart sounds.   Pulmonary/Chest: Effort normal and breath sounds normal. No respiratory distress. She has no wheezes.  Abdominal: Soft. Bowel sounds are normal. She exhibits no distension. There is no tenderness.  Musculoskeletal: Normal range of motion. She exhibits no edema.  Neurological: She is alert and oriented to person, place, and time. No sensory deficit.  Skin: Skin is warm and dry.  Psychiatric: She has a normal mood and affect. Her behavior is normal.    ED Course  Procedures (including critical care time)  DIAGNOSTIC STUDIES: Oxygen Saturation is 100% on room air, normal by my interpretation.    COORDINATION OF CARE:  12:52pm-Discussed planned course of treatment with the patient, who is agreeable at this time.    Labs Reviewed - No data to display No results found.   1. Hordeolum externum of right upper eyelid     MDM  Hordeolum with likely sterile inflammation. Less likely cellulitis. Will prescribe topical antibiotics,  follow with primary care Dr. She will continue to use warm compresses as discussed.  I personally performed the services described in this documentation, which was scribed in my presence. The recorded information has been reviewed and considered.       Forbes Cellar, MD 02/07/12 1301

## 2012-02-07 NOTE — ED Notes (Signed)
Pt presents with 3 day h/o R eyelid swelling and pain, pt denies any drainage to area, denies any vision change.

## 2012-02-07 NOTE — ED Notes (Addendum)
Pt states that for about 3 days she has been having right eye pain. Pt states she has not put anything on it or taken anything for pain. Pt states pain level is at a 4 when she blinks her eye. Pt states she that this happens to her eye often. Pt states she has not seen a specialist in the past for her eye. Pt eye is reactive to light, no visible discolor to sclera. Swelling is visible on the top eyelid of rt eye.

## 2012-03-13 ENCOUNTER — Ambulatory Visit (INDEPENDENT_AMBULATORY_CARE_PROVIDER_SITE_OTHER): Payer: Medicaid Other | Admitting: *Deleted

## 2012-03-13 VITALS — BP 105/65 | HR 71 | Temp 98.3°F

## 2012-03-13 DIAGNOSIS — Z3049 Encounter for surveillance of other contraceptives: Secondary | ICD-10-CM

## 2012-03-13 NOTE — Progress Notes (Signed)
Patient her for depoprovera, last pap 10/2009, postpartum visit 06/2011- discussed with M. Mayford Knife, CNM and instructed patient may get shot today, but needs to get pap/annual either before next depo or on same day. Patient also discussed may want BTL or 10 year IUD- instructed patient can make separate appointment before then for surgical consult if wants btl/ or birth control consult/surgery consult if desires or discuss at annual exam/pap

## 2012-05-29 ENCOUNTER — Ambulatory Visit: Payer: Medicaid Other

## 2012-05-31 ENCOUNTER — Ambulatory Visit: Payer: Medicaid Other | Admitting: Obstetrics and Gynecology

## 2012-06-26 ENCOUNTER — Ambulatory Visit (INDEPENDENT_AMBULATORY_CARE_PROVIDER_SITE_OTHER): Payer: Medicaid Other | Admitting: Advanced Practice Midwife

## 2012-06-26 ENCOUNTER — Other Ambulatory Visit (HOSPITAL_COMMUNITY)
Admission: RE | Admit: 2012-06-26 | Discharge: 2012-06-26 | Disposition: A | Discharge status: Home / Self Care | Payer: Medicaid Other | Source: Ambulatory Visit | Attending: Advanced Practice Midwife | Admitting: Advanced Practice Midwife

## 2012-06-26 ENCOUNTER — Encounter: Payer: Self-pay | Admitting: Advanced Practice Midwife

## 2012-06-26 VITALS — BP 119/71 | HR 101 | Temp 97.4°F | Resp 12 | Ht 71.0 in | Wt 139.1 lb

## 2012-06-26 DIAGNOSIS — N760 Acute vaginitis: Secondary | ICD-10-CM | POA: Insufficient documentation

## 2012-06-26 DIAGNOSIS — Z01419 Encounter for gynecological examination (general) (routine) without abnormal findings: Secondary | ICD-10-CM

## 2012-06-26 DIAGNOSIS — R768 Other specified abnormal immunological findings in serum: Secondary | ICD-10-CM | POA: Insufficient documentation

## 2012-06-26 DIAGNOSIS — R894 Abnormal immunological findings in specimens from other organs, systems and tissues: Secondary | ICD-10-CM

## 2012-06-26 DIAGNOSIS — Z113 Encounter for screening for infections with a predominantly sexual mode of transmission: Secondary | ICD-10-CM | POA: Insufficient documentation

## 2012-06-26 LAB — COMPREHENSIVE METABOLIC PANEL
Alkaline Phosphatase: 51 U/L (ref 39–117)
BUN: 9 mg/dL (ref 6–23)
Glucose, Bld: 76 mg/dL (ref 70–99)
Total Bilirubin: 1.2 mg/dL (ref 0.3–1.2)

## 2012-06-26 LAB — RPR

## 2012-06-26 NOTE — Progress Notes (Signed)
  Subjective:     [name] is a 26 y.o. female here for a routine exam.  Current complaints: intermittent pubic bone pain.  Personal health questionnaire reviewed: not asked.   Gynecologic History No LMP recorded. Patient has had an injection. Contraception: Depo-Provera injections Last Pap: 3/11. Results were: normal Last mammogram: never  Obstetric History OB History    Grav Para Term Preterm Abortions TAB SAB Ect Mult Living   4 4 4  0 0 0 0 0 1 5     # Outc Date GA Lbr Len/2nd Wgt Sex Del Anes PTL Lv   1 TRM 11/01 [redacted]w[redacted]d  6lb1oz(2.75kg) F SVD None  Yes   2 TRM 6/05 [redacted]w[redacted]d  5lb8oz(2.495kg) F SVD None  Yes   3 TRM 3/08 [redacted]w[redacted]d  6lb1oz(2.75kg) F SVD None  Yes   4A TRM 10/12 [redacted]w[redacted]d 41:00 / 00:04 5lb11.9oz(2.605kg) M SVD EPI  Yes   4B  10/12 [redacted]w[redacted]d 41:00 / 00:29 5lb9.4oz(2.534kg) F SVD EPI  Yes       The following portions of the patient's history were reviewed and updated as appropriate: allergies, current medications, past family history, past medical history, past social history, past surgical history and problem list.  Review of Systems Pertinent items are noted in HPI.    Objective:    BP 119/71  Pulse 101  Temp 97.4 F (36.3 C) (Oral)  Resp 12  Ht 5\' 11"  (1.803 m)  Wt 139 lb 1.6 oz (63.095 kg)  BMI 19.40 kg/m2  Breastfeeding? No General appearance: alert, cooperative and no distress Lungs: clear to auscultation bilaterally Breasts: normal appearance, no masses or tenderness Heart: regular rate and rhythm, S1, S2 normal, no murmur, click, rub or gallop Abdomen: soft, non-tender; bowel sounds normal; no masses,  no organomegaly and no enlargement of liver appreciated Pelvic: cervix normal in appearance, external genitalia normal, no adnexal masses or tenderness, no cervical motion tenderness, rectovaginal septum normal, uterus normal size, shape, and consistency and vagina normal without discharge    Assessment:    Healthy female exam.    Plan:    Education  reviewed: safe sex/STD prevention and Hepatitis and need for followup and treatment. Contraception: Depo-Provera injections. Follow up in: 1 year.  Will Draw Hep B Core Antibody, CMET (LFTs), Hep C, HIV and RPR Will refer to [facility] for Hepatitis management

## 2012-06-26 NOTE — Patient Instructions (Addendum)
Hepatitis B Hepatitis B is a liver infection. It is caused by a germ called hepatitis B virus (HBV). This germ enters the body through blood and other bodily fluids. HOME CARE  Rest when you feel tired. Eat when you are hungry.  Avoid a sexual relationship until your doctor says it is okay.  Avoid activities that could put other people in contact with your blood. Examples include sharing a toothbrush, nail clippers, razors, and needles.  Follow your doctor's advice to avoid spreading the infection to others.  Only take medicines as told by your doctor. GET HELP RIGHT AWAY IF:  You are not able to eat or drink.  You feel sick to your stomach (nauseous) or throw up (vomit).  You feel confused.  Your skin or the whites of your eyes turn more yellow (jaundice).  You have trouble breathing.  You get a rash.  Your skin, throat, mouth, or face becomes puffy (swollen).  You start twitching or shaking (seizure).  You are very sleepy and have trouble waking up. MAKE SURE YOU:   Understand these instructions.  Will watch your condition.  Will get help right away if you are not doing well or get worse. Document Released: 12/10/2008 Document Revised: 10/16/2011 Document Reviewed: 11/23/2010 [facility] Patient Information 2013 [place], [place]. Health Maintenance, Females A healthy lifestyle and preventative care can promote health and wellness.  Maintain regular health, dental, and eye exams.  Eat a healthy diet. Foods like vegetables, fruits, whole grains, low-fat dairy products, and lean protein foods contain the nutrients you need without too many calories. Decrease your intake of foods high in solid fats, added sugars, and salt. Get information about a proper diet from your caregiver, if necessary.  Regular physical exercise is one of the most important things you can do for your health. Most adults should get at least 150 minutes of moderate-intensity exercise (any activity that  increases your heart rate and causes you to sweat) each week. In addition, most adults need muscle-strengthening exercises on 2 or more days a week.   Maintain a healthy weight. The body mass index (BMI) is a screening tool to identify possible weight problems. It provides an estimate of body fat based on height and weight. Your caregiver can help determine your BMI, and can help you achieve or maintain a healthy weight. For adults 20 years and older:  A BMI below 18.5 is considered underweight.  A BMI of 18.5 to 24.9 is normal.  A BMI of 25 to 29.9 is considered overweight.  A BMI of 30 and above is considered obese.  Maintain normal blood lipids and cholesterol by exercising and minimizing your intake of saturated fat. Eat a balanced diet with plenty of fruits and vegetables. Blood tests for lipids and cholesterol should begin at age 69 and be repeated every 5 years. If your lipid or cholesterol levels are high, you are over 50, or you are a high risk for heart disease, you may need your cholesterol levels checked more frequently.Ongoing high lipid and cholesterol levels should be treated with medicines if diet and exercise are not effective.  If you smoke, find out from your caregiver how to quit. If you do not use tobacco, do not start.  If you are pregnant, do not drink alcohol. If you are breastfeeding, be very cautious about drinking alcohol. If you are not pregnant and choose to drink alcohol, do not exceed 1 drink per day. One drink is considered to be 12 ounces (355  mL) of beer, 5 ounces (148 mL) of wine, or 1.5 ounces (44 mL) of liquor.  Avoid use of street drugs. Do not share needles with anyone. Ask for help if you need support or instructions about stopping the use of drugs.  High blood pressure causes heart disease and increases the risk of stroke. Blood pressure should be checked at least every 1 to 2 years. Ongoing high blood pressure should be treated with medicines, if weight  loss and exercise are not effective.  If you are 65 to 26 years old, ask your caregiver if you should take aspirin to prevent strokes.  Diabetes screening involves taking a blood sample to check your fasting blood sugar level. This should be done once every 3 years, after age 64, if you are within normal weight and without risk factors for diabetes. Testing should be considered at a younger age or be carried out more frequently if you are overweight and have at least 1 risk factor for diabetes.  Breast cancer screening is essential preventative care for women. You should practice "breast self-awareness." This means understanding the normal appearance and feel of your breasts and may include breast self-examination. Any changes detected, no matter how small, should be reported to a caregiver. Women in their 73s and 30s should have a clinical breast exam (CBE) by a caregiver as part of a regular health exam every 1 to 3 years. After age 81, women should have a CBE every year. Starting at age 66, women should consider having a mammogram (breast X-ray) every year. Women who have a family history of breast cancer should talk to their caregiver about genetic screening. Women at a high risk of breast cancer should talk to their caregiver about having an MRI and a mammogram every year.  The Pap test is a screening test for cervical cancer. Women should have a Pap test starting at age 74. Between ages 17 and 63, Pap tests should be repeated every 2 years. Beginning at age 21, you should have a Pap test every 3 years as long as the past 3 Pap tests have been normal. If you had a hysterectomy for a problem that was not cancer or a condition that could lead to cancer, then you no longer need Pap tests. If you are between ages 66 and 78, and you have had normal Pap tests going back 10 years, you no longer need Pap tests. If you have had past treatment for cervical cancer or a condition that could lead to cancer, you need  Pap tests and screening for cancer for at least 20 years after your treatment. If Pap tests have been discontinued, risk factors (such as a new sexual partner) need to be reassessed to determine if screening should be resumed. Some women have medical problems that increase the chance of getting cervical cancer. In these cases, your caregiver may recommend more frequent screening and Pap tests.  The human papillomavirus (HPV) test is an additional test that may be used for cervical cancer screening. The HPV test looks for the virus that can cause the cell changes on the cervix. The cells collected during the Pap test can be tested for HPV. The HPV test could be used to screen women aged 56 years and older, and should be used in women of any age who have unclear Pap test results. After the age of 48, women should have HPV testing at the same frequency as a Pap test.  Colorectal cancer can be detected  and often prevented. Most routine colorectal cancer screening begins at the age of 55 and continues through age 71. However, your caregiver may recommend screening at an earlier age if you have risk factors for colon cancer. On a yearly basis, your caregiver may provide home test kits to check for hidden blood in the stool. Use of a small camera at the end of a tube, to directly examine the colon (sigmoidoscopy or colonoscopy), can detect the earliest forms of colorectal cancer. Talk to your caregiver about this at age 45, when routine screening begins. Direct examination of the colon should be repeated every 5 to 10 years through age 67, unless early forms of pre-cancerous polyps or small growths are found.  Hepatitis C blood testing is recommended for all people born from [birth year] through 1965 and any individual with known risks for hepatitis C.  Practice safe sex. Use condoms and avoid high-risk sexual practices to reduce the spread of sexually transmitted infections (STIs). Sexually active women aged 40 and  younger should be checked for Chlamydia, which is a common sexually transmitted infection. Older women with new or multiple partners should also be tested for Chlamydia. Testing for other STIs is recommended if you are sexually active and at increased risk.  Osteoporosis is a disease in which the bones lose minerals and strength with aging. This can result in serious bone fractures. The risk of osteoporosis can be identified using a bone density scan. Women ages 35 and over and women at risk for fractures or osteoporosis should discuss screening with their caregivers. Ask your caregiver whether you should be taking a calcium supplement or vitamin D to reduce the rate of osteoporosis.  Menopause can be associated with physical symptoms and risks. Hormone replacement therapy is available to decrease symptoms and risks. You should talk to your caregiver about whether hormone replacement therapy is right for you.  Use sunscreen with a sun protection factor (SPF) of 30 or greater. Apply sunscreen liberally and repeatedly throughout the day. You should seek shade when your shadow is shorter than you. Protect yourself by wearing long sleeves, pants, a wide-brimmed hat, and sunglasses year round, whenever you are outdoors.  Notify your caregiver of new moles or changes in moles, especially if there is a change in shape or color. Also notify your caregiver if a mole is larger than the size of a pencil eraser.  Stay current with your immunizations. Document Released: 02/06/2011 Document Revised: 10/16/2011 Document Reviewed: 02/06/2011 [facility] Patient Information 2013 [place], [place].

## 2012-06-28 ENCOUNTER — Other Ambulatory Visit: Payer: Self-pay | Admitting: Advanced Practice Midwife

## 2012-06-28 ENCOUNTER — Encounter: Payer: Self-pay | Admitting: Advanced Practice Midwife

## 2012-06-28 MED ORDER — METRONIDAZOLE 500 MG PO TABS: 2000.0000 mg | ORAL_TABLET | Freq: Once | ORAL | Status: DC

## 2012-07-01 ENCOUNTER — Telehealth: Payer: Self-pay | Admitting: *Deleted

## 2012-07-01 DIAGNOSIS — A599 Trichomoniasis, unspecified: Secondary | ICD-10-CM

## 2012-07-01 MED ORDER — METRONIDAZOLE 500 MG PO TABS: 2000.0000 mg | ORAL_TABLET | Freq: Once | ORAL | Status: DC

## 2012-07-01 NOTE — Telephone Encounter (Signed)
Pt informed of results. Advised that her partner needs to be treated as well. Rx sent to [facility].

## 2012-07-01 NOTE — Telephone Encounter (Signed)
Message copied by Mannie Stabile on Mon Jul 01, 2012 11:53 AM ------      Message from: Ernest, [place]      Created: Fri Jun 28, 2012 12:30 AM      Regarding: Need to inform pt of + trich       Trich seen on pap, pap was normal            Need to treat with Metronidazole 2gm            Thanks

## 2012-08-20 ENCOUNTER — Ambulatory Visit: Payer: Medicaid Other | Admitting: Family Medicine

## 2012-09-11 ENCOUNTER — Ambulatory Visit: Payer: Medicaid Other

## 2012-10-08 ENCOUNTER — Ambulatory Visit (INDEPENDENT_AMBULATORY_CARE_PROVIDER_SITE_OTHER): Payer: Self-pay

## 2012-10-08 VITALS — BP 124/82 | HR 65 | Temp 96.2°F | Ht 68.75 in | Wt 141.5 lb

## 2012-10-08 DIAGNOSIS — Z3049 Encounter for surveillance of other contraceptives: Secondary | ICD-10-CM

## 2012-10-08 DIAGNOSIS — Z3042 Encounter for surveillance of injectable contraceptive: Secondary | ICD-10-CM

## 2012-10-08 LAB — POCT PREGNANCY, URINE: Preg Test, Ur: NEGATIVE

## 2012-10-08 MED ORDER — MEDROXYPROGESTERONE ACETATE 150 MG/ML IM SUSP
150.0000 mg | Freq: Once | INTRAMUSCULAR | Status: AC
  Administered 2012-10-08: 150 mg via INTRAMUSCULAR

## 2012-12-24 ENCOUNTER — Ambulatory Visit (INDEPENDENT_AMBULATORY_CARE_PROVIDER_SITE_OTHER): Payer: Self-pay

## 2012-12-24 VITALS — BP 120/80 | HR 70 | Temp 97.4°F | Ht 69.0 in | Wt 143.2 lb

## 2012-12-24 DIAGNOSIS — Z3049 Encounter for surveillance of other contraceptives: Secondary | ICD-10-CM

## 2012-12-24 DIAGNOSIS — Z3042 Encounter for surveillance of injectable contraceptive: Secondary | ICD-10-CM

## 2012-12-24 MED ORDER — MEDROXYPROGESTERONE ACETATE 150 MG/ML IM SUSP
150.0000 mg | Freq: Once | INTRAMUSCULAR | Status: AC
  Administered 2012-12-24: 150 mg via INTRAMUSCULAR

## 2013-03-14 ENCOUNTER — Ambulatory Visit: Payer: Self-pay

## 2013-03-19 ENCOUNTER — Ambulatory Visit (INDEPENDENT_AMBULATORY_CARE_PROVIDER_SITE_OTHER): Payer: Medicaid Other | Admitting: *Deleted

## 2013-03-19 VITALS — Resp 20

## 2013-03-19 DIAGNOSIS — Z3049 Encounter for surveillance of other contraceptives: Secondary | ICD-10-CM

## 2013-03-19 MED ORDER — MEDROXYPROGESTERONE ACETATE 150 MG/ML IM SUSP
150.0000 mg | Freq: Once | INTRAMUSCULAR | Status: AC
  Administered 2013-03-19: 150 mg via INTRAMUSCULAR

## 2013-03-19 NOTE — Progress Notes (Signed)
Pt here for Depo Provera injection. While assessing her vital signs, pt stated that she thinks she has been depressed for about 3 months. She has 5 children ages 71, 68, 16 and twins that will soon be 27 year old.  She is considering placing the twins for adoption because of all the stress she is having.  I gave pt information for [facility] hotline and she agrees to call. She is open to counseling and possible medication treatment if needed for depression.  I also gave her a list of [facility] resources for her financial concerns, etc.

## 2013-04-21 IMAGING — US US OB FOLLOW-UP
2 series · 15 of 28 positions shown · non-contrast
Comparison: none

[Series 1: us ob follow up · 1 of 1 slices shown (1 of 2)]
[im 1/1]
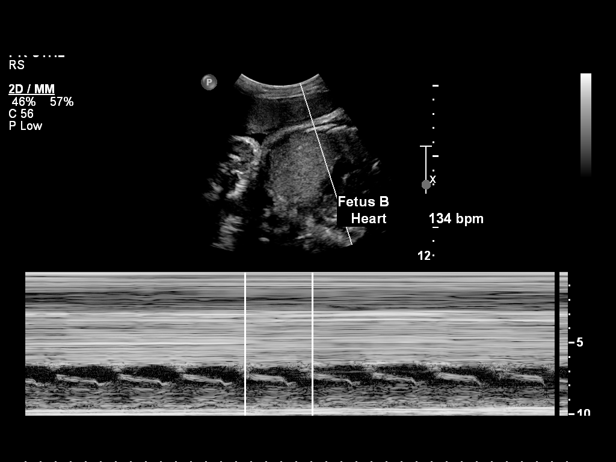

[Series 1: us ob follow up · 14 of 41 slices shown (2 of 2)]
[im 2/41]
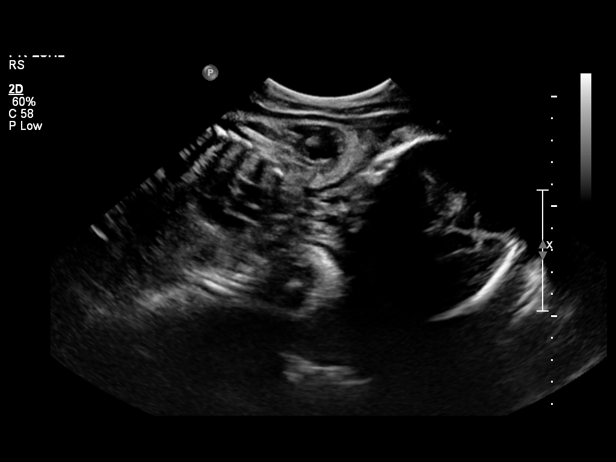
[im 5/41]
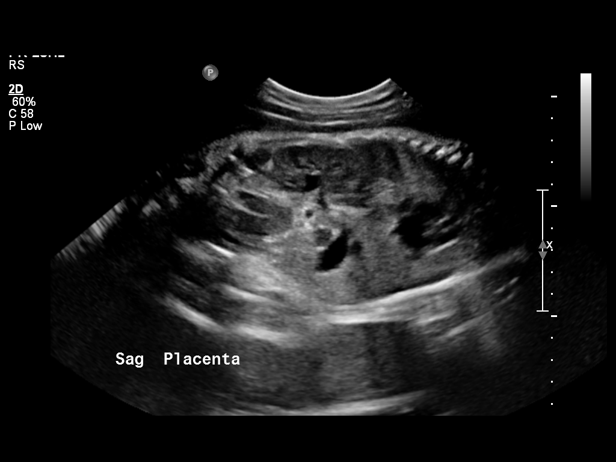
[im 8/41]
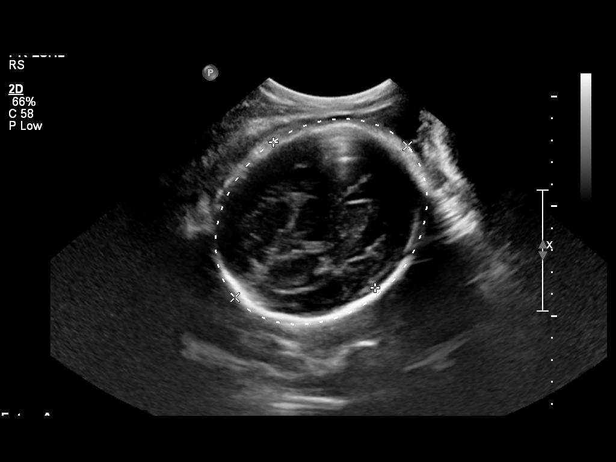
[im 11/41]
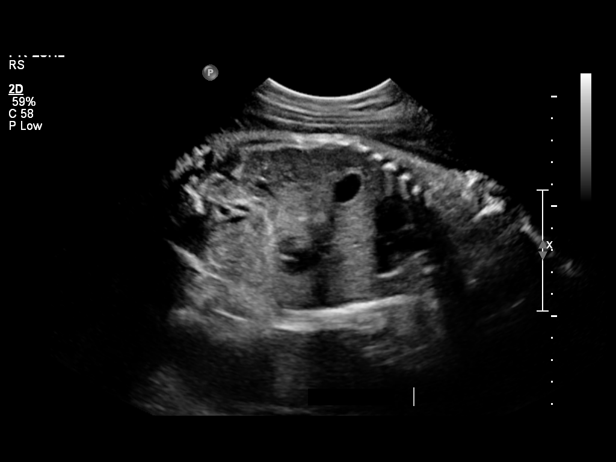
[im 14/41]
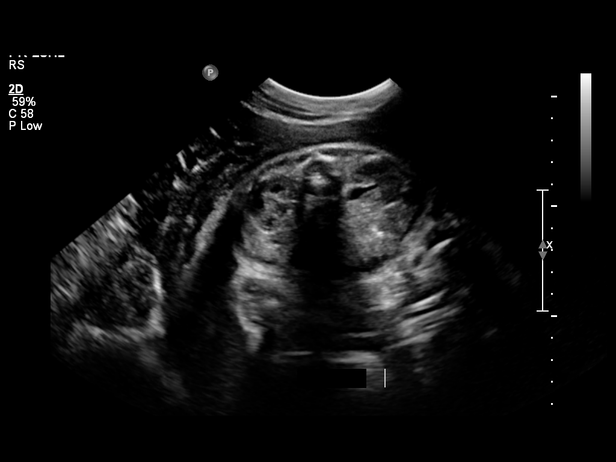
[im 17/41]
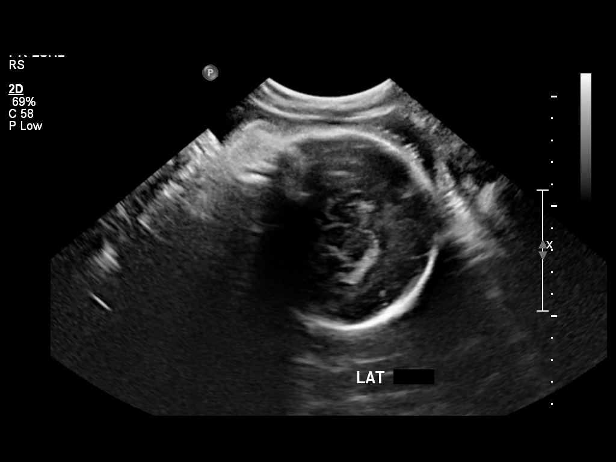
[im 21/41]
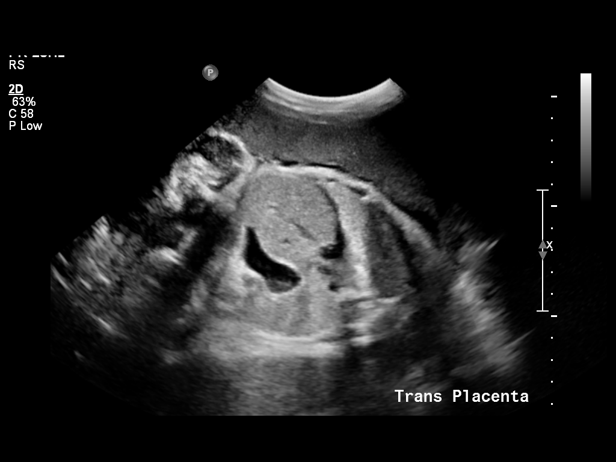
[im 22/41]
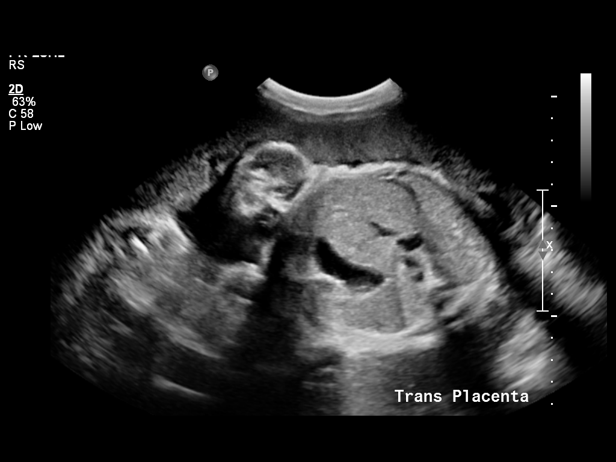
[im 25/41]
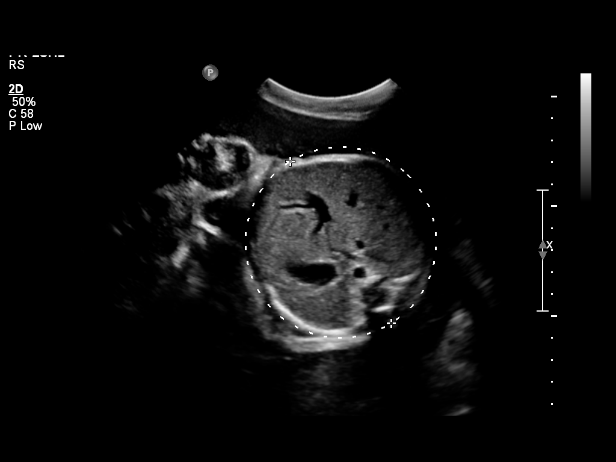
[im 28/41]
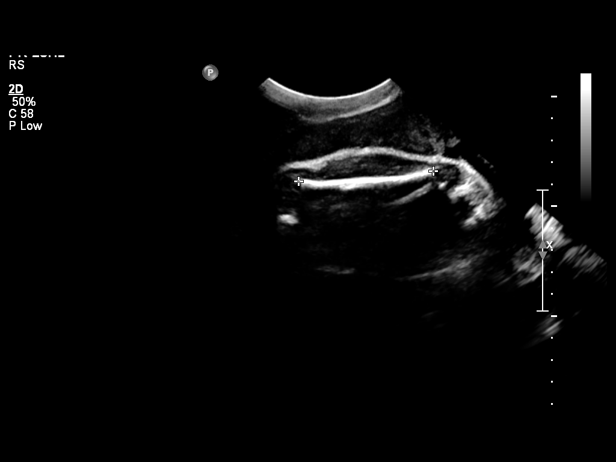
[im 31/41]
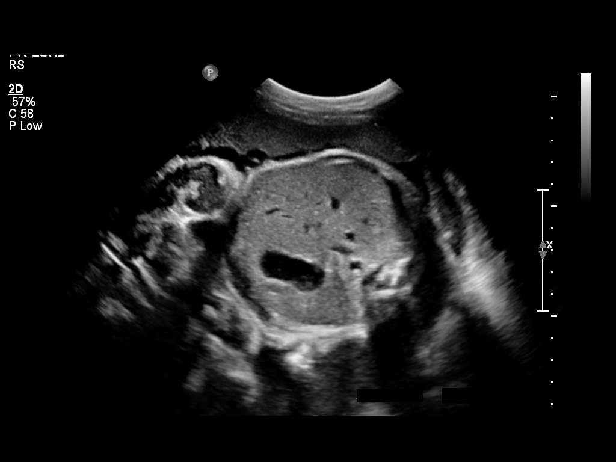
[im 34/41]
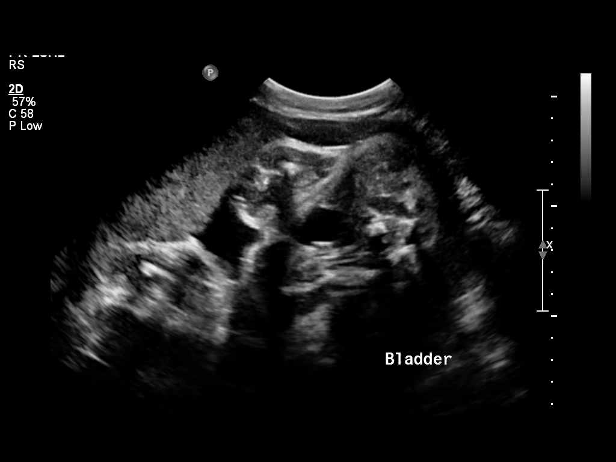
[im 37/41]
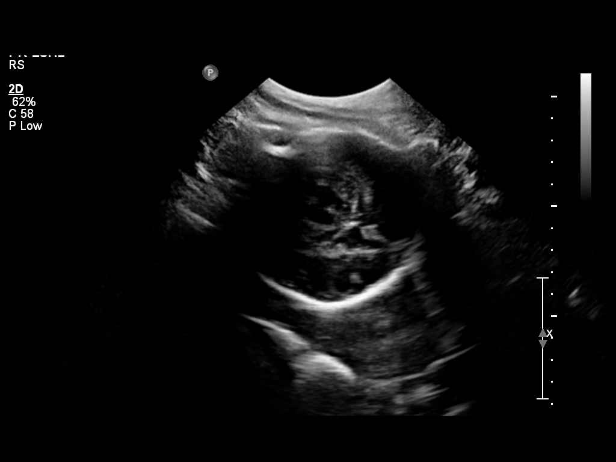
[im 41/41]
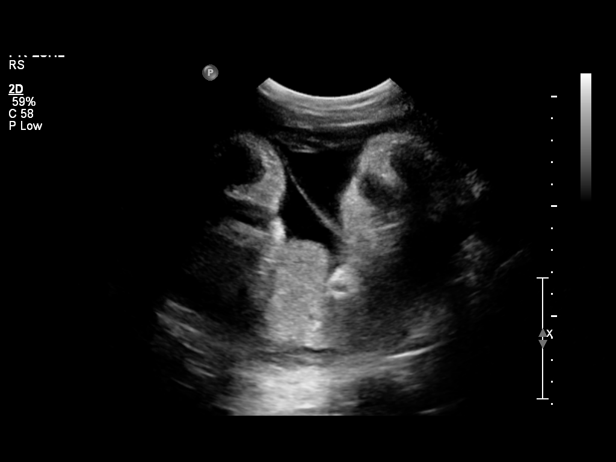

[15 of 28 positions shown; findings below may reference images not displayed]

OBSTETRICS REPORT
                      (Signed Final 05/04/2011 [DATE])

 Order#:         [id number]
                 97_O
Procedures

 US OB FOLLOW UP                                       76816.1
 US OB FOLLOW UP ADD'L GEST                            76816.2
Indications

 Assess Fetal Growth / Estimated Fetal Weight
 Twin gestation, Di-Di
 Poor obstetrical history (History of Pre-term Labor)
Fetal Evaluation (Fetus A)

 Fetal Heart Rate:  150                          bpm
 Cardiac Activity:  Observed
 Fetal Lie:         Right Fetus
 Presentation:      Cephalic
 Placenta:          Posterior, above cervical
                    os

 Membrane Desc:     Dividing
                    Membrane seen
                    - Dichorionic.

 Amniotic Fluid
 AFI FV:      Subjectively within normal limits
                                             Larg Pckt:     3.4  cm
Biometry (Fetus A)

 BPD:     80.7  mm     G. Age:  32w 3d                CI:        74.14   70 - 86
                                                      FL/HC:      21.9   19.9 -

 HC:     297.6  mm     G. Age:  33w 0d       15  %    HC/AC:      1.02   0.96 -

 AC:     290.6  mm     G. Age:  33w 0d       53  %    FL/BPD:     80.7   71 - 87
 FL:      65.1  mm     G. Age:  33w 4d       54  %    FL/AC:      22.4   20 - 24

 Est. FW:    0501  gm    4 lb 11 oz      61  %     FW Discordancy     0 \ 12 %
Gestational Age (Fetus A)

 LMP:           30w 5d        Date:  10/01/10                 EDD:   07/08/11
 U/S Today:     33w 0d                                        EDD:   06/22/11
 Best:          33w 0d     Det. By:  U/S Fetus A (01/09/11)   EDD:   06/22/11
Anatomy (Fetus A)

 Cranium:           Appears normal      Aortic Arch:       Previously seen
 Fetal Cavum:       Previously seen     Ductal Arch:       Previously seen
 Ventricles:        Appears normal      Diaphragm:         Appears normal
 Choroid Plexus:    Appears normal      Stomach:           Appears
                                                           normal, left
                                                           sided
 Cerebellum:        Previously seen     Abdomen:           Appears normal
 Posterior Fossa:   Previously seen     Abdominal Wall:    Previously seen
 Nuchal Fold:       Previously seen     Cord Vessels:      Previously seen
 Face:              Previously seen     Kidneys:           Appear normal
 Heart:             Not well            Bladder:           Appears normal
                    visualized
                    today. Seen
                    previously.
 RVOT:              Previously seen     Spine:             Previously seen
 LVOT:              Previously seen     Limbs:             Previously seen

 Other:     Fetus appears to be a male previoulsy seen.  Heels and
            5th digit previously visualized. Nasal bone previously
            visualized.

Fetal Evaluation (Fetus B)

 Fetal Heart Rate:  134                          bpm
 Cardiac Activity:  Observed
 Fetal Lie:         Left Fetus
 Presentation:      Cephalic
 Placenta:          Anterior, above cervical os

 Membrane Desc:     Dividing
                    Membrane seen
                    - Dichorionic.

 Amniotic Fluid
 AFI FV:      Subjectively within normal limits
                                             Larg Pckt:     3.5  cm
Biometry (Fetus B)

 BPD:     75.1  mm     G. Age:  30w 1d                CI:        66.96   70 - 86
                                                      FL/HC:      21.0   19.9 -

 HC:       294  mm     G. Age:  32w 4d        8  %    HC/AC:      1.05   0.96 -

 AC:     281.1  mm     G. Age:  32w 1d       26  %    FL/BPD:     82.3   71 - 87
 FL:      61.8  mm     G. Age:  32w 0d       17  %    FL/AC:      22.0   20 - 24

 Est. FW:    6821  gm      4 lb 2 oz     38  %     FW Discordancy        12  %
Gestational Age (Fetus B)

 LMP:           30w 5d        Date:  10/01/10                 EDD:   07/08/11
 U/S Today:     31w 5d                                        EDD:   07/01/11
 Best:          33w 0d     Det. By:  U/S Fetus A (01/09/11)   EDD:   06/22/11
Anatomy (Fetus B)

 Cranium:           Appears normal      Aortic Arch:       Previously seen
 Fetal Cavum:       Appears normal      Ductal Arch:       Not well
                                                           visualized
 Ventricles:        Appears normal      Diaphragm:         Appears normal
 Choroid Plexus:    Appears normal      Stomach:           Appears
                                                           normal, left
                                                           sided
 Cerebellum:        Previously seen     Abdomen:           Appears normal
 Posterior Fossa:   Previously seen     Abdominal Wall:    Previously seen
 Nuchal Fold:       Previously seen     Cord Vessels:      Previously seen
 Face:              Previously seen     Kidneys:           Appear normal
 Heart:             Not well            Bladder:           Appears normal
                    visualized
                    today. Seen
                    previously.
 RVOT:              Previously seen     Spine:             Previously seen
 LVOT:              Previously seen     Limbs:             Previously seen

 Other:     Fetus appears to be a female. Heels and 5th digit
            visualized previously seen. Technically difficult due to
            fetal position.
Cervix Uterus Adnexa

 Cervical Length:    3.7      cm

 Cervix:       Closed.

 Adnexa:     No abnormality visualized.
Impression

 Diamniotic, dichorionic twin gestation with twin A
 demonstrating an EGA by US of 33w 0d and twin B
 demonstrating an EGA by US of 31w 5d. Correlation with
 expected EGA by early ultrasound of 33w 0d. EFW is
 currently at the 61% for twin A and 38% for twin B with 12%
 discordance. Continued close follow up for growth is
 recommended..

 No late developing anatomic abnormalities are seen
 associated with the lateral ventricles, stomach, kidney or
 bladder for either twin. The four chamber heart could not be
 well seen for either twin today.

 Subjectively and quantitatively normal amniotic fluid volume
 noted for both twins.

 Normal cervical length and appearance.

 questions or concerns.

## 2013-06-04 ENCOUNTER — Ambulatory Visit: Payer: Medicaid Other

## 2013-06-05 ENCOUNTER — Encounter (HOSPITAL_COMMUNITY): Payer: Self-pay | Admitting: Emergency Medicine

## 2013-06-05 ENCOUNTER — Emergency Department (HOSPITAL_COMMUNITY)
Admission: EM | Admit: 2013-06-05 | Discharge: 2013-06-05 | Disposition: A | Discharge status: Home / Self Care | Payer: Medicaid Other | Attending: Emergency Medicine | Admitting: Emergency Medicine

## 2013-06-05 DIAGNOSIS — Z8659 Personal history of other mental and behavioral disorders: Secondary | ICD-10-CM | POA: Insufficient documentation

## 2013-06-05 DIAGNOSIS — H00019 Hordeolum externum unspecified eye, unspecified eyelid: Secondary | ICD-10-CM | POA: Insufficient documentation

## 2013-06-05 DIAGNOSIS — Z8614 Personal history of Methicillin resistant Staphylococcus aureus infection: Secondary | ICD-10-CM | POA: Insufficient documentation

## 2013-06-05 DIAGNOSIS — Z79899 Other long term (current) drug therapy: Secondary | ICD-10-CM | POA: Insufficient documentation

## 2013-06-05 DIAGNOSIS — H5789 Other specified disorders of eye and adnexa: Secondary | ICD-10-CM | POA: Insufficient documentation

## 2013-06-05 DIAGNOSIS — H00015 Hordeolum externum left lower eyelid: Secondary | ICD-10-CM

## 2013-06-05 DIAGNOSIS — D649 Anemia, unspecified: Secondary | ICD-10-CM | POA: Insufficient documentation

## 2013-06-05 MED ORDER — ERYTHROMYCIN 5 MG/GM OP OINT
TOPICAL_OINTMENT | OPHTHALMIC | Status: DC
  Administered 2013-06-05: 15:00:00 via OPHTHALMIC
  Filled 2013-06-05: qty 1

## 2013-06-05 NOTE — ED Provider Notes (Signed)
CSN: [id number]     Arrival date & time 06/05/13  1327 History  This chart was scribed for Jaynie Crumble, PA working with Hurman Horn, MD by Quintella Reichert, ED Scribe. This patient was seen in room TR04C/TR04C and the patient's care was started at 2:19 PM.  Chief Complaint  Patient presents with  . Eye Problem    The history is provided by the patient. No language interpreter was used.    HPI Comments: [name] is a 27 y.o. female who presents to the Emergency Department complaining of left lower eyelid pain and swelling that she first noticed 2 days ago on waking.  Pt states "I have a stye" and notes that she has had the same at least 2 times before.  She has attempted to treat symptoms with hot water, without relief.  She denies visual changes, injury, or any other issues.  She has had past styes treated successfully with erythromycin ointment.     Past Medical History  Diagnosis Date  . Abnormal Pap smear   . Anemia   . Mental disorder     hx of pp depression  . STD (female)     chlamydia and trich  . Depression   . Hx MRSA infection     Past Surgical History  Procedure Laterality Date  . No past surgeries    . Cervical biopsy  w/ loop electrode excision      Family History  Problem Relation Age of Onset  . Cancer Paternal Grandmother   . Hypertension Maternal Grandmother   . Heart disease Maternal Grandmother   . Diabetes Maternal Grandmother   . Stroke Maternal Grandmother   . Cancer Maternal Grandfather   . Hypertension Mother     History  Substance Use Topics  . Smoking status: Never Smoker   . Smokeless tobacco: Never Used  . Alcohol Use: No    OB History   Grav Para Term Preterm Abortions TAB SAB Ect Mult Living   4 4 4  0 0 0 0 0 1 5      Review of Systems  Eyes: Negative for visual disturbance.       Pain and swelling to left lower eyelid     Allergies  Review of patient's allergies indicates no known allergies.  Home  Medications   Current Outpatient Rx  Name  Route  Sig  Dispense  Refill  . ferrous sulfate 325 (65 FE) MG tablet   Oral   Take 325 mg by mouth daily with breakfast.         . medroxyPROGESTERone (DEPO-PROVERA) 150 MG/ML injection   Intramuscular   Inject 150 mg into the muscle every 3 (three) months.         . Prenatal Vit-Fe Fumarate-FA (MULTIVITAMIN-PRENATAL) 27-0.8 MG TABS   Oral   Take 1 tablet by mouth daily.            BP 100/68  Pulse 75  Temp(Src) 97.6 F (36.4 C) (Oral)  Resp 18  SpO2 100%  Physical Exam  Nursing note and vitals reviewed. Constitutional: She is oriented to person, place, and time. She appears well-developed and well-nourished. No distress.  HENT:  Head: Normocephalic and atraumatic.  Eyes: Conjunctivae and EOM are normal. Pupils are equal, round, and reactive to light.  Swelling to the lower left eyelid with a small pustule.  Tender to palpation.  No periorbital swelling or tenderness.  No pain with EOMs.  Neck: Neck supple. No tracheal deviation  present.  Cardiovascular: Normal rate.   Pulmonary/Chest: Effort normal. No respiratory distress.  Musculoskeletal: Normal range of motion.  Neurological: She is alert and oriented to person, place, and time.  Skin: Skin is warm and dry.  Psychiatric: She has a normal mood and affect. Her behavior is normal.    ED Course  Procedures (including critical care time)  DIAGNOSTIC STUDIES: Oxygen Saturation is 100% on room air, normal by my interpretation.    COORDINATION OF CARE: 2:22 PM-Discussed treatment plan which includes erythromycin ointment with pt at bedside and pt agreed to plan.    Labs Review Labs Reviewed - No data to display  Imaging Review No results found.  EKG Interpretation   None       MDM   1. Hordeolum externum of left lower eyelid     Patient with left lower eyelid hordeolum, no other abnormalities seen. Patient is afebrile and nontoxic appearing. No  periorbital swelling or tenderness. No pain with extraocular movements. Will treat compresses and erythromycin ointment. Followup with eye specialist if worsening.   I personally performed the services described in this documentation, which was scribed in my presence. The recorded information has been reviewed and is accurate.   Lottie Mussel, PA-C 06/05/13 1436

## 2013-06-05 NOTE — ED Notes (Signed)
Pt c/o left eye pain and discharge with irritation and some swelling to lower lid; pt sts x 2 days and denies injury

## 2013-06-08 NOTE — ED Provider Notes (Signed)
Medical screening examination/treatment/procedure(s) were performed by non-physician practitioner and as supervising physician I was immediately available for consultation/collaboration.  Aryianna Earwood M Victoria Euceda, MD 06/08/13 1907 

## 2013-06-11 ENCOUNTER — Ambulatory Visit (INDEPENDENT_AMBULATORY_CARE_PROVIDER_SITE_OTHER): Payer: Medicaid Other | Admitting: General Practice

## 2013-06-11 VITALS — BP 128/82 | HR 82 | Temp 97.9°F | Ht 68.0 in | Wt 143.2 lb

## 2013-06-11 DIAGNOSIS — IMO0001 Reserved for inherently not codable concepts without codable children: Secondary | ICD-10-CM

## 2013-06-11 DIAGNOSIS — Z3049 Encounter for surveillance of other contraceptives: Secondary | ICD-10-CM

## 2013-06-11 MED ORDER — MEDROXYPROGESTERONE ACETATE 104 MG/0.65ML ~~LOC~~ SUSP
104.0000 mg | Freq: Once | SUBCUTANEOUS | Status: AC
  Administered 2013-06-11: 104 mg via SUBCUTANEOUS

## 2013-09-03 ENCOUNTER — Ambulatory Visit: Payer: Medicaid Other

## 2013-09-17 ENCOUNTER — Ambulatory Visit: Payer: Medicaid Other

## 2013-10-16 ENCOUNTER — Ambulatory Visit (INDEPENDENT_AMBULATORY_CARE_PROVIDER_SITE_OTHER): Payer: Medicaid Other | Admitting: General Practice

## 2013-10-16 VITALS — BP 116/77 | HR 76 | Temp 97.8°F | Ht 73.0 in | Wt 151.1 lb

## 2013-10-16 DIAGNOSIS — Z01812 Encounter for preprocedural laboratory examination: Secondary | ICD-10-CM

## 2013-10-16 DIAGNOSIS — IMO0001 Reserved for inherently not codable concepts without codable children: Secondary | ICD-10-CM

## 2013-10-16 DIAGNOSIS — Z309 Encounter for contraceptive management, unspecified: Secondary | ICD-10-CM

## 2013-10-16 LAB — POCT PREGNANCY, URINE: PREG TEST UR: NEGATIVE

## 2013-10-16 MED ORDER — MEDROXYPROGESTERONE ACETATE 104 MG/0.65ML ~~LOC~~ SUSP
104.0000 mg | Freq: Once | SUBCUTANEOUS | Status: AC
  Administered 2013-10-16: 104 mg via SUBCUTANEOUS

## 2013-10-16 NOTE — Progress Notes (Signed)
Patient here for depo today, last dose 11/5 or 1565w1d ago. Patient states she has not had sex in >6 months. Obtained negative upt, will proceed with depo injection

## 2013-10-30 ENCOUNTER — Ambulatory Visit (INDEPENDENT_AMBULATORY_CARE_PROVIDER_SITE_OTHER): Payer: Medicaid Other | Admitting: Nurse Practitioner

## 2013-10-30 ENCOUNTER — Other Ambulatory Visit (HOSPITAL_COMMUNITY)
Admission: RE | Admit: 2013-10-30 | Discharge: 2013-10-30 | Disposition: A | Discharge status: Home / Self Care | Payer: Medicaid Other | Source: Ambulatory Visit | Attending: Nurse Practitioner | Admitting: Nurse Practitioner

## 2013-10-30 ENCOUNTER — Encounter: Payer: Self-pay | Admitting: Nurse Practitioner

## 2013-10-30 VITALS — BP 127/84 | HR 97 | Temp 98.0°F | Ht 69.0 in | Wt 151.6 lb

## 2013-10-30 DIAGNOSIS — Z01419 Encounter for gynecological examination (general) (routine) without abnormal findings: Secondary | ICD-10-CM | POA: Insufficient documentation

## 2013-10-30 DIAGNOSIS — R87619 Unspecified abnormal cytological findings in specimens from cervix uteri: Secondary | ICD-10-CM

## 2013-10-30 DIAGNOSIS — Z113 Encounter for screening for infections with a predominantly sexual mode of transmission: Secondary | ICD-10-CM | POA: Insufficient documentation

## 2013-10-30 DIAGNOSIS — Z309 Encounter for contraceptive management, unspecified: Secondary | ICD-10-CM

## 2013-10-30 DIAGNOSIS — IMO0001 Reserved for inherently not codable concepts without codable children: Secondary | ICD-10-CM

## 2013-10-30 DIAGNOSIS — Z0000 Encounter for general adult medical examination without abnormal findings: Secondary | ICD-10-CM

## 2013-10-30 NOTE — Patient Instructions (Signed)
Contraception Choices Contraception (birth control) is the use of any methods or devices to prevent pregnancy. Below are some methods to help avoid pregnancy. HORMONAL METHODS   Contraceptive implant This is a thin, plastic tube containing progesterone hormone. It does not contain estrogen hormone. Your health care provider inserts the tube in the inner part of the upper arm. The tube can remain in place for up to 3 years. After 3 years, the implant must be removed. The implant prevents the ovaries from releasing an egg (ovulation), thickens the cervical mucus to prevent sperm from entering the uterus, and thins the lining of the inside of the uterus.  Progesterone-only injections These injections are given every 3 months by your health care provider to prevent pregnancy. This synthetic progesterone hormone stops the ovaries from releasing eggs. It also thickens cervical mucus and changes the uterine lining. This makes it harder for sperm to survive in the uterus.  Birth control pills These pills contain estrogen and progesterone hormone. They work by preventing the ovaries from releasing eggs (ovulation). They also cause the cervical mucus to thicken, preventing the sperm from entering the uterus. Birth control pills are prescribed by a health care provider.Birth control pills can also be used to treat heavy periods.  Minipill This type of birth control pill contains only the progesterone hormone. They are taken every day of each month and must be prescribed by your health care provider.  Birth control patch The patch contains hormones similar to those in birth control pills. It must be changed once a week and is prescribed by a health care provider.  Vaginal ring The ring contains hormones similar to those in birth control pills. It is left in the vagina for 3 weeks, removed for 1 week, and then a new one is put back in place. The patient must be comfortable inserting and removing the ring from the  vagina.A health care provider's prescription is necessary.  Emergency contraception Emergency contraceptives prevent pregnancy after unprotected sexual intercourse. This pill can be taken right after sex or up to 5 days after unprotected sex. It is most effective the sooner you take the pills after having sexual intercourse. Most emergency contraceptive pills are available without a prescription. Check with your pharmacist. Do not use emergency contraception as your only form of birth control. BARRIER METHODS   Female condom This is a thin sheath (latex or rubber) that is worn over the penis during sexual intercourse. It can be used with spermicide to increase effectiveness.  Female condom. This is a soft, loose-fitting sheath that is put into the vagina before sexual intercourse.  Diaphragm This is a soft, latex, dome-shaped barrier that must be fitted by a health care provider. It is inserted into the vagina, along with a spermicidal jelly. It is inserted before intercourse. The diaphragm should be left in the vagina for 6 to 8 hours after intercourse.  Cervical cap This is a round, soft, latex or plastic cup that fits over the cervix and must be fitted by a health care provider. The cap can be left in place for up to 48 hours after intercourse.  Sponge This is a soft, circular piece of polyurethane foam. The sponge has spermicide in it. It is inserted into the vagina after wetting it and before sexual intercourse.  Spermicides These are chemicals that kill or block sperm from entering the cervix and uterus. They come in the form of creams, jellies, suppositories, foam, or tablets. They do not require a   prescription. They are inserted into the vagina with an applicator before having sexual intercourse. The process must be repeated every time you have sexual intercourse. INTRAUTERINE CONTRACEPTION  Intrauterine device (IUD) This is a T-shaped device that is put in a woman's uterus during a  menstrual period to prevent pregnancy. There are 2 types:  Copper IUD This type of IUD is wrapped in copper wire and is placed inside the uterus. Copper makes the uterus and fallopian tubes produce a fluid that kills sperm. It can stay in place for 10 years.  Hormone IUD This type of IUD contains the hormone progestin (synthetic progesterone). The hormone thickens the cervical mucus and prevents sperm from entering the uterus, and it also thins the uterine lining to prevent implantation of a fertilized egg. The hormone can weaken or kill the sperm that get into the uterus. It can stay in place for 3 5 years, depending on which type of IUD is used. PERMANENT METHODS OF CONTRACEPTION  Female tubal ligation This is when the woman's fallopian tubes are surgically sealed, tied, or blocked to prevent the egg from traveling to the uterus.  Hysteroscopic sterilization This involves placing a small coil or insert into each fallopian tube. Your doctor uses a technique called hysteroscopy to do the procedure. The device causes scar tissue to form. This results in permanent blockage of the fallopian tubes, so the sperm cannot fertilize the egg. It takes about 3 months after the procedure for the tubes to become blocked. You must use another form of birth control for these 3 months.  Female sterilization This is when the female has the tubes that carry sperm tied off (vasectomy).This blocks sperm from entering the vagina during sexual intercourse. After the procedure, the man can still ejaculate fluid (semen). NATURAL PLANNING METHODS  Natural family planning This is not having sexual intercourse or using a barrier method (condom, diaphragm, cervical cap) on days the woman could become pregnant.  Calendar method This is keeping track of the length of each menstrual cycle and identifying when you are fertile.  Ovulation method This is avoiding sexual intercourse during ovulation.  Symptothermal method This is  avoiding sexual intercourse during ovulation, using a thermometer and ovulation symptoms.  Post ovulation method This is timing sexual intercourse after you have ovulated. Regardless of which type or method of contraception you choose, it is important that you use condoms to protect against the transmission of sexually transmitted infections (STIs). Talk with your health care provider about which form of contraception is most appropriate for you. Document Released: 07/24/2005 Document Revised: 03/26/2013 Document Reviewed: 01/16/2013 [employer] Patient Information 2014 [employer].  

## 2013-10-30 NOTE — Progress Notes (Signed)
History:  [name] is a 28 y.o. [phone] who [facility] for repeat pap and discussion of contraception. She is currently on Depo Provera but she feels it may make her depression worse. [facility].She would like to have her tubes tied. She is in same relationship for last 7 years and between them they have 9 children. She has had an abnormal pap in past and had LEEP with Dr Clearance Cootsharper. She has not had any abnormal's since then. She denies any issues with vaginal discharge or breasts.  The following portions of the patient's history were reviewed and updated as appropriate: allergies, current medications, past family history, past medical history, past social history, past surgical history and problem list.  Review of Systems:  Pertinent items are noted in HPI.  Objective:  Physical Exam BP 127/84  Pulse 97  Temp(Src) 98 F (36.7 C) (Oral)  Ht 5\' 9"  (1.753 m)  Wt 151 lb 9.6 oz (68.765 kg)  BMI 22.38 kg/m2 GENERAL: Well-developed, well-nourished female in no acute distress.  HEENT: Normocephalic, atraumatic.  NECK: Supple. Normal thyroid.  LUNGS: Normal rate. Clear to auscultation bilaterally.  HEART: Regular rate and rhythm with no adventitious sounds.  BREASTS: Symmetric in size. No masses, skin changes, nipple drainage, or lymphadenopathy. ABDOMEN: Soft, nontender, nondistended. No organomegaly. Normal bowel sounds appreciated in all quadrants.  PELVIC: Normal external female genitalia. Vagina is pink and rugated.  Normal discharge. Normal cervix contour. Pap smear obtained. Uterus is normal in size. No adnexal mass or tenderness.  EXTREMITIES: No cyanosis, clubbing, or edema, 2+ distal pulses.   Labs and Imaging No results found.  Assessment & Plan:  Assessment:  Well Woman Exam Contraception Management Depression  Plans:  Pap, GC, Chlamydia today/ will notify of results Continue Depo Provera  until BTL scheduled Schedule BTL  Ad[facility] Return to Clinic 1 year   Delbert PhenixLinda M Tira Lafferty, NP 10/30/2013 4:02 PM

## 2013-11-02 ENCOUNTER — Other Ambulatory Visit: Payer: Self-pay | Admitting: Obstetrics & Gynecology

## 2013-11-05 ENCOUNTER — Encounter: Payer: Self-pay | Admitting: *Deleted

## 2013-11-05 ENCOUNTER — Telehealth: Payer: Self-pay | Admitting: *Deleted

## 2013-11-05 MED ORDER — METRONIDAZOLE 500 MG PO TABS: 2000.0000 mg | ORAL_TABLET | Freq: Once | ORAL | Status: DC

## 2013-11-05 NOTE — Telephone Encounter (Signed)
Attempted to call patient, phone regarding states that number is not accepting phone calls at this time.

## 2013-11-05 NOTE — Telephone Encounter (Signed)
Spoke with patient concerning results,  Patient upset questioning how could she have this if she only has one partner.  Educated patient that it is sexually transmitted disease and passed from an infected partner to another person.  Medication ordered and sent to pharmacy.  Pt verbalizes understanding.

## 2013-11-05 NOTE — Telephone Encounter (Signed)
Message copied by Dorothyann PengHAIZLIP, Dequane Strahan E on Wed Nov 05, 2013  8:25 AM ------      Message from: BAREFOOT, LINDA M      Created: Tue Nov 04, 2013  5:20 PM       Needs to have treatment for Trich. Can we call in             Flagyl 2 Grams one time dose      And ask partner to be treated            Thanks, Bonita QuinLinda ------

## 2013-11-13 ENCOUNTER — Encounter (HOSPITAL_COMMUNITY): Payer: Self-pay | Admitting: Emergency Medicine

## 2013-11-13 DIAGNOSIS — Z8659 Personal history of other mental and behavioral disorders: Secondary | ICD-10-CM | POA: Insufficient documentation

## 2013-11-13 DIAGNOSIS — R197 Diarrhea, unspecified: Secondary | ICD-10-CM | POA: Insufficient documentation

## 2013-11-13 DIAGNOSIS — Z8614 Personal history of Methicillin resistant Staphylococcus aureus infection: Secondary | ICD-10-CM | POA: Insufficient documentation

## 2013-11-13 DIAGNOSIS — Z8619 Personal history of other infectious and parasitic diseases: Secondary | ICD-10-CM | POA: Insufficient documentation

## 2013-11-13 DIAGNOSIS — Z3202 Encounter for pregnancy test, result negative: Secondary | ICD-10-CM | POA: Insufficient documentation

## 2013-11-13 DIAGNOSIS — R112 Nausea with vomiting, unspecified: Secondary | ICD-10-CM | POA: Insufficient documentation

## 2013-11-13 DIAGNOSIS — Z8719 Personal history of other diseases of the digestive system: Secondary | ICD-10-CM | POA: Insufficient documentation

## 2013-11-13 DIAGNOSIS — Z862 Personal history of diseases of the blood and blood-forming organs and certain disorders involving the immune mechanism: Secondary | ICD-10-CM | POA: Insufficient documentation

## 2013-11-13 LAB — COMPREHENSIVE METABOLIC PANEL
ALT: 15 U/L (ref 0–35)
AST: 16 U/L (ref 0–37)
Albumin: 4.2 g/dL (ref 3.5–5.2)
Alkaline Phosphatase: 65 U/L (ref 39–117)
BUN: 12 mg/dL (ref 6–23)
CALCIUM: 9.4 mg/dL (ref 8.4–10.5)
CO2: 18 meq/L — AB (ref 19–32)
CREATININE: 0.82 mg/dL (ref 0.50–1.10)
Chloride: 103 mEq/L (ref 96–112)
GFR calc non Af Amer: >90 mL/min (ref >90)
GLUCOSE: 119 mg/dL — AB (ref 70–99)
Potassium: 4.1 mEq/L (ref 3.7–5.3)
Sodium: 139 mEq/L (ref 137–147)
Total Bilirubin: 0.9 mg/dL (ref 0.3–1.2)
Total Protein: 8 g/dL (ref 6.0–8.3)

## 2013-11-13 LAB — URINALYSIS, ROUTINE W REFLEX MICROSCOPIC
BILIRUBIN URINE: NEGATIVE
GLUCOSE, UA: NEGATIVE mg/dL
KETONES UR: 15 mg/dL — AB
Nitrite: NEGATIVE
PROTEIN: NEGATIVE mg/dL
Specific Gravity, Urine: 1.026 (ref 1.005–1.030)
UROBILINOGEN UA: 0.2 mg/dL (ref 0.0–1.0)
pH: 5.5 (ref 5.0–8.0)

## 2013-11-13 LAB — CBC WITH DIFFERENTIAL/PLATELET
BASOS ABS: 0 10*3/uL (ref 0.0–0.1)
Basophils Relative: 0 % (ref 0–1)
Eosinophils Absolute: 0.1 10*3/uL (ref 0.0–0.7)
Eosinophils Relative: 1 % (ref 0–5)
HEMATOCRIT: 42.1 % (ref 36.0–46.0)
Hemoglobin: 14.7 g/dL (ref 12.0–15.0)
LYMPHS PCT: 18 % (ref 12–46)
Lymphs Abs: 1.5 10*3/uL (ref 0.7–4.0)
MCH: 31 pg (ref 26.0–34.0)
MCHC: 34.9 g/dL (ref 30.0–36.0)
MCV: 88.8 fL (ref 78.0–100.0)
MONO ABS: 0.2 10*3/uL (ref 0.1–1.0)
Monocytes Relative: 2 % — ABNORMAL LOW (ref 3–12)
Neutro Abs: 6.7 10*3/uL (ref 1.7–7.7)
Neutrophils Relative %: 79 % — ABNORMAL HIGH (ref 43–77)
Platelets: 191 10*3/uL (ref 150–400)
RBC: 4.74 MIL/uL (ref 3.87–5.11)
RDW: 12.1 % (ref 11.5–15.5)
WBC: 8.5 10*3/uL (ref 4.0–10.5)

## 2013-11-13 LAB — URINE MICROSCOPIC-ADD ON

## 2013-11-13 LAB — PREGNANCY, URINE: Preg Test, Ur: NEGATIVE

## 2013-11-13 MED ORDER — ONDANSETRON 4 MG PO TBDP
8.0000 mg | ORAL_TABLET | Freq: Once | ORAL | Status: AC
  Administered 2013-11-13: 8 mg via ORAL
  Filled 2013-11-13: qty 2

## 2013-11-13 NOTE — ED Notes (Signed)
Pt states progressively worsening nausea vomiting and diarrhea since yesterday.

## 2013-11-14 ENCOUNTER — Emergency Department (HOSPITAL_COMMUNITY)
Admission: EM | Admit: 2013-11-14 | Discharge: 2013-11-14 | Disposition: A | Discharge status: Home / Self Care | Payer: Medicaid Other | Attending: Emergency Medicine | Admitting: Emergency Medicine

## 2013-11-14 DIAGNOSIS — R197 Diarrhea, unspecified: Secondary | ICD-10-CM

## 2013-11-14 DIAGNOSIS — R112 Nausea with vomiting, unspecified: Secondary | ICD-10-CM

## 2013-11-14 MED ORDER — SODIUM CHLORIDE 0.9 % IV SOLN: 1000.0000 mL | INTRAVENOUS | Status: DC

## 2013-11-14 MED ORDER — SODIUM CHLORIDE 0.9 % IV SOLN
1000.0000 mL | Freq: Once | INTRAVENOUS | Status: AC
  Administered 2013-11-14: 1000 mL via INTRAVENOUS

## 2013-11-14 MED ORDER — ONDANSETRON HCL 4 MG PO TABS: 4.0000 mg | ORAL_TABLET | Freq: Four times a day (QID) | ORAL | Status: DC | PRN

## 2013-11-14 MED ORDER — LOPERAMIDE HCL 2 MG PO CAPS
4.0000 mg | ORAL_CAPSULE | Freq: Once | ORAL | Status: AC
  Administered 2013-11-14: 4 mg via ORAL
  Filled 2013-11-14: qty 2

## 2013-11-14 MED ORDER — LOPERAMIDE HCL 2 MG PO CAPS: 2.0000 mg | ORAL_CAPSULE | Freq: Four times a day (QID) | ORAL | Status: DC | PRN

## 2013-11-14 NOTE — ED Provider Notes (Signed)
CSN: [id number]     Arrival date & time 11/13/13  2012 History   First MD Initiated Contact with Patient 11/14/13 0048     Chief Complaint  Patient presents with  . Emesis  . Diarrhea     (Consider location/radiation/quality/duration/timing/severity/associated sxs/prior Treatment) Patient is a 28 y.o. female presenting with vomiting and diarrhea. The history is provided by the patient.  Emesis Associated symptoms: diarrhea   Diarrhea Associated symptoms: vomiting   She has been having nausea and vomiting and diarrhea for the last 2 days. Symptoms are getting worse. She denies fever, chills, sweats. There has been some abdominal cramping and she rates pain at 8/10. There's been no blood or mucus in stool or emesis. She denies any sick contacts. She has not taken any medication to try and help her symptoms.  Past Medical History  Diagnosis Date  . Abnormal Pap smear   . Anemia   . Mental disorder     hx of pp depression  . STD (female)     chlamydia and trich  . Depression   . Hx MRSA infection    Past Surgical History  Procedure Laterality Date  . No past surgeries    . Cervical biopsy  w/ loop electrode excision     Family History  Problem Relation Age of Onset  . Cancer Paternal Grandmother   . Hypertension Maternal Grandmother   . Heart disease Maternal Grandmother   . Diabetes Maternal Grandmother   . Stroke Maternal Grandmother   . Cancer Maternal Grandfather   . Hypertension Mother    History  Substance Use Topics  . Smoking status: Never Smoker   . Smokeless tobacco: Never Used  . Alcohol Use: No   OB History   Grav Para Term Preterm Abortions TAB SAB Ect Mult Living   4 4 4  0 0 0 0 0 1 5     Review of Systems  Gastrointestinal: Positive for vomiting and diarrhea.  All other systems reviewed and are negative.     Allergies  Review of patient's allergies indicates no known allergies.  Home Medications   Current Outpatient Rx  Name  Route  Sig   Dispense  Refill  . metroNIDAZOLE (FLAGYL) 500 MG tablet   Oral   Take 4 tablets (2,000 mg total) by mouth once.   4 tablet   0   . medroxyPROGESTERone (DEPO-PROVERA) 150 MG/ML injection   Intramuscular   Inject 150 mg into the muscle every 3 (three) months.          BP 117/78  Pulse 88  Temp(Src) 97.8 F (36.6 C) (Oral)  Resp 18  Wt 148 lb 4 oz (67.246 kg)  SpO2 100% Physical Exam  Nursing note and vitals reviewed.  28 year old female, resting comfortably and in no acute distress. Vital signs are significant for tachycardia with heart rate 102. Oxygen saturation is 96%, which is normal. Head is normocephalic and atraumatic. PERRLA, EOMI. Oropharynx is clear. Neck is nontender and supple without adenopathy or JVD. Back is nontender and there is no CVA tenderness. Lungs are clear without rales, wheezes, or rhonchi. Chest is nontender. Heart has regular rate and rhythm without murmur. Abdomen is soft, flat, nontender without masses or hepatosplenomegaly and peristalsis is hypoactive. Extremities have no cyanosis or edema, full range of motion is present. Skin is warm and dry without rash. Neurologic: Mental status is normal, cranial nerves are intact, there are no motor or sensory deficits.  ED Course  Procedures (including critical care time) Labs Review Results for orders placed during the hospital encounter of 11/14/13  CBC WITH DIFFERENTIAL      Result Value Ref Range   WBC 8.5  4.0 - 10.5 K/uL   RBC 4.74  3.87 - 5.11 MIL/uL   Hemoglobin 14.7  12.0 - 15.0 g/dL   HCT 82.9  56.2 - 13.0 %   MCV 88.8  78.0 - 100.0 fL   MCH 31.0  26.0 - 34.0 pg   MCHC 34.9  30.0 - 36.0 g/dL   RDW 86.5  78.4 - 69.6 %   Platelets 191  150 - 400 K/uL   Neutrophils Relative % 79 (*) 43 - 77 %   Neutro Abs 6.7  1.7 - 7.7 K/uL   Lymphocytes Relative 18  12 - 46 %   Lymphs Abs 1.5  0.7 - 4.0 K/uL   Monocytes Relative 2 (*) 3 - 12 %   Monocytes Absolute 0.2  0.1 - 1.0 K/uL   Eosinophils  Relative 1  0 - 5 %   Eosinophils Absolute 0.1  0.0 - 0.7 K/uL   Basophils Relative 0  0 - 1 %   Basophils Absolute 0.0  0.0 - 0.1 K/uL  COMPREHENSIVE METABOLIC PANEL      Result Value Ref Range   Sodium 139  137 - 147 mEq/L   Potassium 4.1  3.7 - 5.3 mEq/L   Chloride 103  96 - 112 mEq/L   CO2 18 (*) 19 - 32 mEq/L   Glucose, Bld 119 (*) 70 - 99 mg/dL   BUN 12  6 - 23 mg/dL   Creatinine, Ser 2.95  0.50 - 1.10 mg/dL   Calcium 9.4  8.4 - 28.4 mg/dL   Total Protein 8.0  6.0 - 8.3 g/dL   Albumin 4.2  3.5 - 5.2 g/dL   AST 16  0 - 37 U/L   ALT 15  0 - 35 U/L   Alkaline Phosphatase 65  39 - 117 U/L   Total Bilirubin 0.9  0.3 - 1.2 mg/dL   GFR calc non Af Amer >90  >90 mL/min   GFR calc Af Amer >90  >90 mL/min  URINALYSIS, ROUTINE W REFLEX MICROSCOPIC      Result Value Ref Range   Color, Urine YELLOW  YELLOW   APPearance CLOUDY (*) CLEAR   Specific Gravity, Urine 1.026  1.005 - 1.030   pH 5.5  5.0 - 8.0   Glucose, UA NEGATIVE  NEGATIVE mg/dL   Hgb urine dipstick MODERATE (*) NEGATIVE   Bilirubin Urine NEGATIVE  NEGATIVE   Ketones, ur 15 (*) NEGATIVE mg/dL   Protein, ur NEGATIVE  NEGATIVE mg/dL   Urobilinogen, UA 0.2  0.0 - 1.0 mg/dL   Nitrite NEGATIVE  NEGATIVE   Leukocytes, UA TRACE (*) NEGATIVE  PREGNANCY, URINE      Result Value Ref Range   Preg Test, Ur NEGATIVE  NEGATIVE  URINE MICROSCOPIC-ADD ON      Result Value Ref Range   Squamous Epithelial / LPF FEW (*) RARE   WBC, UA 0-2  <3 WBC/hpf   RBC / HPF 0-2  <3 RBC/hpf   Bacteria, UA RARE  RARE   Urine-Other MUCOUS PRESENT     MDM   Final diagnoses:  Nausea vomiting and diarrhea    Nausea, vomiting, diarrhea most consistent with viral gastroenteritis. She does appear to be mildly dehydrated as urine specific gravity is 1.36 and she has mild tachycardia  and a small amount of ketones in her urine. She'll be given IV fluids. She was given a dose of ondansetron at triage and states nausea is improved. She'll be given a dose  of loperamide.  She feels much better after above noted treatment. She is discharged with prescriptions for ondansetron and loperamide.  Dione Booze, MD 11/14/13 [phone]

## 2013-11-14 NOTE — Discharge Instructions (Signed)
Nausea and Vomiting °Nausea is a sick feeling that often comes before throwing up (vomiting). Vomiting is a reflex where stomach contents come out of your mouth. Vomiting can cause severe loss of body fluids (dehydration). Children and elderly adults can become dehydrated quickly, especially if they also have diarrhea. Nausea and vomiting are symptoms of a condition or disease. It is important to find the cause of your symptoms. °CAUSES  °· Direct irritation of the stomach lining. This irritation can result from increased acid production (gastroesophageal reflux disease), infection, food poisoning, taking certain medicines (such as nonsteroidal anti-inflammatory drugs), alcohol use, or tobacco use. °· Signals from the brain. These signals could be caused by a headache, heat exposure, an inner ear disturbance, increased pressure in the brain from injury, infection, a tumor, or a concussion, pain, emotional stimulus, or metabolic problems. °· An obstruction in the gastrointestinal tract (bowel obstruction). °· Illnesses such as diabetes, hepatitis, gallbladder problems, appendicitis, kidney problems, cancer, sepsis, atypical symptoms of a heart attack, or eating disorders. °· Medical treatments such as chemotherapy and radiation. °· Receiving medicine that makes you sleep (general anesthetic) during surgery. °DIAGNOSIS °Your caregiver may ask for tests to be done if the problems do not improve after a few days. Tests may also be done if symptoms are severe or if the reason for the nausea and vomiting is not clear. Tests may include: °· Urine tests. °· Blood tests. °· Stool tests. °· Cultures (to look for evidence of infection). °· X-rays or other imaging studies. °Test results can help your caregiver make decisions about treatment or the need for additional tests. °TREATMENT °You need to stay well hydrated. Drink frequently but in small amounts. You may wish to drink water, sports drinks, clear broth, or eat frozen  ice pops or gelatin dessert to help stay hydrated. When you eat, eating slowly may help prevent nausea. There are also some antinausea medicines that may help prevent nausea. °HOME CARE INSTRUCTIONS  °· Take all medicine as directed by your caregiver. °· If you do not have an appetite, do not force yourself to eat. However, you must continue to drink fluids. °· If you have an appetite, eat a normal diet unless your caregiver tells you differently. °· Eat a variety of complex carbohydrates (rice, wheat, potatoes, bread), lean meats, yogurt, fruits, and vegetables. °· Avoid high-fat foods because they are more difficult to digest. °· Drink enough water and fluids to keep your urine clear or pale yellow. °· If you are dehydrated, ask your caregiver for specific rehydration instructions. Signs of dehydration may include: °· Severe thirst. °· Dry lips and mouth. °· Dizziness. °· Dark urine. °· Decreasing urine frequency and amount. °· Confusion. °· Rapid breathing or pulse. °SEEK IMMEDIATE MEDICAL CARE IF:  °· You have blood or brown flecks (like coffee grounds) in your vomit. °· You have black or bloody stools. °· You have a severe headache or stiff neck. °· You are confused. °· You have severe abdominal pain. °· You have chest pain or trouble breathing. °· You do not urinate at least once every 8 hours. °· You develop cold or clammy skin. °· You continue to vomit for longer than 24 to 48 hours. °· You have a fever. °MAKE SURE YOU:  °· Understand these instructions. °· Will watch your condition. °· Will get help right away if you are not doing well or get worse. °Document Released: 07/24/2005 Document Revised: 10/16/2011 Document Reviewed: 12/21/2010 °ExitCare® Patient Information ©2014 [employer]. ° °Diarrhea °Diarrhea is frequent   loose and watery bowel movements. It can cause you to feel weak and dehydrated. Dehydration can cause you to become tired and thirsty, have a dry mouth, and have decreased urination that  often is dark yellow. Diarrhea is a sign of another problem, most often an infection that will not last long. In most cases, diarrhea typically lasts 2 3 days. However, it can last longer if it is a sign of something more serious. It is important to treat your diarrhea as directed by your caregive to lessen or prevent future episodes of diarrhea. CAUSES  Some common causes include:  Gastrointestinal infections caused by viruses, bacteria, or parasites.  Food poisoning or food allergies.  Certain medicines, such as antibiotics, chemotherapy, and laxatives.  Artificial sweeteners and fructose.  Digestive disorders. HOME CARE INSTRUCTIONS  Ensure adequate fluid intake (hydration): have 1 cup (8 oz) of fluid for each diarrhea episode. Avoid fluids that contain simple sugars or sports drinks, fruit juices, whole milk products, and sodas. Your urine should be clear or pale yellow if you are drinking enough fluids. Hydrate with an oral rehydration solution that you can purchase at pharmacies, retail stores, and online. You can prepare an oral rehydration solution at home by mixing the following ingredients together:    tsp table salt.   tsp baking soda.   tsp salt substitute containing potassium chloride.  1  tablespoons sugar.  1 L (34 oz) of water.  Certain foods and beverages may increase the speed at which food moves through the gastrointestinal (GI) tract. These foods and beverages should be avoided and include:  Caffeinated and alcoholic beverages.  High-fiber foods, such as raw fruits and vegetables, nuts, seeds, and whole grain breads and cereals.  Foods and beverages sweetened with sugar alcohols, such as xylitol, sorbitol, and mannitol.  Some foods may be well tolerated and may help thicken stool including:  Starchy foods, such as rice, toast, pasta, low-sugar cereal, oatmeal, grits, baked potatoes, crackers, and bagels.  Bananas.  Applesauce.  Add probiotic-rich foods  to help increase healthy bacteria in the GI tract, such as yogurt and fermented milk products.  Wash your hands well after each diarrhea episode.  Only take over-the-counter or prescription medicines as directed by your caregiver.  Take a warm bath to relieve any burning or pain from frequent diarrhea episodes. SEEK IMMEDIATE MEDICAL CARE IF:   You are unable to keep fluids down.  You have persistent vomiting.  You have blood in your stool, or your stools are black and tarry.  You do not urinate in 6 8 hours, or there is only a small amount of very dark urine.  You have abdominal pain that increases or localizes.  You have weakness, dizziness, confusion, or lightheadedness.  You have a severe headache.  Your diarrhea gets worse or does not get better.  You have a fever or persistent symptoms for more than 2 3 days.  You have a fever and your symptoms suddenly get worse. MAKE SURE YOU:   Understand these instructions.  Will watch your condition.  Will get help right away if you are not doing well or get worse. Document Released: 07/14/2002 Document Revised: 07/10/2012 Document Reviewed: 03/31/2012 [facility] Patient Information 2014 [place], [place].  Ondansetron tablets What is this medicine? ONDANSETRON (on DAN se tron) is used to treat nausea and vomiting caused by chemotherapy. It is also used to prevent or treat nausea and vomiting after surgery. This medicine may be used for other purposes; ask your health  care provider or pharmacist if you have questions. COMMON BRAND NAME(S): Zofran What should I tell my health care provider before I take this medicine? They need to know if you have any of these conditions: -heart disease -history of irregular heartbeat -liver disease -low levels of magnesium or potassium in the blood -an unusual or allergic reaction to ondansetron, granisetron, other medicines, foods, dyes, or preservatives -pregnant or trying to get  pregnant -breast-feeding How should I use this medicine? Take this medicine by mouth with a glass of water. Follow the directions on your prescription label. Take your doses at regular intervals. Do not take your medicine more often than directed. Talk to your pediatrician regarding the use of this medicine in children. Special care may be needed. Overdosage: If you think you have taken too much of this medicine contact a poison control center or emergency room at once. NOTE: This medicine is only for you. Do not share this medicine with others. What if I miss a dose? If you miss a dose, take it as soon as you can. If it is almost time for your next dose, take only that dose. Do not take double or extra doses. What may interact with this medicine? Do not take this medicine with any of the following medications: -apomorphine -certain medicines for fungal infections like fluconazole, itraconazole, ketoconazole, posaconazole, voriconazole -cisapride -dofetilide -dronedarone -pimozide -thioridazine -ziprasidone  This medicine may also interact with the following medications: -carbamazepine -certain medicines for depression, anxiety, or psychotic disturbances -fentanyl -linezolid -MAOIs like Carbex, Eldepryl, Marplan, Nardil, and Parnate -methylene blue (injected into a vein) -other medicines that prolong the QT interval (cause an abnormal heart rhythm) -phenytoin -rifampicin -tramadol This list may not describe all possible interactions. Give your health care provider a list of all the medicines, herbs, non-prescription drugs, or dietary supplements you use. Also tell them if you smoke, drink alcohol, or use illegal drugs. Some items may interact with your medicine. What should I watch for while using this medicine? Check with your doctor or health care professional right away if you have any sign of an allergic reaction. What side effects may I notice from receiving this medicine? Side  effects that you should report to your doctor or health care professional as soon as possible: -allergic reactions like skin rash, itching or hives, swelling of the face, lips or tongue -breathing problems -confusion -dizziness -fast or irregular heartbeat -feeling faint or lightheaded, falls -fever and chills -loss of balance or coordination -seizures -sweating -swelling of the hands or feet -tightness in the chest -tremors -unusually weak or tired Side effects that usually do not require medical attention (report to your doctor or health care professional if they continue or are bothersome): -constipation or diarrhea -headache This list may not describe all possible side effects. Call your doctor for medical advice about side effects. You may report side effects to FDA at 1-800-FDA-1088. Where should I keep my medicine? Keep out of the reach of children. Store between 2 and 30 degrees C (36 and 86 degrees F). Throw away any unused medicine after the expiration date. NOTE: This sheet is a summary. It may not cover all possible information. If you have questions about this medicine, talk to your doctor, pharmacist, or health care provider.  2014, Elsevier/Gold Standard. (2013-04-30 16:27:45)  Loperamide tablets or capsules What is this medicine? LOPERAMIDE (loe PER a mide) is used to treat diarrhea. This medicine may be used for other purposes; ask your health care provider or  pharmacist if you have questions. COMMON BRAND NAME(S): Anti-Diarrheal, Imodium A-D, K-Pek II What should I tell my health care provider before I take this medicine? They need to know if you have any of these conditions: -a black or bloody stool -bacterial food poisoning -colitis or mucus in your stool -currently taking an antibiotic medication for an infection -fever -liver disease -severe abdominal pain, swelling or bulging -an unusual or allergic reaction to loperamide, other medicines, foods, dyes,  or preservatives -pregnant or trying to get pregnant -breast-feeding How should I use this medicine? Take this medicine by mouth with a glass of water. Follow the directions on the prescription label. Take your doses at regular intervals. Do not take your medicine more often than directed. Talk to your pediatrician regarding the use of this medicine in children. Special care may be needed. Overdosage: If you think you have taken too much of this medicine contact a poison control center or emergency room at once. NOTE: This medicine is only for you. Do not share this medicine with others. What if I miss a dose? This does not apply. This medicine is not for regular use. Only take this medicine while you continue to have loose bowel movements. Do not take more medicine than recommended by the packaging label or by your healthcare professional. What may interact with this medicine? Do not take this medicine with any of the following medications: -alosetron This medicine may also interact with the following medications: -quinidine -ritonavir -saquinavir This list may not describe all possible interactions. Give your health care provider a list of all the medicines, herbs, non-prescription drugs, or dietary supplements you use. Also tell them if you smoke, drink alcohol, or use illegal drugs. Some items may interact with your medicine. What should I watch for while using this medicine? Do not take this medicine for more than 1 week without asking your doctor or health care professional. If your symptoms do not start to get better after two days, you may have a problem that needs further evaluation. Check with your doctor or health care professional right away if you develop a fever, severe abdominal pain, swelling or bulging, or if you have have bloody/black diarrhea or stools. You may get drowsy or dizzy. Do not drive, use machinery, or do anything that needs mental alertness until you know how this  medicine affects you. Do not stand or sit up quickly, especially if you are an older patient. This reduces the risk of dizzy or fainting spells. Alcohol can increase possible drowsiness and dizziness. Avoid alcoholic drinks. Your mouth may get dry. Chewing sugarless gum or sucking hard candy, and drinking plenty of water may help. Contact your doctor if the problem does not go away or is severe. Drinking plenty of water can also help prevent dehydration that can occur with diarrhea. Elderly patients may have a more variable response to the effects of this medicine, and are more susceptible to the effects of dehydration. What side effects may I notice from receiving this medicine? Side effects that you should report to your doctor or health care professional as soon as possible: -allergic reactions like skin rash, itching or hives, swelling of the face, lips, or tongue -bloated, swollen feeling in your abdomen -blurred vision -loss of appetite -stomach pain Side effects that usually do not require medical attention (report to your doctor or health care professional if they continue or are bothersome): -constipation -drowsiness or dizziness -dry mouth -nausea, vomiting This list may not describe all  possible side effects. Call your doctor for medical advice about side effects. You may report side effects to FDA at 1-800-FDA-1088. Where should I keep my medicine? Keep out of the reach of children. Store at room temperature between 15 and 25 degrees C (59 and 77 degrees F). Keep container tightly closed. Throw away any unused medicine after the expiration date. NOTE: This sheet is a summary. It may not cover all possible information. If you have questions about this medicine, talk to your doctor, pharmacist, or health care provider.  2014, Elsevier/Gold Standard. (2008-01-28 16:02:13)

## 2013-11-18 ENCOUNTER — Encounter (HOSPITAL_COMMUNITY): Payer: Self-pay | Admitting: Pharmacist

## 2013-11-25 NOTE — Patient Instructions (Addendum)
   Your procedure is scheduled on:  Tuesday, April 28  [facility] at: 2 PM Pick up the phone at the desk and dial [phone] and inform us of your arrival.  Please call this number if you have any problems the morning of surgery: [phone]  Remember: Do not eat food after midnight: Monday Do not drink clear liquids after: 11 AM Tuesday, day of surgery Take these medicines the morning of surgery with a SIP OF WATER:  None  Do not wear jewelry, make-up, or FINGER nail polish No metal in your hair or on your body. Do not wear lotions, powders, perfumes.  You may wear deodorant.  Do not bring valuables to the hospital. Contacts, dentures or bridgework may not be worn into surgery.    Patients discharged on the day of surgery will not be allowed to drive home.  Ride to be arranged prior to surgery.

## 2013-11-26 ENCOUNTER — Encounter (HOSPITAL_COMMUNITY): Payer: Self-pay

## 2013-11-26 ENCOUNTER — Encounter (HOSPITAL_COMMUNITY)
Admission: RE | Admit: 2013-11-26 | Discharge: 2013-11-26 | Disposition: A | Discharge status: Home / Self Care | Payer: Medicaid Other | Source: Ambulatory Visit | Attending: Obstetrics & Gynecology | Admitting: Obstetrics & Gynecology

## 2013-11-26 DIAGNOSIS — Z01812 Encounter for preprocedural laboratory examination: Secondary | ICD-10-CM | POA: Insufficient documentation

## 2013-11-26 LAB — CBC
HCT: 40.5 % (ref 36.0–46.0)
HEMOGLOBIN: 13.5 g/dL (ref 12.0–15.0)
MCH: 30 pg (ref 26.0–34.0)
MCHC: 33.3 g/dL (ref 30.0–36.0)
MCV: 90 fL (ref 78.0–100.0)
Platelets: 158 10*3/uL (ref 150–400)
RBC: 4.5 MIL/uL (ref 3.87–5.11)
RDW: 11.8 % (ref 11.5–15.5)
WBC: 4 10*3/uL (ref 4.0–10.5)

## 2013-12-02 ENCOUNTER — Encounter (HOSPITAL_COMMUNITY): Payer: Medicaid Other | Admitting: Anesthesiology

## 2013-12-02 ENCOUNTER — Ambulatory Visit (HOSPITAL_COMMUNITY): Payer: Medicaid Other | Admitting: Anesthesiology

## 2013-12-02 ENCOUNTER — Encounter (HOSPITAL_COMMUNITY): Payer: Self-pay | Admitting: Anesthesiology

## 2013-12-02 ENCOUNTER — Ambulatory Visit (HOSPITAL_COMMUNITY)
Admission: RE | Admit: 2013-12-02 | Discharge: 2013-12-02 | Disposition: A | Discharge status: Home / Self Care | Payer: Medicaid Other | Source: Ambulatory Visit | Attending: Obstetrics & Gynecology | Admitting: Obstetrics & Gynecology

## 2013-12-02 ENCOUNTER — Encounter (HOSPITAL_COMMUNITY)
Admission: RE | Disposition: A | Discharge status: Home / Self Care | Payer: Self-pay | Source: Ambulatory Visit | Attending: Obstetrics & Gynecology

## 2013-12-02 DIAGNOSIS — Z302 Encounter for sterilization: Secondary | ICD-10-CM | POA: Diagnosis not present

## 2013-12-02 DIAGNOSIS — F3289 Other specified depressive episodes: Secondary | ICD-10-CM | POA: Insufficient documentation

## 2013-12-02 DIAGNOSIS — D649 Anemia, unspecified: Secondary | ICD-10-CM | POA: Diagnosis not present

## 2013-12-02 DIAGNOSIS — Z8614 Personal history of Methicillin resistant Staphylococcus aureus infection: Secondary | ICD-10-CM | POA: Diagnosis not present

## 2013-12-02 DIAGNOSIS — F329 Major depressive disorder, single episode, unspecified: Secondary | ICD-10-CM | POA: Diagnosis not present

## 2013-12-02 HISTORY — PX: LAPAROSCOPIC TUBAL LIGATION: SHX1937

## 2013-12-02 LAB — PREGNANCY, URINE: PREG TEST UR: NEGATIVE

## 2013-12-02 SURGERY — LIGATION, FALLOPIAN TUBE, LAPAROSCOPIC
Anesthesia: General | Site: Abdomen | Laterality: Bilateral

## 2013-12-02 MED ORDER — NEOSTIGMINE METHYLSULFATE 1 MG/ML IJ SOLN
INTRAMUSCULAR | Status: DC | PRN
  Administered 2013-12-02: 3 mg via INTRAVENOUS

## 2013-12-02 MED ORDER — ROCURONIUM BROMIDE 100 MG/10ML IV SOLN
INTRAVENOUS | Status: AC
  Filled 2013-12-02: qty 1

## 2013-12-02 MED ORDER — FENTANYL CITRATE 0.05 MG/ML IJ SOLN
INTRAMUSCULAR | Status: AC
  Filled 2013-12-02: qty 5

## 2013-12-02 MED ORDER — GLYCOPYRROLATE 0.2 MG/ML IJ SOLN
INTRAMUSCULAR | Status: AC
Start: 2013-12-02 — End: 2013-12-02
  Filled 2013-12-02: qty 3

## 2013-12-02 MED ORDER — ROCURONIUM BROMIDE 100 MG/10ML IV SOLN
INTRAVENOUS | Status: DC | PRN
  Administered 2013-12-02: 30 mg via INTRAVENOUS

## 2013-12-02 MED ORDER — MEPERIDINE HCL 25 MG/ML IJ SOLN: 6.2500 mg | INTRAMUSCULAR | Status: DC | PRN

## 2013-12-02 MED ORDER — MIDAZOLAM HCL 2 MG/2ML IJ SOLN
INTRAMUSCULAR | Status: DC | PRN
  Administered 2013-12-02: 2 mg via INTRAVENOUS

## 2013-12-02 MED ORDER — METOCLOPRAMIDE HCL 5 MG/ML IJ SOLN: 10.0000 mg | Freq: Once | INTRAMUSCULAR | Status: DC | PRN

## 2013-12-02 MED ORDER — DEXAMETHASONE SODIUM PHOSPHATE 10 MG/ML IJ SOLN
INTRAMUSCULAR | Status: DC | PRN
  Administered 2013-12-02: 10 mg via INTRAVENOUS

## 2013-12-02 MED ORDER — LACTATED RINGERS IV SOLN
INTRAVENOUS | Status: DC
  Administered 2013-12-02 (×3): via INTRAVENOUS

## 2013-12-02 MED ORDER — PROPOFOL 10 MG/ML IV BOLUS
INTRAVENOUS | Status: DC | PRN
  Administered 2013-12-02: 180 mg via INTRAVENOUS

## 2013-12-02 MED ORDER — DEXAMETHASONE SODIUM PHOSPHATE 10 MG/ML IJ SOLN
INTRAMUSCULAR | Status: AC
  Filled 2013-12-02: qty 1

## 2013-12-02 MED ORDER — ONDANSETRON HCL 4 MG/2ML IJ SOLN
INTRAMUSCULAR | Status: AC
  Filled 2013-12-02: qty 2

## 2013-12-02 MED ORDER — NEOSTIGMINE METHYLSULFATE 1 MG/ML IJ SOLN
INTRAMUSCULAR | Status: AC
  Filled 2013-12-02: qty 1

## 2013-12-02 MED ORDER — KETOROLAC TROMETHAMINE 30 MG/ML IJ SOLN
INTRAMUSCULAR | Status: DC | PRN
  Administered 2013-12-02: 30 mg via INTRAVENOUS

## 2013-12-02 MED ORDER — FENTANYL CITRATE 0.05 MG/ML IJ SOLN
25.0000 ug | INTRAMUSCULAR | Status: DC | PRN
  Administered 2013-12-02: 50 ug via INTRAVENOUS

## 2013-12-02 MED ORDER — OXYCODONE-ACETAMINOPHEN 5-325 MG PO TABS: 1.0000 | ORAL_TABLET | Freq: Four times a day (QID) | ORAL | Status: DC | PRN

## 2013-12-02 MED ORDER — GLYCOPYRROLATE 0.2 MG/ML IJ SOLN
INTRAMUSCULAR | Status: DC | PRN
  Administered 2013-12-02: 0.4 mg via INTRAVENOUS

## 2013-12-02 MED ORDER — BUPIVACAINE HCL (PF) 0.5 % IJ SOLN
INTRAMUSCULAR | Status: AC
Start: 2013-12-02 — End: 2013-12-02
  Filled 2013-12-02: qty 30

## 2013-12-02 MED ORDER — LACTATED RINGERS IV SOLN
INTRAVENOUS | Status: DC
  Administered 2013-12-02: 14:00:00 via INTRAVENOUS

## 2013-12-02 MED ORDER — ONDANSETRON HCL 4 MG/2ML IJ SOLN
INTRAMUSCULAR | Status: DC | PRN
  Administered 2013-12-02: 4 mg via INTRAVENOUS

## 2013-12-02 MED ORDER — BUPIVACAINE HCL (PF) 0.25 % IJ SOLN
INTRAMUSCULAR | Status: DC | PRN
  Administered 2013-12-02: 7 mL

## 2013-12-02 MED ORDER — LIDOCAINE HCL (CARDIAC) 20 MG/ML IV SOLN
INTRAVENOUS | Status: DC | PRN
  Administered 2013-12-02: 50 mg via INTRAVENOUS

## 2013-12-02 MED ORDER — MIDAZOLAM HCL 2 MG/2ML IJ SOLN
INTRAMUSCULAR | Status: AC
  Filled 2013-12-02: qty 2

## 2013-12-02 MED ORDER — PROPOFOL 10 MG/ML IV EMUL
INTRAVENOUS | Status: AC
  Filled 2013-12-02: qty 20

## 2013-12-02 MED ORDER — BUPIVACAINE HCL (PF) 0.25 % IJ SOLN
INTRAMUSCULAR | Status: AC
  Filled 2013-12-02: qty 30

## 2013-12-02 MED ORDER — LIDOCAINE HCL (CARDIAC) 20 MG/ML IV SOLN
INTRAVENOUS | Status: AC
Start: 2013-12-02 — End: 2013-12-02
  Filled 2013-12-02: qty 5

## 2013-12-02 MED ORDER — KETOROLAC TROMETHAMINE 30 MG/ML IJ SOLN
INTRAMUSCULAR | Status: AC
  Filled 2013-12-02: qty 1

## 2013-12-02 MED ORDER — FENTANYL CITRATE 0.05 MG/ML IJ SOLN
INTRAMUSCULAR | Status: DC | PRN
  Administered 2013-12-02: 100 ug via INTRAVENOUS
  Administered 2013-12-02 (×2): 50 ug via INTRAVENOUS

## 2013-12-02 MED ORDER — FENTANYL CITRATE 0.05 MG/ML IJ SOLN
INTRAMUSCULAR | Status: AC
  Administered 2013-12-02: 50 ug via INTRAVENOUS
  Filled 2013-12-02: qty 2

## 2013-12-02 SURGICAL SUPPLY — 21 items
BENZOIN TINCTURE PRP APPL 2/3 (GAUZE/BANDAGES/DRESSINGS) IMPLANT
CATH ROBINSON RED A/P 16FR (CATHETERS) ×3 IMPLANT
CHLORAPREP W/TINT 26ML (MISCELLANEOUS) ×3 IMPLANT
CLIP FILSHIE TUBAL LIGA STRL (Clip) ×3 IMPLANT
CLOTH BEACON ORANGE TIMEOUT ST (SAFETY) ×3 IMPLANT
DERMABOND ADVANCED (GAUZE/BANDAGES/DRESSINGS) ×2
DERMABOND ADVANCED .7 DNX12 (GAUZE/BANDAGES/DRESSINGS) ×1 IMPLANT
GLOVE BIO SURGEON STRL SZ 6.5 (GLOVE) ×2 IMPLANT
GLOVE BIO SURGEONS STRL SZ 6.5 (GLOVE) ×1
GLOVE BIOGEL PI IND STRL 7.0 (GLOVE) ×1 IMPLANT
GLOVE BIOGEL PI INDICATOR 7.0 (GLOVE) ×2
GOWN STRL REUS W/TWL LRG LVL3 (GOWN DISPOSABLE) ×6 IMPLANT
NEEDLE INSUFFLATION 120MM (ENDOMECHANICALS) ×3 IMPLANT
PACK LAPAROSCOPY BASIN (CUSTOM PROCEDURE TRAY) ×3 IMPLANT
SET IRRIG TUBING LAPAROSCOPIC (IRRIGATION / IRRIGATOR) IMPLANT
SUT VICRYL 0 UR6 27IN ABS (SUTURE) ×3 IMPLANT
SUT VICRYL 4-0 PS2 18IN ABS (SUTURE) ×3 IMPLANT
TOWEL OR 17X24 6PK STRL BLUE (TOWEL DISPOSABLE) ×6 IMPLANT
TROCAR XCEL DIL TIP R 11M (ENDOMECHANICALS) ×3 IMPLANT
WARMER LAPAROSCOPE (MISCELLANEOUS) ×3 IMPLANT
WATER STERILE IRR 1000ML POUR (IV SOLUTION) ×3 IMPLANT

## 2013-12-02 NOTE — Anesthesia Postprocedure Evaluation (Signed)
  Anesthesia Post-op Note  Patient: [name]  Procedure(s) Performed: Procedure(s) with comments: LAPAROSCOPIC TUBAL LIGATION WITH FILSCHE CLIPS (Bilateral) - FILSCHE CLIPS  Patient Location: PACU  Anesthesia Type:General  Level of Consciousness: awake, alert  and oriented  Airway and Oxygen Therapy: Patient Spontanous Breathing  Post-op Pain: none  Post-op Assessment: Post-op Vital signs reviewed, Patient's Cardiovascular Status Stable, Respiratory Function Stable, Patent Airway, No signs of Nausea or vomiting and Pain level controlled  Post-op Vital Signs: Reviewed and stable  Last Vitals:  Filed Vitals:   12/02/13 1715  BP: 117/65  Pulse: 63  Temp: 36.8 C  Resp: 17    Complications: No apparent anesthesia complications

## 2013-12-02 NOTE — Op Note (Signed)
Procedure: Laparoscopic bilateral tubal ligation with Filshie clips Preoperative diagnosis: Desires sterilization Postoperative diagnosis: Same Surgeon: Dr. Scheryl DarterJames Keona Sheffler Anesthesia: Gen. endotracheal Blood loss: Negligible Specimens: None Complications: None Drains: None Counts: Correct  Patient gave written consent for laparoscopic bilateral tubal ligation sterilization. Patient identification was confirmed and she was brought to the OR and general anesthesia was induced. His placed in dorsal lithotomy position. Exam revealed normal-size uterus. Perineum vagina and abdomen sterilely prepped and draped. Bladder was drained with red rubber catheter. A Hulka tenaculum was placed on the cervix. #11 blade was used to make a transverse infraumbilical incision about 1 1/2 cm across. Abdominal wall was elevated Veress needle was placed. CO2 was insufflated. The needle was removed and an 11 mm trocar was placed. Laparoscope was inserted with video camera and use. Panoramic view of the pelvis was obtained and uterus fallopian tubes and ovaries appeared normal. The Filshie clip was applied to the right fallopian tube and positioning was seen. Same was seen on the left. All instruments removed and CO2 was released. The fascia was closed with a single interrupted suture of 0 Vicryl. Skin was closed with interrupted subcuticular sutures with 4-0 Vicryl. Sterile dressing was applied. Patient tolerated the procedure well and was brought in stable condition to PACU.  Dr. Scheryl DarterJames Colter Magowan December 02 2013  4:12 PM

## 2013-12-02 NOTE — Transfer of Care (Signed)
Immediate Anesthesia Transfer of Care Note  Patient: [name]  Procedure(s) Performed: Procedure(s) with comments: LAPAROSCOPIC TUBAL LIGATION WITH FILSCHE CLIPS (Bilateral) - FILSCHE CLIPS  Patient Location: PACU  Anesthesia Type:General  Level of Consciousness: awake  Airway & Oxygen Therapy: Patient Spontanous Breathing  Post-op Assessment: Report given to PACU RN  Post vital signs: stable  Filed Vitals:   12/02/13 1346  BP: 119/74  Pulse: 82  Temp: 36.8 C  Resp: 20    Complications: No apparent anesthesia complications

## 2013-12-02 NOTE — Discharge Instructions (Addendum)
°  Laparoscopic Tubal Ligation Laparoscopic tubal ligation is a procedure that closes the fallopian tubes at a time other than right after childbirth. By closing the fallopian tubes, the eggs that are released from the ovaries cannot enter the uterus and sperm cannot reach the egg. Tubal ligation is also known as getting your "tubes tied." Tubal ligation is done so you will not be able to get pregnant or have a baby.  Although this procedure may be reversed, it should be considered permanent and irreversible. If you want to have future pregnancies, you should not have this procedure.  LET YOUR CAREGIVER KNOW ABOUT:  Allergies to food or medicine.  Medicines taken, including vitamins, herbs, eyedrops, over-the-counter medicines, and creams.  Use of steroids (by mouth or creams).  Previous problems with numbing medicines.  History of bleeding problems or blood clots.  Any recent colds or infections.  Previous surgery.  Other health problems, including diabetes and kidney problems.  Possibility of pregnancy, if this applies.  Any past pregnancies. RISKS AND COMPLICATIONS   Infection.  Bleeding.  Injury to surrounding organs.  Anesthetic side effects.  Failure of the procedure.  Ectopic pregnancy.  Future regret about having the procedure done. BEFORE THE PROCEDURE  Do not take aspirin or blood thinners a week before the procedure or as directed. This can cause bleeding.  Do not eat or drink anything 6 to 8 hours before the procedure. PROCEDURE   You may be given a medicine to help you relax (sedative) before the procedure. You will be given a medicine to make you sleep (general anesthetic) during the procedure.  A tube will be put down your throat to help your breath while under general anesthesia.  Small cuts (incisions) are made in the lower abdominal area and near the belly button.  Your abdominal area will be inflated with a safe gas (carbon dioxide). This helps  give the surgeon room to operate, visualize, and helps the surgeon avoid other organs.  A thin, lighted tube (laparoscope) with a camera attached is inserted into your abdomen through one of the incisions near the belly button. Other small instruments are also inserted through the other abdominal incision.  The fallopian tubes are located and are either blocked with a ring, clip, or are burned (cauterized).  After the fallopian tubes are blocked, the gas is released from the abdomen.  The incisions will be closed with stitches (sutures), and a bandage may be placed over the incisions. AFTER THE PROCEDURE  You will rest in a recovery room for 1 4 hours until you are stable and doing well.  You will also have some mild abdominal discomfort for 3 7 days. You will be given pain medicine to ease any discomfort.  As long as there are no problems, you may be allowed to go home. Someone will need to drive you home and be with you for at least 24 hours once home.  You may have some mild discomfort in the throat. This is from the tube placed in your throat while you were sleeping.  You may experience discomfort in the shoulder area from some trapped air between the liver and diaphragm. This sensation is normal and will slowly go away on its own.  No Ibuprofen containing products (ie Advil, Aleve, Motrin, etc) until after 10 pm tonight.  Document Released: 10/30/2000 Document Revised: 01/23/2012 Document Reviewed: 11/04/2011 [facility] Patient Information 2014 [place].

## 2013-12-02 NOTE — Transfer of Care (Deleted)
Immediate Anesthesia Transfer of Care Note  Patient: [name]  Procedure(s) Performed: Procedure(s) with comments: LAPAROSCOPIC TUBAL LIGATION WITH FILSCHE CLIPS (Bilateral) - FILSCHE CLIPS  Patient Location: PACU  Anesthesia Type:General  Level of Consciousness: awake, alert  and oriented  Airway & Oxygen Therapy: Patient Spontanous Breathing and Patient connected to nasal cannula oxygen  Post-op Assessment: Report given to PACU RN, Post -op Vital signs reviewed and stable and Patient moving all extremities X 4  Post vital signs: Reviewed and stable  Complications: No apparent anesthesia complications

## 2013-12-02 NOTE — Anesthesia Preprocedure Evaluation (Signed)
Anesthesia Evaluation  Patient identified by MRN, date of birth, ID band Patient awake    Reviewed: Allergy & Precautions, H&P , NPO status , Patient's Chart, lab work & pertinent test results  Airway Mallampati: II TM Distance: >3 FB Neck ROM: Full    Dental no notable dental hx. (+) Missing, Teeth Intact   Pulmonary neg pulmonary ROS,  breath sounds clear to auscultation  Pulmonary exam normal       Cardiovascular negative cardio ROS  Rhythm:Regular Rate:Normal     Neuro/Psych PSYCHIATRIC DISORDERS Depression negative neurological ROS     GI/Hepatic negative GI ROS, Neg liver ROS,   Endo/Other  negative endocrine ROS  Renal/GU negative Renal ROS  negative genitourinary   Musculoskeletal negative musculoskeletal ROS (+)   Abdominal Normal abdominal exam  (+)   Peds  Hematology  (+) anemia , Hx/o Thrombocytopenia   Anesthesia Other Findings   Reproductive/Obstetrics                           Anesthesia Physical Anesthesia Plan  ASA: II  Anesthesia Plan: General   Post-op Pain Management:    Induction: Intravenous  Airway Management Planned: Oral ETT  Additional Equipment:   Intra-op Plan:   Post-operative Plan: Extubation in OR  Informed Consent: I have reviewed the patients History and Physical, chart, labs and discussed the procedure including the risks, benefits and alternatives for the proposed anesthesia with the patient or authorized representative who has indicated his/her understanding and acceptance.   Dental advisory given  Plan Discussed with: CRNA, Anesthesiologist and Surgeon  Anesthesia Plan Comments:         Anesthesia Quick Evaluation

## 2013-12-02 NOTE — H&P (Signed)
[name] is an 28 y.o. female. [id number] No LMP recorded. Patient has had an injection. Scheduled for laparoscopic BTL today. 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 The procedure and the risk of anesthesia, bleeding, infection, bowel and bladder injury, failure (1/200) and ectopic pregnancy were discussed and her questions were answered.    Pertinent Gynecological History: Menses:   Bleeding:   Contraception: Depo-Provera injections DES exposure: denies Blood transfusions: none Sexually transmitted diseases: trichomonas Previous GYN Procedures: LEEP  Last mammogram: n/a Date:  Last pap: normal Date: 10/2013   Menstrual History:  No LMP recorded. Patient has had an injection.    Past Medical History  Diagnosis Date  . Abnormal Pap smear   . Anemia   . Mental disorder     hx of pp depression  . STD (female)     chlamydia and trich  . Hx MRSA infection     History only - 2 Negative MRSA swabs in 2012  . SVD (spontaneous vaginal delivery)     x 5  . Depression     no meds     Past Surgical History  Procedure Laterality Date  . Cervical biopsy  w/ loop electrode excision    . Wisdom tooth extraction      Family History  Problem Relation Age of Onset  . Cancer Paternal Grandmother   . Hypertension Maternal Grandmother   . Heart disease Maternal Grandmother   . Diabetes Maternal Grandmother   . Stroke Maternal Grandmother   . Cancer Maternal Grandfather   . Hypertension Mother     Social History:  reports that she has never smoked. She has never used smokeless tobacco. She reports that she uses illicit drugs (Marijuana). She reports that she does not drink alcohol.  Allergies: No Known Allergies  Prescriptions prior to admission  Medication Sig Dispense Refill  . loperamide (IMODIUM) 2 MG capsule Take 1 capsule (2 mg total) by mouth 4 (four) times daily as needed for diarrhea or loose stools.  12 capsule  0  . medroxyPROGESTERone (DEPO-PROVERA) 150 MG/ML  injection Inject 150 mg into the muscle every 3 (three) months.      . metroNIDAZOLE (FLAGYL) 500 MG tablet Take 4 tablets (2,000 mg total) by mouth once.  4 tablet  0  . ondansetron (ZOFRAN) 4 MG tablet Take 1 tablet (4 mg total) by mouth every 6 (six) hours as needed for nausea or vomiting.  12 tablet  0    ROS  Blood pressure 119/74, pulse 82, temperature 98.2 F (36.8 C), temperature source Oral, resp. rate 20, SpO2 100.00%. Physical Exam Chest clear Heart RRR Abdomen not tender Pelvic deferred  Last pap nl Results for orders placed during the hospital encounter of 12/02/13 (from the past 24 hour(s))  PREGNANCY, URINE     Status: None   Collection Time    12/02/13  1:45 PM      Result Value Ref Range   Preg Test, Ur NEGATIVE  NEGATIVE   CBC    Component Value Date/Time   WBC 4.0 11/26/2013 1035   RBC 4.50 11/26/2013 1035   HGB 13.5 11/26/2013 1035   HCT 40.5 11/26/2013 1035   PLT 158 11/26/2013 1035   MCV 90.0 11/26/2013 1035   MCH 30.0 11/26/2013 1035   MCHC 33.3 11/26/2013 1035   RDW 11.8 11/26/2013 1035   LYMPHSABS 1.5 11/13/2013 2020   MONOABS 0.2 11/13/2013 2020   EOSABS 0.1 11/13/2013 2020   BASOSABS 0.0 11/13/2013 2020  No results found.  Assessment/Plan: LBTL today.  Adam PhenixJames G Evelio Rueda 12/02/2013, 2:30 PM

## 2013-12-03 ENCOUNTER — Encounter (HOSPITAL_COMMUNITY): Payer: Self-pay | Admitting: Obstetrics & Gynecology

## 2014-01-09 ENCOUNTER — Ambulatory Visit: Payer: Medicaid Other

## 2014-04-04 ENCOUNTER — Encounter (HOSPITAL_COMMUNITY): Payer: Self-pay | Admitting: Emergency Medicine

## 2014-04-04 ENCOUNTER — Emergency Department (HOSPITAL_COMMUNITY)
Admission: EM | Admit: 2014-04-04 | Discharge: 2014-04-05 | Disposition: A | Discharge status: Other Acute Inpatient Hospital | Payer: Medicaid Other | Attending: Dermatology | Admitting: Dermatology

## 2014-04-04 DIAGNOSIS — Z8614 Personal history of Methicillin resistant Staphylococcus aureus infection: Secondary | ICD-10-CM | POA: Diagnosis not present

## 2014-04-04 DIAGNOSIS — F329 Major depressive disorder, single episode, unspecified: Secondary | ICD-10-CM | POA: Insufficient documentation

## 2014-04-04 DIAGNOSIS — F411 Generalized anxiety disorder: Secondary | ICD-10-CM | POA: Insufficient documentation

## 2014-04-04 DIAGNOSIS — Z862 Personal history of diseases of the blood and blood-forming organs and certain disorders involving the immune mechanism: Secondary | ICD-10-CM | POA: Insufficient documentation

## 2014-04-04 DIAGNOSIS — R45850 Homicidal ideations: Secondary | ICD-10-CM

## 2014-04-04 DIAGNOSIS — Z8742 Personal history of other diseases of the female genital tract: Secondary | ICD-10-CM | POA: Insufficient documentation

## 2014-04-04 DIAGNOSIS — F3289 Other specified depressive episodes: Secondary | ICD-10-CM | POA: Diagnosis not present

## 2014-04-04 LAB — COMPREHENSIVE METABOLIC PANEL
ALT: 22 U/L (ref 0–35)
AST: 20 U/L (ref 0–37)
Albumin: 4.3 g/dL (ref 3.5–5.2)
Alkaline Phosphatase: 65 U/L (ref 39–117)
Anion gap: 14 (ref 5–15)
BUN: 10 mg/dL (ref 6–23)
CHLORIDE: 105 meq/L (ref 96–112)
CO2: 24 meq/L (ref 19–32)
CREATININE: 1.05 mg/dL (ref 0.50–1.10)
Calcium: 9.6 mg/dL (ref 8.4–10.5)
GFR calc Af Amer: 83 mL/min — ABNORMAL LOW (ref >90)
GFR, EST NON AFRICAN AMERICAN: 72 mL/min — AB (ref >90)
GLUCOSE: 96 mg/dL (ref 70–99)
Potassium: 3.3 mEq/L — ABNORMAL LOW (ref 3.7–5.3)
SODIUM: 143 meq/L (ref 137–147)
Total Bilirubin: 1.4 mg/dL — ABNORMAL HIGH (ref 0.3–1.2)
Total Protein: 8 g/dL (ref 6.0–8.3)

## 2014-04-04 LAB — CBC
HCT: 38.7 % (ref 36.0–46.0)
HEMOGLOBIN: 13 g/dL (ref 12.0–15.0)
MCH: 30.1 pg (ref 26.0–34.0)
MCHC: 33.6 g/dL (ref 30.0–36.0)
MCV: 89.6 fL (ref 78.0–100.0)
Platelets: 177 10*3/uL (ref 150–400)
RBC: 4.32 MIL/uL (ref 3.87–5.11)
RDW: 11.6 % (ref 11.5–15.5)
WBC: 4.1 10*3/uL (ref 4.0–10.5)

## 2014-04-04 LAB — ETHANOL: Alcohol, Ethyl (B): <11 mg/dL (ref 0–11)

## 2014-04-04 LAB — RAPID URINE DRUG SCREEN, HOSP PERFORMED
AMPHETAMINES: NOT DETECTED
BARBITURATES: NOT DETECTED
BENZODIAZEPINES: NOT DETECTED
Cocaine: NOT DETECTED
Opiates: NOT DETECTED
Tetrahydrocannabinol: POSITIVE — AB

## 2014-04-04 LAB — SALICYLATE LEVEL: Salicylate Lvl: <2 mg/dL — ABNORMAL LOW (ref 2.8–20.0)

## 2014-04-04 LAB — ACETAMINOPHEN LEVEL: Acetaminophen (Tylenol), Serum: <15 ug/mL (ref 10–30)

## 2014-04-04 MED ORDER — ACETAMINOPHEN 325 MG PO TABS: 650.0000 mg | ORAL_TABLET | ORAL | Status: DC | PRN

## 2014-04-04 MED ORDER — TRAZODONE HCL 50 MG PO TABS
50.0000 mg | ORAL_TABLET | Freq: Every evening | ORAL | Status: DC | PRN
  Administered 2014-04-04: 50 mg via ORAL
  Filled 2014-04-04: qty 1

## 2014-04-04 MED ORDER — IBUPROFEN 200 MG PO TABS: 600.0000 mg | ORAL_TABLET | Freq: Three times a day (TID) | ORAL | Status: DC | PRN

## 2014-04-04 MED ORDER — LORAZEPAM 1 MG PO TABS
1.0000 mg | ORAL_TABLET | Freq: Three times a day (TID) | ORAL | Status: DC | PRN
  Administered 2014-04-04: 1 mg via ORAL
  Filled 2014-04-04: qty 1

## 2014-04-04 MED ORDER — POTASSIUM CHLORIDE CRYS ER 20 MEQ PO TBCR
40.0000 meq | EXTENDED_RELEASE_TABLET | Freq: Once | ORAL | Status: AC
  Administered 2014-04-04: 40 meq via ORAL
  Filled 2014-04-04: qty 2

## 2014-04-04 NOTE — ED Notes (Signed)
Bed: WA25 Expected date:  Expected time:  Means of arrival:  Comments: Hold for triage 1 

## 2014-04-04 NOTE — ED Provider Notes (Signed)
CSN: [id number]     Arrival date & time 04/04/14  1355 History   First MD Initiated Contact with Patient 04/04/14 1451     Chief Complaint  Patient presents with  . Homicidal     (Consider location/radiation/quality/duration/timing/severity/associated sxs/prior Treatment) The history is provided by the patient.  pt with hx depression, c/o feeling overwhelmed w life in general, stressors related to taking care of multiple children, including her own and step children.  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 States is having thoughts of 'lining up all of her kids, and killing them'.  Denies any previous harming of children, states she takes good care of her kids. States finds it stressful that the 'baby mommy' of her step kids lives just behind them, which she also finds stressful. Denies etoh or drug abuse. Denies any current prescription meds use. Occasional otc use. Denies overuse or od of meds.       Past Medical History  Diagnosis Date  . Abnormal Pap smear   . Anemia   . Mental disorder     hx of pp depression  . STD (female)     chlamydia and trich  . Hx MRSA infection     History only - 2 Negative MRSA swabs in 2012  . SVD (spontaneous vaginal delivery)     x 5  . Depression     no meds    Past Surgical History  Procedure Laterality Date  . Cervical biopsy  w/ loop electrode excision    . Wisdom tooth extraction    . Laparoscopic tubal ligation Bilateral 12/02/2013    Procedure: LAPAROSCOPIC TUBAL LIGATION WITH FILSCHE CLIPS;  Surgeon: Adam Phenix, MD;  Location: [facility];  Service: Gynecology;  Laterality: Bilateral;  FILSCHE CLIPS   Family History  Problem Relation Age of Onset  . Cancer Paternal Grandmother   . Hypertension Maternal Grandmother   . Heart disease Maternal Grandmother   . Diabetes Maternal Grandmother   . Stroke Maternal Grandmother   . Cancer Maternal Grandfather   . Hypertension Mother    History  Substance  Use Topics  . Smoking status: Never Smoker   . Smokeless tobacco: Never Used  . Alcohol Use: No   OB History   Grav Para Term Preterm Abortions TAB SAB Ect Mult Living   0 0 0 0 0 1 5     Review of Systems  Constitutional: Negative for fever and chills.  HENT: Negative for sore throat.   Eyes: Negative for pain and visual disturbance.  Respiratory: Negative for shortness of breath.   Cardiovascular: Negative for chest pain.  Gastrointestinal: Negative for vomiting and abdominal pain.  Genitourinary: Negative for flank pain.  Musculoskeletal: Negative for back pain and neck pain.  Skin: Negative for rash.  Neurological: Negative for weakness, numbness and headaches.  Hematological: Does not bruise/bleed easily.  Psychiatric/Behavioral: Positive for dysphoric mood. Negative for suicidal ideas.      Allergies  Review of patient's allergies indicates no known allergies.  Home Medications   Prior to Admission medications   Medication Sig Start Date End Date Taking? Authorizing Provider  aspirin-acetaminophen-caffeine (EXCEDRIN EXTRA STRENGTH) [id number] MG per tablet Take 2 tablets by mouth every 6 (six) hours as needed for headache.   Yes Historical Provider, MD   BP 113/87  Pulse 90  Temp(Src) 98 F (36.7 C) (Oral)  SpO2 100%  LMP 04/04/2014 Physical Exam  Nursing note and  vitals reviewed. Constitutional: She is oriented to person, place, and time. She appears well-developed and well-nourished. No distress.  HENT:  Mouth/Throat: Oropharynx is clear and moist.  Eyes: Conjunctivae are normal. No scleral icterus.  Neck: Neck supple. No tracheal deviation present.  Cardiovascular: Normal rate.   Pulmonary/Chest: Effort normal and breath sounds normal. No respiratory distress. She exhibits no tenderness.  Abdominal: Soft. Normal appearance and bowel sounds are normal. She exhibits no distension and no mass. There is no tenderness. There is no rebound and no guarding.   Musculoskeletal: She exhibits no edema and no tenderness.  Neurological: She is alert and oriented to person, place, and time.  Steady gait  Skin: Skin is warm and dry. No rash noted.  Psychiatric:  Appears anxious/stressed. +HI.     ED Course  Procedures (including critical care time) Labs Review  Results for orders placed during the hospital encounter of 04/04/14  ACETAMINOPHEN LEVEL      Result Value Ref Range   Acetaminophen (Tylenol), Serum <15.0  10 - 30 ug/mL  CBC      Result Value Ref Range   WBC 4.1  4.0 - 10.5 K/uL   RBC 4.32  3.87 - 5.11 MIL/uL   Hemoglobin 13.0  12.0 - 15.0 g/dL   HCT 69.6  29.5 - 28.4 %   MCV 89.6  78.0 - 100.0 fL   MCH 30.1  26.0 - 34.0 pg   MCHC 33.6  30.0 - 36.0 g/dL   RDW 13.2  44.0 - 10.2 %   Platelets 177  150 - 400 K/uL  COMPREHENSIVE METABOLIC PANEL      Result Value Ref Range   Sodium 143  137 - 147 mEq/L   Potassium 3.3 (*) 3.7 - 5.3 mEq/L   Chloride 105  96 - 112 mEq/L   CO2 24  19 - 32 mEq/L   Glucose, Bld 96  70 - 99 mg/dL   BUN 10  6 - 23 mg/dL   Creatinine, Ser 7.25  0.50 - 1.10 mg/dL   Calcium 9.6  8.4 - 36.6 mg/dL   Total Protein 8.0  6.0 - 8.3 g/dL   Albumin 4.3  3.5 - 5.2 g/dL   AST 20  0 - 37 U/L   ALT 22  0 - 35 U/L   Alkaline Phosphatase 65  39 - 117 U/L   Total Bilirubin 1.4 (*) 0.3 - 1.2 mg/dL   GFR calc non Af Amer 72 (*) >90 mL/min   GFR calc Af Amer 83 (*) >90 mL/min   Anion gap 14  5 - 15  ETHANOL      Result Value Ref Range   Alcohol, Ethyl (B) <11  0 - 11 mg/dL  SALICYLATE LEVEL      Result Value Ref Range   Salicylate Lvl <2.0 (*) 2.8 - 20.0 mg/dL  URINE RAPID DRUG SCREEN (HOSP PERFORMED)      Result Value Ref Range   Opiates NONE DETECTED  NONE DETECTED   Cocaine NONE DETECTED  NONE DETECTED   Benzodiazepines NONE DETECTED  NONE DETECTED   Amphetamines NONE DETECTED  NONE DETECTED   Tetrahydrocannabinol POSITIVE (*) NONE DETECTED   Barbiturates NONE DETECTED  NONE DETECTED      MDM    Labs.  Psych team consulted.  Pt moved to psych ED, anticipate will need inpatient psych tx, dispo per psych team.  Reviewed nursing notes and prior charts for additional history.   k mildly low, kcl po.  ivc papers re HI.     Suzi Roots, MD 04/04/14 [phone]

## 2014-04-04 NOTE — ED Notes (Signed)
On the phone 
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
## 2014-04-04 NOTE — ED Notes (Signed)
Snack/juice given; TTS into see

## 2014-04-04 NOTE — ED Notes (Signed)
Pt's cell phone and battery given to husband.
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
## 2014-04-04 NOTE — Progress Notes (Signed)
The following facilities have been contacted regarding bed availability for inptx on pt's behalf:  REFERRAL FAXED: [facility]- per [facility] beds available [facility]- per Florentina Addison can fax [facility]- per Leta Jungling have beds available [facility]- per Amy can fax [facility]- per Trey Paula can fax [facility]- per [facility]- per Aundra Millet can fax [facility]- per Tammy can fax for review  AT [facility]: [facility]- per Denna Haggard- per Teressa Lower- per Johna Roles- per Kandra Nicolas- per [facility]- per Sharee Pimple- per [facility]- per Buelah Manis- per Denny Peon [facility]- per Charolotte Eke- per Burnett Corrente- per [facility]- per [facility] Disposition [facility]
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
## 2014-04-04 NOTE — ED Notes (Signed)
Patient presents anxious, states that she feels overwhelmed and just feels that she really needs help. Continues to endorse the desire to kill children and herself, positive for [facility] of grandmother and friend who passed away; states that they are very positive and tell her to give the children back so that she can move on with her life. NAD

## 2014-04-04 NOTE — ED Notes (Signed)
Pt has blue stretch pants, white tank top, sandals, Security is giving 3 - $20.00 bills who she requested be given to husband.

## 2014-04-04 NOTE — ED Notes (Signed)
2 pt belonging bags containing clothing documented in previous note placed in locker 30.

## 2014-04-05 DIAGNOSIS — F323 Major depressive disorder, single episode, severe with psychotic features: Secondary | ICD-10-CM | POA: Insufficient documentation

## 2014-04-05 HISTORY — DX: Major depressive disorder, single episode, severe with psychotic features: F32.3

## 2014-04-05 NOTE — ED Notes (Signed)
Pehlam here to transport 

## 2014-04-05 NOTE — ED Notes (Signed)
No one available to take report at this time-will call back

## 2014-04-05 NOTE — ED Notes (Addendum)
Up to the desk on the phone-pt to be transferred to [facility] today.  Pt will notify family-contact information given

## 2014-04-05 NOTE — BH Assessment (Signed)
BHH Assessment Progress Note  Notified pt about bed available at [facility], educated about treatment process, and  pt agreed to sign in voluntarily. Pt will be transported by [employer] transportation.

## 2014-04-05 NOTE — BH Assessment (Signed)
04/05/2014  [name], [facility] at [facility] has stated that the Adult Unit is at capacity. This Clinical research associate has contacted the following facilities for placement:  Information Faxed; Patient under review: [facility] per [facility] per [facility] per [facility] per [facility] per Milinda Pointer  At Capacity: [facility] per [facility] per Earl Lites per [facility] per [facility] per [facility] per Melvenia Beam per [facility] per [facility] per [facility] per Vickey Huger per [facility] per [facility]   No Response: [facility] [place] A 2:07 AM

## 2014-04-05 NOTE — ED Notes (Signed)
Up to the bathroom 

## 2014-04-05 NOTE — ED Notes (Addendum)
Pt ambulatory w/o difficulty to [facility] w/ [place] driver, belongings given to driver.

## 2014-04-05 NOTE — ED Provider Notes (Signed)
[facility]  Vitals stable.  Labs reviewed.  Stable for transfer  Linwood Dibbles, MD 04/05/14 [phone]

## 2014-04-05 NOTE — BH Assessment (Signed)
BHH Assessment Progress Note   Update:  Received call from [facility] at [facility] [phone]) @ [phone] stating pt accepted there to Dr. Bridgette Habermann to bed S102A and could arrive there after 9 AM.  Number for nurse to nurse report is [phone].  Updated TTS and ED staff.  Casimer Lanius, MS, [facility] Licensed Professional Counselor Triage Specialist

## 2014-04-05 NOTE — ED Notes (Signed)
[facility] notified [facility] is in route

## 2014-04-05 NOTE — ED Notes (Signed)
TTS into see 

## 2014-04-05 NOTE — BH Assessment (Signed)
Received call from [facility] declining patient.  [name] A 04/05/2014 3:54 AM   

## 2014-06-08 ENCOUNTER — Encounter (HOSPITAL_COMMUNITY): Payer: Self-pay | Admitting: Emergency Medicine

## 2014-09-30 ENCOUNTER — Telehealth: Payer: Self-pay | Admitting: *Deleted

## 2014-09-30 ENCOUNTER — Encounter: Payer: Self-pay | Admitting: *Deleted

## 2014-09-30 ENCOUNTER — Ambulatory Visit: Payer: Medicaid Other | Admitting: Obstetrics and Gynecology

## 2014-09-30 NOTE — Telephone Encounter (Signed)
[name] missed a scheduled appointment for a yearly checkup.  Called [name] and unable to leave a message as I heard a message this is a nonworking number. Will send letter.

## 2014-11-18 ENCOUNTER — Other Ambulatory Visit: Payer: Medicaid Other | Admitting: *Deleted

## 2014-11-18 DIAGNOSIS — N390 Urinary tract infection, site not specified: Secondary | ICD-10-CM

## 2014-11-18 LAB — POCT URINALYSIS DIP (DEVICE)
Glucose, UA: NEGATIVE mg/dL
Ketones, ur: 15 mg/dL — AB
Nitrite: NEGATIVE
Specific Gravity, Urine: >=1.03 (ref 1.005–1.030)
UROBILINOGEN UA: 2 mg/dL — AB (ref 0.0–1.0)
pH: 6 (ref 5.0–8.0)

## 2014-11-18 MED ORDER — SULFAMETHOXAZOLE-TRIMETHOPRIM 800-160 MG PO TABS: 1.0000 | ORAL_TABLET | Freq: Two times a day (BID) | ORAL | Status: DC

## 2014-11-18 MED ORDER — PHENAZOPYRIDINE HCL 95 MG PO TABS
95.0000 mg | ORAL_TABLET | Freq: Three times a day (TID) | ORAL | Status: DC | PRN
Start: 2014-11-18 — End: 2015-04-13

## 2014-11-18 NOTE — Progress Notes (Signed)
Pt complains of urinary frequency/bladder pain for past 5 days.  Pt denies being pregnant. Urine obtained, results reviewed with Ivonne AndrewV. Smith CNM, new orders given.  Instructions to patient, pt verbalizes understanding.

## 2014-11-21 LAB — URINE CULTURE: Colony Count: >=100000

## 2014-12-23 ENCOUNTER — Ambulatory Visit: Payer: Medicaid Other | Admitting: Obstetrics & Gynecology

## 2015-04-03 ENCOUNTER — Emergency Department (HOSPITAL_COMMUNITY): Payer: Medicaid Other

## 2015-04-03 ENCOUNTER — Encounter (HOSPITAL_COMMUNITY): Payer: Self-pay | Admitting: Emergency Medicine

## 2015-04-03 ENCOUNTER — Emergency Department (HOSPITAL_COMMUNITY)
Admission: EM | Admit: 2015-04-03 | Discharge: 2015-04-03 | Disposition: A | Discharge status: Home / Self Care | Payer: Medicaid Other | Attending: Emergency Medicine | Admitting: Emergency Medicine

## 2015-04-03 DIAGNOSIS — Z8659 Personal history of other mental and behavioral disorders: Secondary | ICD-10-CM | POA: Diagnosis not present

## 2015-04-03 DIAGNOSIS — Z8619 Personal history of other infectious and parasitic diseases: Secondary | ICD-10-CM | POA: Diagnosis not present

## 2015-04-03 DIAGNOSIS — Z862 Personal history of diseases of the blood and blood-forming organs and certain disorders involving the immune mechanism: Secondary | ICD-10-CM | POA: Diagnosis not present

## 2015-04-03 DIAGNOSIS — S6991XA Unspecified injury of right wrist, hand and finger(s), initial encounter: Secondary | ICD-10-CM | POA: Diagnosis present

## 2015-04-03 DIAGNOSIS — Y9289 Other specified places as the place of occurrence of the external cause: Secondary | ICD-10-CM | POA: Diagnosis not present

## 2015-04-03 DIAGNOSIS — Y9389 Activity, other specified: Secondary | ICD-10-CM | POA: Insufficient documentation

## 2015-04-03 DIAGNOSIS — W230XXA Caught, crushed, jammed, or pinched between moving objects, initial encounter: Secondary | ICD-10-CM | POA: Insufficient documentation

## 2015-04-03 DIAGNOSIS — Y998 Other external cause status: Secondary | ICD-10-CM | POA: Diagnosis not present

## 2015-04-03 DIAGNOSIS — Z8614 Personal history of Methicillin resistant Staphylococcus aureus infection: Secondary | ICD-10-CM | POA: Diagnosis not present

## 2015-04-03 MED ORDER — NAPROXEN 375 MG PO TABS: 375.0000 mg | ORAL_TABLET | Freq: Two times a day (BID) | ORAL | Status: DC

## 2015-04-03 MED ORDER — OXYCODONE HCL 5 MG PO TABS: 2.5000 mg | ORAL_TABLET | ORAL | Status: DC | PRN

## 2015-04-03 MED ORDER — OXYCODONE-ACETAMINOPHEN 5-325 MG PO TABS
1.0000 | ORAL_TABLET | Freq: Once | ORAL | Status: AC
  Administered 2015-04-03: 1 via ORAL
  Filled 2015-04-03: qty 1

## 2015-04-03 NOTE — ED Provider Notes (Signed)
CSN: [id number]     Arrival date & time 04/03/15  0119 History   First MD Initiated Contact with Patient 04/03/15 [phone]     Chief Complaint  Patient presents with  . Finger Injury     (Consider location/radiation/quality/duration/timing/severity/associated sxs/prior Treatment) HPI   [name] This 29 year old female who presents the emergency department with chief complaint of right finger injury. Patient states that 2 nights ago her twins slammed her finger in a sliding door. She's had significant pain in the finger since that time. He has continued to swell. Patient was out for last night with her aren't helping her get her car towed. She states that her pain was so severe she became extremely lightheaded and felt as if she is going to pass out. She has a history of previous episodes of syncope which she attributes to chronic anemia from heavy periods. Patient denies any racing or skipping in her heart, complete loss of consciousness, hitting her head. Patient denies any numbness in her finger. She is right-hand dominant. She describes the pain in her finger as severe, throbbing, aching, better with elevation and ice. She tried Tylenol at home without significant relief of her symptoms.  Past Medical History  Diagnosis Date  . Abnormal Pap smear   . Anemia   . Mental disorder     hx of pp depression  . STD (female)     chlamydia and trich  . Hx MRSA infection     History only - 2 Negative MRSA swabs in 2012  . SVD (spontaneous vaginal delivery)     x 5  . Depression     no meds    Past Surgical History  Procedure Laterality Date  . Cervical biopsy  w/ loop electrode excision    . Wisdom tooth extraction    . Laparoscopic tubal ligation Bilateral 12/02/2013    Procedure: LAPAROSCOPIC TUBAL LIGATION WITH FILSCHE CLIPS;  Surgeon: Adam Phenix, MD;  Location: [facility] ORS;  Service: Gynecology;  Laterality: Bilateral;  FILSCHE CLIPS   Family History  Problem Relation Age of Onset   . Cancer Paternal Grandmother   . Hypertension Maternal Grandmother   . Heart disease Maternal Grandmother   . Diabetes Maternal Grandmother   . Stroke Maternal Grandmother   . Cancer Maternal Grandfather   . Hypertension Mother    Social History  Substance Use Topics  . Smoking status: Never Smoker   . Smokeless tobacco: Never Used  . Alcohol Use: Yes     Comment: Patient reports she drank Mad dog 20/20 on 04/03/14   OB History    Gravida Para Term Preterm AB TAB SAB Ectopic Multiple Living   4 4 4  0 0 0 0 0 1 5     Review of Systems  Constitutional: Negative for fever and chills.  Musculoskeletal: Positive for joint swelling.  Skin: Negative for wound.  Neurological: Negative for numbness.      Allergies  Review of patient's allergies indicates no known allergies.  Home Medications   Prior to Admission medications   Medication Sig Start Date End Date Taking? Authorizing Provider  naproxen (NAPROSYN) 375 MG tablet Take 1 tablet (375 mg total) by mouth 2 (two) times daily. 04/03/15   Arthor Captain, PA-C  oxyCODONE (OXY IR/ROXICODONE) 5 MG immediate release tablet Take 0.5-1 tablets (2.5-5 mg total) by mouth every 4 (four) hours as needed for severe pain. 04/03/15   Arthor Captain, PA-C  phenazopyridine (PYRIDIUM) 95 MG tablet Take 1 tablet (  95 mg total) by mouth 3 (three) times daily as needed for pain. Patient not taking: Reported on 04/03/2015 11/18/14   Dorathy Kinsman, CNM  sulfamethoxazole-trimethoprim (BACTRIM DS,SEPTRA DS) 800-160 MG per tablet Take 1 tablet by mouth 2 (two) times daily. Patient not taking: Reported on 04/03/2015 11/18/14   Dorathy Kinsman, CNM   BP 131/83 mmHg  Pulse 66  Temp(Src) 98.5 F (36.9 C) (Oral)  Resp 18  SpO2 100%  LMP 04/03/2015 (Exact Date) Physical Exam  Constitutional: She is oriented to person, place, and time. She appears well-developed and well-nourished. No distress.  HENT:  Head: Normocephalic and atraumatic.  Mouth/Throat:  Oropharynx is clear and moist.  Eyes: Conjunctivae and EOM are normal. Pupils are equal, round, and reactive to light. No scleral icterus.  No horizontal, vertical or rotational nystagmus  Neck: Normal range of motion. Neck supple.  Full active and passive ROM without pain No midline or paraspinal tenderness No nuchal rigidity or meningeal signs  Cardiovascular: Normal rate, regular rhythm and intact distal pulses.   Pulmonary/Chest: Effort normal and breath sounds normal. No respiratory distress. She has no wheezes. She has no rales.  Abdominal: Soft. Bowel sounds are normal. There is no tenderness. There is no rebound and no guarding.  Musculoskeletal: Normal range of motion.  Right middle finger with significant swelling, no ecchymosis, status and range of motion is limited due to severe pain. Finger is neurovascularly intact with less than 3 seconds of cap refill. No subungual hematoma. Range of motion of other fingers and wrist  Lymphadenopathy:    She has no cervical adenopathy.  Neurological: She is alert and oriented to person, place, and time. She has normal reflexes. No cranial nerve deficit. She exhibits normal muscle tone. Coordination normal.  Mental Status:  Alert, oriented, thought content appropriate. Speech fluent without evidence of aphasia. Able to follow 2 step commands without difficulty.  Cranial Nerves:  II:  Peripheral visual fields grossly normal, pupils equal, round, reactive to light III,IV, VI: ptosis not present, extra-ocular motions intact bilaterally  V,VII: smile symmetric, facial light touch sensation equal VIII: hearing grossly normal bilaterally  IX,X: midline uvula rise  XI: bilateral shoulder shrug equal and strong XII: midline tongue extension  Motor:  5/5 in upper and lower extremities bilaterally including strong and equal grip strength and dorsiflexion/plantar flexion Sensory: Pinprick and light touch normal in all extremities.  Deep Tendon  Reflexes: 2+ and symmetric  Cerebellar: normal finger-to-nose with bilateral upper extremities Gait: normal gait and balance CV: distal pulses palpable throughout   Skin: Skin is warm and dry. No rash noted. She is not diaphoretic.  Psychiatric: She has a normal mood and affect. Her behavior is normal. Judgment and thought content normal.  Nursing note and vitals reviewed.   ED Course  Procedures (including critical care time) Labs Review Labs Reviewed - No data to display  Imaging Review Dg Finger Middle Right  04/03/2015   CLINICAL DATA:  Patient's smashed right middle finger into hotel room door. Edema for 2 days.  EXAM: RIGHT MIDDLE FINGER 2+V  COMPARISON:  None.  FINDINGS: Soft tissue swelling about the distal aspect of the right third finger. Bones appear intact. No evidence of acute fracture or dislocation. No focal bone lesion or bone destruction. No radiopaque soft tissue foreign bodies.  IMPRESSION: Soft tissue swelling.  No acute bony abnormalities.   Electronically Signed   By: Burman Nieves M.D.   On: 04/03/2015 02:49   I have personally reviewed and  evaluated these images and lab results as part of my medical decision-making.   EKG Interpretation None      MDM   Final diagnoses:  Finger injury, right, initial encounter    Patient X-Ray negative for obvious fracture or dislocation. Pain managed in ED. Pt advised to follow up with orthopedics if symptoms persist for possibility of missed fracture diagnosis. Patient given brace while in ED, conservative therapy recommended and discussed. Patient will be dc home & is agreeable with above plan.    Arthor Captain, PA-C 04/03/15 1610  Dione Booze, MD 04/03/15 [phone]

## 2015-04-03 NOTE — Discharge Instructions (Signed)
Fingertip Injuries and Amputations °Fingertip injuries are common and often get injured because they are last to escape when pulling your hand out of harm's way. You have amputated (cut off) part of your finger. How this turns out depends largely on how much was amputated. If just the tip is amputated, often the end of the finger will grow back and the finger may return to much the same as it was before the injury.  °If more of the finger is missing, your caregiver has done the best with the tissue remaining to allow you to keep as much finger as is possible. Your caregiver after checking your injury has tried to leave you with a painless fingertip that has durable, feeling skin. If possible, your caregiver has tried to maintain the finger's length and appearance and preserve its fingernail.  °Please read the instructions outlined below and refer to this sheet in the next few weeks. These instructions provide you with general information on caring for yourself. Your caregiver may also give you specific instructions. While your treatment has been done according to the most current medical practices available, unavoidable complications occasionally occur. If you have any problems or questions after discharge, please call your caregiver. °HOME CARE INSTRUCTIONS  °· You may resume normal diet and activities as directed or allowed. °· Keep your hand elevated above the level of your heart. This helps decrease pain and swelling. °· Keep ice packs (or a bag of ice wrapped in a towel) on the injured area for 15-20 minutes, 03-04 times per day, for the first two days. °· Change dressings if necessary or as directed. °· Clean the wound daily or as directed. °· Only take over-the-counter or prescription medicines for pain, discomfort, or fever as directed by your caregiver. °· Keep appointments as directed. °SEEK IMMEDIATE MEDICAL CARE IF: °· You develop redness, swelling, numbness or increasing pain in the wound. °· There is  pus coming from the wound. °· You develop an unexplained oral temperature above 102° F (38.9° C) or as your caregiver suggests. °· There is a foul (bad) smell coming from the wound or dressing. °· There is a breaking open of the wound (edges not staying together) after sutures or staples have been removed. °MAKE SURE YOU:  °· Understand these instructions. °· Will watch your condition. °· Will get help right away if you are not doing well or get worse. °Document Released: 06/14/2005 Document Revised: 10/16/2011 Document Reviewed: 05/13/2008 °ExitCare® Patient Information ©2015 [employer]. This information is not intended to replace advice given to you by your health care provider. Make sure you discuss any questions you have with your health care provider. ° °

## 2015-04-03 NOTE — ED Notes (Signed)
Patient states she has already applied ice with no relief of pain while waiting.

## 2015-04-03 NOTE — ED Notes (Signed)
Patient here with complaint of right middle finger "getting smashed by one of my twins". States her hand was in the door frame of a sliding door when the child closed the door. Finger is swollen and tender.

## 2015-04-13 ENCOUNTER — Observation Stay (HOSPITAL_COMMUNITY): Payer: Medicaid Other | Admitting: Anesthesiology

## 2015-04-13 ENCOUNTER — Emergency Department (HOSPITAL_COMMUNITY): Payer: Medicaid Other

## 2015-04-13 ENCOUNTER — Inpatient Hospital Stay (HOSPITAL_COMMUNITY)
Admission: EM | Admit: 2015-04-13 | Discharge: 2015-04-16 | DRG: 513 | Disposition: A | Discharge status: Home Health | Payer: Medicaid Other | Attending: Oncology | Admitting: Oncology

## 2015-04-13 ENCOUNTER — Encounter (HOSPITAL_COMMUNITY)
Admission: EM | Disposition: A | Discharge status: Home Health | Payer: Self-pay | Source: Home / Self Care | Attending: Oncology

## 2015-04-13 ENCOUNTER — Encounter (HOSPITAL_COMMUNITY): Payer: Self-pay | Admitting: Emergency Medicine

## 2015-04-13 ENCOUNTER — Emergency Department (HOSPITAL_COMMUNITY)
Admission: EM | Admit: 2015-04-13 | Discharge: 2015-04-13 | Disposition: A | Discharge status: Home / Self Care | Payer: Medicaid Other | Attending: Emergency Medicine | Admitting: Emergency Medicine

## 2015-04-13 DIAGNOSIS — B9562 Methicillin resistant Staphylococcus aureus infection as the cause of diseases classified elsewhere: Secondary | ICD-10-CM | POA: Diagnosis not present

## 2015-04-13 DIAGNOSIS — E876 Hypokalemia: Secondary | ICD-10-CM | POA: Diagnosis not present

## 2015-04-13 DIAGNOSIS — L089 Local infection of the skin and subcutaneous tissue, unspecified: Secondary | ICD-10-CM | POA: Diagnosis present

## 2015-04-13 DIAGNOSIS — N390 Urinary tract infection, site not specified: Secondary | ICD-10-CM | POA: Diagnosis not present

## 2015-04-13 DIAGNOSIS — N920 Excessive and frequent menstruation with regular cycle: Secondary | ICD-10-CM | POA: Diagnosis present

## 2015-04-13 DIAGNOSIS — M868X4 Other osteomyelitis, hand: Secondary | ICD-10-CM | POA: Diagnosis present

## 2015-04-13 DIAGNOSIS — M65141 Other infective (teno)synovitis, right hand: Secondary | ICD-10-CM | POA: Diagnosis not present

## 2015-04-13 DIAGNOSIS — W230XXA Caught, crushed, jammed, or pinched between moving objects, initial encounter: Secondary | ICD-10-CM | POA: Diagnosis present

## 2015-04-13 DIAGNOSIS — M659 Synovitis and tenosynovitis, unspecified: Secondary | ICD-10-CM | POA: Diagnosis present

## 2015-04-13 DIAGNOSIS — Z23 Encounter for immunization: Secondary | ICD-10-CM

## 2015-04-13 DIAGNOSIS — L02511 Cutaneous abscess of right hand: Secondary | ICD-10-CM | POA: Diagnosis present

## 2015-04-13 DIAGNOSIS — D500 Iron deficiency anemia secondary to blood loss (chronic): Secondary | ICD-10-CM | POA: Diagnosis present

## 2015-04-13 DIAGNOSIS — D62 Acute posthemorrhagic anemia: Secondary | ICD-10-CM | POA: Diagnosis not present

## 2015-04-13 DIAGNOSIS — B9689 Other specified bacterial agents as the cause of diseases classified elsewhere: Secondary | ICD-10-CM | POA: Diagnosis not present

## 2015-04-13 DIAGNOSIS — M65841 Other synovitis and tenosynovitis, right hand: Secondary | ICD-10-CM | POA: Diagnosis not present

## 2015-04-13 DIAGNOSIS — M86141 Other acute osteomyelitis, right hand: Secondary | ICD-10-CM | POA: Diagnosis not present

## 2015-04-13 DIAGNOSIS — M869 Osteomyelitis, unspecified: Secondary | ICD-10-CM | POA: Diagnosis present

## 2015-04-13 DIAGNOSIS — M65949 Unspecified synovitis and tenosynovitis, unspecified hand: Secondary | ICD-10-CM | POA: Diagnosis present

## 2015-04-13 DIAGNOSIS — D509 Iron deficiency anemia, unspecified: Secondary | ICD-10-CM | POA: Diagnosis present

## 2015-04-13 DIAGNOSIS — Z9889 Other specified postprocedural states: Secondary | ICD-10-CM | POA: Diagnosis not present

## 2015-04-13 HISTORY — PX: INCISION AND DRAINAGE: SHX5863

## 2015-04-13 HISTORY — DX: Postpartum depression: F53.0

## 2015-04-13 HISTORY — DX: Iron deficiency anemia, unspecified: D50.9

## 2015-04-13 HISTORY — PX: I&D EXTREMITY: SHX5045

## 2015-04-13 HISTORY — DX: Other mental disorders complicating the puerperium: O99.345

## 2015-04-13 LAB — CBC
HCT: 35.9 % — ABNORMAL LOW (ref 36.0–46.0)
Hemoglobin: 11.8 g/dL — ABNORMAL LOW (ref 12.0–15.0)
MCH: 29.3 pg (ref 26.0–34.0)
MCHC: 32.9 g/dL (ref 30.0–36.0)
MCV: 89.1 fL (ref 78.0–100.0)
PLATELETS: 259 10*3/uL (ref 150–400)
RBC: 4.03 MIL/uL (ref 3.87–5.11)
RDW: 11.8 % (ref 11.5–15.5)
WBC: 5.2 10*3/uL (ref 4.0–10.5)

## 2015-04-13 LAB — BASIC METABOLIC PANEL
ANION GAP: 5 (ref 5–15)
BUN: 12 mg/dL (ref 6–20)
CO2: 28 mmol/L (ref 22–32)
Calcium: 9.3 mg/dL (ref 8.9–10.3)
Chloride: 103 mmol/L (ref 101–111)
Creatinine, Ser: 0.9 mg/dL (ref 0.44–1.00)
Glucose, Bld: 90 mg/dL (ref 65–99)
POTASSIUM: 3.3 mmol/L — AB (ref 3.5–5.1)
SODIUM: 136 mmol/L (ref 135–145)

## 2015-04-13 LAB — SURGICAL PCR SCREEN
MRSA, PCR: NEGATIVE
STAPHYLOCOCCUS AUREUS: NEGATIVE

## 2015-04-13 LAB — MAGNESIUM: MAGNESIUM: 1.9 mg/dL (ref 1.7–2.4)

## 2015-04-13 LAB — SEDIMENTATION RATE: SED RATE: 28 mm/h — AB (ref 0–22)

## 2015-04-13 SURGERY — IRRIGATION AND DEBRIDEMENT EXTREMITY
Anesthesia: General | Site: Finger | Laterality: Right

## 2015-04-13 MED ORDER — MIDAZOLAM HCL 5 MG/5ML IJ SOLN
INTRAMUSCULAR | Status: DC | PRN
  Administered 2015-04-13: 2 mg via INTRAVENOUS

## 2015-04-13 MED ORDER — ONDANSETRON HCL 4 MG/2ML IJ SOLN
4.0000 mg | Freq: Once | INTRAMUSCULAR | Status: AC
  Administered 2015-04-13: 4 mg via INTRAVENOUS
  Filled 2015-04-13: qty 2

## 2015-04-13 MED ORDER — FENTANYL CITRATE (PF) 250 MCG/5ML IJ SOLN
INTRAMUSCULAR | Status: AC
  Filled 2015-04-13: qty 5

## 2015-04-13 MED ORDER — PROPOFOL 10 MG/ML IV BOLUS
INTRAVENOUS | Status: AC
  Filled 2015-04-13: qty 20

## 2015-04-13 MED ORDER — ONDANSETRON HCL 4 MG/2ML IJ SOLN
4.0000 mg | Freq: Four times a day (QID) | INTRAMUSCULAR | Status: DC | PRN
  Administered 2015-04-14: 4 mg via INTRAVENOUS
  Filled 2015-04-13: qty 2

## 2015-04-13 MED ORDER — SENNOSIDES-DOCUSATE SODIUM 8.6-50 MG PO TABS: 1.0000 | ORAL_TABLET | Freq: Every evening | ORAL | Status: DC | PRN

## 2015-04-13 MED ORDER — SODIUM CHLORIDE 0.9 % IR SOLN
Status: DC | PRN
  Administered 2015-04-13: 1000 mL

## 2015-04-13 MED ORDER — ENOXAPARIN SODIUM 40 MG/0.4ML ~~LOC~~ SOLN: 40.0000 mg | SUBCUTANEOUS | Status: DC

## 2015-04-13 MED ORDER — PROMETHAZINE HCL 25 MG/ML IJ SOLN: 6.2500 mg | INTRAMUSCULAR | Status: DC | PRN

## 2015-04-13 MED ORDER — MORPHINE SULFATE (PF) 4 MG/ML IV SOLN: 4.0000 mg | INTRAVENOUS | Status: DC | PRN

## 2015-04-13 MED ORDER — ONDANSETRON HCL 4 MG/2ML IJ SOLN
INTRAMUSCULAR | Status: DC | PRN
  Administered 2015-04-13: 4 mg via INTRAVENOUS

## 2015-04-13 MED ORDER — MORPHINE SULFATE (PF) 4 MG/ML IV SOLN
2.0000 mg | INTRAVENOUS | Status: DC | PRN
  Administered 2015-04-13 – 2015-04-15 (×4): 2 mg via INTRAVENOUS
  Filled 2015-04-13 (×5): qty 1

## 2015-04-13 MED ORDER — TETANUS-DIPHTH-ACELL PERTUSSIS 5-2.5-18.5 LF-MCG/0.5 IM SUSP
0.5000 mL | Freq: Once | INTRAMUSCULAR | Status: AC
  Administered 2015-04-13: 0.5 mL via INTRAMUSCULAR
  Filled 2015-04-13: qty 0.5

## 2015-04-13 MED ORDER — MIDAZOLAM HCL 2 MG/2ML IJ SOLN
INTRAMUSCULAR | Status: AC
  Filled 2015-04-13: qty 4

## 2015-04-13 MED ORDER — PROPOFOL 10 MG/ML IV BOLUS
INTRAVENOUS | Status: DC | PRN
  Administered 2015-04-13: 170 mg via INTRAVENOUS

## 2015-04-13 MED ORDER — LACTATED RINGERS IV SOLN
INTRAVENOUS | Status: DC
  Administered 2015-04-13 – 2015-04-16 (×2): via INTRAVENOUS

## 2015-04-13 MED ORDER — POTASSIUM CHLORIDE CRYS ER 20 MEQ PO TBCR
40.0000 meq | EXTENDED_RELEASE_TABLET | Freq: Once | ORAL | Status: AC
  Administered 2015-04-14: 40 meq via ORAL
  Filled 2015-04-13: qty 2

## 2015-04-13 MED ORDER — LIDOCAINE HCL (CARDIAC) 20 MG/ML IV SOLN
INTRAVENOUS | Status: AC
  Filled 2015-04-13: qty 5

## 2015-04-13 MED ORDER — OXYCODONE HCL 5 MG PO TABS
ORAL_TABLET | ORAL | Status: AC
  Administered 2015-04-13: 10 mg
  Filled 2015-04-13: qty 1

## 2015-04-13 MED ORDER — HYDROCODONE-ACETAMINOPHEN 5-325 MG PO TABS
1.0000 | ORAL_TABLET | ORAL | Status: DC | PRN
  Administered 2015-04-14 – 2015-04-15 (×2): 2 via ORAL
  Filled 2015-04-13 (×2): qty 2

## 2015-04-13 MED ORDER — ACETAMINOPHEN 325 MG PO TABS: 650.0000 mg | ORAL_TABLET | Freq: Four times a day (QID) | ORAL | Status: DC | PRN

## 2015-04-13 MED ORDER — LIDOCAINE HCL (CARDIAC) 20 MG/ML IV SOLN
INTRAVENOUS | Status: DC | PRN
  Administered 2015-04-13: 100 mg via INTRAVENOUS

## 2015-04-13 MED ORDER — VANCOMYCIN HCL IN DEXTROSE 750-5 MG/150ML-% IV SOLN
750.0000 mg | Freq: Three times a day (TID) | INTRAVENOUS | Status: DC
  Administered 2015-04-13 – 2015-04-14 (×2): 750 mg via INTRAVENOUS
  Filled 2015-04-13 (×4): qty 150

## 2015-04-13 MED ORDER — FENTANYL CITRATE (PF) 100 MCG/2ML IJ SOLN
INTRAMUSCULAR | Status: DC | PRN
  Administered 2015-04-13 (×3): 50 ug via INTRAVENOUS

## 2015-04-13 MED ORDER — CEFAZOLIN SODIUM-DEXTROSE 2-3 GM-% IV SOLR
INTRAVENOUS | Status: DC | PRN
  Administered 2015-04-13: 2 g via INTRAVENOUS

## 2015-04-13 MED ORDER — MELOXICAM 7.5 MG PO TABS
15.0000 mg | ORAL_TABLET | Freq: Every day | ORAL | Status: DC
  Administered 2015-04-14 – 2015-04-15 (×2): 15 mg via ORAL
  Filled 2015-04-13 (×2): qty 2

## 2015-04-13 MED ORDER — OXYCODONE HCL 5 MG PO TABS
5.0000 mg | ORAL_TABLET | ORAL | Status: DC | PRN
  Administered 2015-04-14 (×4): 10 mg via ORAL
  Filled 2015-04-13 (×4): qty 2

## 2015-04-13 MED ORDER — HYDROMORPHONE HCL 1 MG/ML IJ SOLN
0.2500 mg | INTRAMUSCULAR | Status: DC | PRN
  Administered 2015-04-13 (×2): 0.5 mg via INTRAVENOUS

## 2015-04-13 MED ORDER — HYDROMORPHONE HCL 1 MG/ML IJ SOLN
INTRAMUSCULAR | Status: AC
  Filled 2015-04-13: qty 1

## 2015-04-13 MED ORDER — ONDANSETRON HCL 4 MG/2ML IJ SOLN: 4.0000 mg | Freq: Three times a day (TID) | INTRAMUSCULAR | Status: DC | PRN

## 2015-04-13 MED ORDER — OXYCODONE HCL 5 MG PO TABS
ORAL_TABLET | ORAL | Status: AC
  Filled 2015-04-13: qty 1

## 2015-04-13 MED ORDER — MORPHINE SULFATE (PF) 4 MG/ML IV SOLN
4.0000 mg | Freq: Once | INTRAVENOUS | Status: AC
  Administered 2015-04-13: 4 mg via INTRAVENOUS
  Filled 2015-04-13: qty 1

## 2015-04-13 MED ORDER — PIPERACILLIN-TAZOBACTAM 3.375 G IVPB
3.3750 g | Freq: Three times a day (TID) | INTRAVENOUS | Status: DC
  Administered 2015-04-13 – 2015-04-15 (×5): 3.375 g via INTRAVENOUS
  Filled 2015-04-13 (×7): qty 50

## 2015-04-13 MED ORDER — ACETAMINOPHEN 650 MG RE SUPP: 650.0000 mg | Freq: Four times a day (QID) | RECTAL | Status: DC | PRN

## 2015-04-13 MED ORDER — ONDANSETRON HCL 4 MG PO TABS: 4.0000 mg | ORAL_TABLET | Freq: Four times a day (QID) | ORAL | Status: DC | PRN

## 2015-04-13 MED ORDER — SODIUM CHLORIDE 0.9 % IV SOLN
INTRAVENOUS | Status: AC
Start: 2015-04-13 — End: 2015-04-14
  Administered 2015-04-13: 19:00:00 via INTRAVENOUS

## 2015-04-13 MED ORDER — MEPERIDINE HCL 25 MG/ML IJ SOLN
6.2500 mg | INTRAMUSCULAR | Status: DC | PRN
  Administered 2015-04-13: 6.25 mg via INTRAVENOUS

## 2015-04-13 MED ORDER — INFLUENZA VAC SPLIT QUAD 0.5 ML IM SUSY
0.5000 mL | PREFILLED_SYRINGE | INTRAMUSCULAR | Status: AC
  Administered 2015-04-14: 0.5 mL via INTRAMUSCULAR
  Filled 2015-04-13: qty 0.5

## 2015-04-13 MED ORDER — MEPERIDINE HCL 25 MG/ML IJ SOLN
INTRAMUSCULAR | Status: AC
  Filled 2015-04-13: qty 1

## 2015-04-13 SURGICAL SUPPLY — 52 items
BNDG COHESIVE 4X5 TAN STRL (GAUZE/BANDAGES/DRESSINGS) ×3 IMPLANT
BNDG ESMARK 4X9 LF (GAUZE/BANDAGES/DRESSINGS) ×3 IMPLANT
BNDG GAUZE ELAST 4 BULKY (GAUZE/BANDAGES/DRESSINGS) ×3 IMPLANT
CHLORAPREP W/TINT 26ML (MISCELLANEOUS) ×3 IMPLANT
COVER SURGICAL LIGHT HANDLE (MISCELLANEOUS) ×3 IMPLANT
CUFF TOURNIQUET SINGLE 18IN (TOURNIQUET CUFF) ×3 IMPLANT
CUFF TOURNIQUET SINGLE 24IN (TOURNIQUET CUFF) IMPLANT
DRAPE SURG 17X23 STRL (DRAPES) ×3 IMPLANT
DRSG ADAPTIC 3X8 NADH LF (GAUZE/BANDAGES/DRESSINGS) ×3 IMPLANT
ELECT REM PT RETURN 9FT ADLT (ELECTROSURGICAL)
ELECTRODE REM PT RTRN 9FT ADLT (ELECTROSURGICAL) IMPLANT
EVACUATOR 1/8 PVC DRAIN (DRAIN) IMPLANT
GAUZE PACKING IODOFORM 1/4X15 (GAUZE/BANDAGES/DRESSINGS) ×3 IMPLANT
GAUZE SPONGE 4X4 12PLY STRL (GAUZE/BANDAGES/DRESSINGS) ×3 IMPLANT
GAUZE XEROFORM 5X9 LF (GAUZE/BANDAGES/DRESSINGS) ×3 IMPLANT
GLOVE BIO SURGEON STRL SZ 6.5 (GLOVE) ×2 IMPLANT
GLOVE BIO SURGEON STRL SZ7.5 (GLOVE) ×3 IMPLANT
GLOVE BIO SURGEONS STRL SZ 6.5 (GLOVE) ×1
GLOVE BIOGEL PI IND STRL 7.0 (GLOVE) ×1 IMPLANT
GLOVE BIOGEL PI IND STRL 8 (GLOVE) ×1 IMPLANT
GLOVE BIOGEL PI INDICATOR 7.0 (GLOVE) ×2
GLOVE BIOGEL PI INDICATOR 8 (GLOVE) ×2
GOWN STRL REUS W/ TWL LRG LVL3 (GOWN DISPOSABLE) ×3 IMPLANT
GOWN STRL REUS W/TWL LRG LVL3 (GOWN DISPOSABLE) ×6
HANDPIECE INTERPULSE COAX TIP (DISPOSABLE)
KIT BASIN OR (CUSTOM PROCEDURE TRAY) ×3 IMPLANT
KIT ROOM TURNOVER OR (KITS) ×3 IMPLANT
MANIFOLD NEPTUNE II (INSTRUMENTS) ×3 IMPLANT
NS IRRIG 1000ML POUR BTL (IV SOLUTION) ×3 IMPLANT
PACK ORTHO EXTREMITY (CUSTOM PROCEDURE TRAY) ×3 IMPLANT
PAD ARMBOARD 7.5X6 YLW CONV (MISCELLANEOUS) ×6 IMPLANT
PAD CAST 4YDX4 CTTN HI CHSV (CAST SUPPLIES) ×1 IMPLANT
PADDING CAST COTTON 4X4 STRL (CAST SUPPLIES) ×2
SET HNDPC FAN SPRY TIP SCT (DISPOSABLE) IMPLANT
SPLINT PLASTER CAST XFAST 3X15 (CAST SUPPLIES) ×1 IMPLANT
SPLINT PLASTER XTRA FASTSET 3X (CAST SUPPLIES) ×2
SPONGE LAP 18X18 X RAY DECT (DISPOSABLE) IMPLANT
STOCKINETTE IMPERVIOUS 9X36 MD (GAUZE/BANDAGES/DRESSINGS) IMPLANT
SUT ETHILON 4 0 PS 2 18 (SUTURE) IMPLANT
SUT PROLENE 1 CT (SUTURE) IMPLANT
SUT VICRYL RAPIDE 4/0 PS 2 (SUTURE) IMPLANT
SYR 20CC LL (SYRINGE) ×3 IMPLANT
SYRINGE 10CC LL (SYRINGE) IMPLANT
TOWEL OR 17X24 6PK STRL BLUE (TOWEL DISPOSABLE) ×3 IMPLANT
TOWEL OR 17X26 10 PK STRL BLUE (TOWEL DISPOSABLE) ×3 IMPLANT
TUBE ANAEROBIC SPECIMEN COL (MISCELLANEOUS) IMPLANT
TUBE CONNECTING 12'X1/4 (SUCTIONS) ×1
TUBE CONNECTING 12X1/4 (SUCTIONS) ×2 IMPLANT
TUBING CYSTO DISP (UROLOGICAL SUPPLIES) ×3 IMPLANT
UNDERPAD 30X30 INCONTINENT (UNDERPADS AND DIAPERS) ×3 IMPLANT
WATER STERILE IRR 1000ML POUR (IV SOLUTION) ×3 IMPLANT
YANKAUER SUCT BULB TIP NO VENT (SUCTIONS) ×3 IMPLANT

## 2015-04-13 NOTE — Progress Notes (Signed)
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  Flexor sheath irrigated with distal flow.  Packing left in pulp and palm wounds--I will plan to pull them on Thursday.    I went ahead and started Vanc and Zosyn in postop orders, which were outlined in Medicine H&P as the plan.  May leave dressing until I pull packing and start ROM/hand therapy on Thursday. No chemical anticoagulation please due to persisting open wounds and possibility of needing to return to OR.  SCDs and frequent ambulation please.  Have also started meloxicam to help with postop inflammatory cascade.  Neil Crouch, MD Hand Surgery Mobile [phone]

## 2015-04-13 NOTE — Anesthesia Preprocedure Evaluation (Signed)
Anesthesia Evaluation  Patient identified by MRN, date of birth, ID band Patient awake    Reviewed: Allergy & Precautions, H&P , NPO status , Patient's Chart, lab work & pertinent test results  Airway Mallampati: II  TM Distance: >3 FB Neck ROM: Full    Dental no notable dental hx. (+) Missing, Teeth Intact   Pulmonary neg pulmonary ROS,  breath sounds clear to auscultation  Pulmonary exam normal       Cardiovascular negative cardio ROS Normal cardiovascular examRhythm:Regular Rate:Normal     Neuro/Psych PSYCHIATRIC DISORDERS Depression negative neurological ROS     GI/Hepatic negative GI ROS, Neg liver ROS,   Endo/Other  negative endocrine ROS  Renal/GU negative Renal ROS  negative genitourinary   Musculoskeletal negative musculoskeletal ROS (+)   Abdominal Normal abdominal exam  (+)   Peds  Hematology  (+) anemia , Hx/o Thrombocytopenia   Anesthesia Other Findings   Reproductive/Obstetrics                             Anesthesia Physical  Anesthesia Plan  ASA: II  Anesthesia Plan: General   Post-op Pain Management:    Induction: Intravenous  Airway Management Planned: Oral ETT  Additional Equipment:   Intra-op Plan:   Post-operative Plan: Extubation in OR  Informed Consent: I have reviewed the patients History and Physical, chart, labs and discussed the procedure including the risks, benefits and alternatives for the proposed anesthesia with the patient or authorized representative who has indicated his/her understanding and acceptance.   Dental advisory given  Plan Discussed with: CRNA  Anesthesia Plan Comments:         Anesthesia Quick Evaluation

## 2015-04-13 NOTE — Progress Notes (Signed)
ANTIBIOTIC CONSULT NOTE - INITIAL  Pharmacy Consult for Vanco/Zosyn Indication: wound infection  No Known Allergies  Patient Measurements: Height:  (185.4 cm) Weight: 135 lb (61.236 kg) IBW/kg (Calculated) : 75.4 Adjusted Body Weight:    Vital Signs: Temp: 98.8 F (37.1 C) (09/06 2145) Temp Source: Oral (09/06 1943) BP: 135/79 mmHg (09/06 2136) Pulse Rate: 72 (09/06 2145) Intake/Output from previous day:   Intake/Output from this shift: Total I/O In: 760 [P.O.:60; I.V.:700] Out: -   Labs:  Recent Labs  04/13/15 1314  WBC 5.2  HGB 11.8*  PLT 259  CREATININE 0.90   Estimated Creatinine Clearance: 89.1 mL/min (by C-G formula based on Cr of 0.9). No results for input(s): VANCOTROUGH, VANCOPEAK, VANCORANDOM, GENTTROUGH, GENTPEAK, GENTRANDOM, TOBRATROUGH, TOBRAPEAK, TOBRARND, AMIKACINPEAK, AMIKACINTROU, AMIKACIN in the last 72 hours.   Microbiology: Recent Results (from the past 720 hour(s))  Surgical pcr screen     Status: None   Collection Time: 04/13/15  6:35 PM  Result Value Ref Range Status   MRSA, PCR NEGATIVE NEGATIVE Final   Staphylococcus aureus NEGATIVE NEGATIVE Final    Comment:        The Xpert SA Assay (FDA approved for NASAL specimens in patients over 29 years of age), is one component of a comprehensive surveillance program.  Test performance has been validated by [facility] for patients greater than or equal to 4 year old. It is not intended to diagnose infection nor to guide or monitor treatment.     Medical History: Past Medical History  Diagnosis Date  . Abnormal Pap smear   . Anemia   . Mental disorder     hx of pp depression  . STD (female)     chlamydia and trich  . Hx MRSA infection     History only - 2 Negative MRSA swabs in 2012  . SVD (spontaneous vaginal delivery)     x 5  . Depression     no meds     Medications:  Prescriptions prior to admission  Medication Sig Dispense Refill Last Dose  . naproxen  (NAPROSYN) 375 MG tablet Take 1 tablet (375 mg total) by mouth 2 (two) times daily. 20 tablet 0 04/12/2015 at Unknown time  . oxyCODONE (OXY IR/ROXICODONE) 5 MG immediate release tablet Take 0.5-1 tablets (2.5-5 mg total) by mouth every 4 (four) hours as needed for severe pain. 10 tablet 0 04/12/2015 at Unknown time   Assessment: 29 y/o AAF that presents c/o finger infection. Seen in the ED on 8/27 following having her right middle finger crushed by a sliding door on 8/25. Was treated with Bactrim then. X-rays done in the ED with multiple areas of bone destruction of the distal phalanx of the right middle finger consistent with osteomyelitis. I&D of R long finger. Scr 0.9. CrCl 89.  Goal of Therapy:  Vancomycin trough level 15-20 mcg/ml  Plan:  Zosyn 3.375g IV q8hr. Vancomycin  IV q8hr.  Trough after 3-5 doses at steady state.   Simmone Cape S. Merilynn Finland, PharmD, BCPS Clinical Staff Pharmacist Pager [phone]  Misty Stanley Stillinger 04/13/2015,10:06 PM

## 2015-04-13 NOTE — Op Note (Signed)
04/13/2015  8:12 PM  PATIENT:  [name]  29 y.o. female  PRE-OPERATIVE DIAGNOSIS:  Right long finger deep infection with likely early septic flexor tenosynovitis  POST-OPERATIVE DIAGNOSIS:  Right long finger abscess about the distal phalanx, with septic flexor tenosynovitis  PROCEDURE:  Right long finger excisional debridement of skin and subcutaneous tissues, with irrigation/drainage of the flexor tendon sheath, and removal of nail plate  SURGEON: Cliffton Asters. Janee Morn, MD  PHYSICIAN ASSISTANT: None  ANESTHESIA:  general  SPECIMENS:  None  DRAINS:   None  EBL:  less than 50 mL  PREOPERATIVE INDICATIONS:  [name] is a  29 y.o. female with right long finger tip abscess and likely early septic flexor tenosynovitis  The risks benefits and alternatives were discussed with the patient preoperatively including but not limited to the risks of infection, bleeding, nerve injury, cardiopulmonary complications, the need for revision surgery, among others, and the patient verbalized understanding and consented to proceed.  OPERATIVE IMPLANTS: None  OPERATIVE PROCEDURE:  After withholding prophylactic antibiotics for the sake of obtaining deep intraoperative cultures, the patient was escorted to the operative theatre and placed in a supine position. General anesthesia was administered A surgical "time-out" was performed during which the planned procedure, proposed operative site, and the correct patient identity were compared to the operative consent and agreement confirmed by the circulating nurse according to current facility policy.  Following application of a tourniquet to the operative extremity, the exposed skin was prepped with Chloraprep and draped in the usual sterile fashion.  The limb was exsanguinated with gravity and the tourniquet inflated to approximately higher than systolic BP.  I began by opening the blister on the dorsum of the digit that extended from the level of  the PIP joint dorsally to the base of the nail. Thick purulent type material was within that. Cultures were obtained. The epidermis was peeled circumferentially about the finger from the level of the PIP joint distally. This revealed  a hole on the radial margin of the nail fold. The distal half of the nail plate was detached from the nail bed. I subsequently disrupted adhesion between the nail plate in the nailbed for the remainder of the nail plate, and removed it. Distal half of the distal phalanx was completely devoid of any soft tissue attached to it. I excisionally debrided some of the skin, subcutaneous tissues, and margins of the nailbed.  The undersurface of the nailbed was floppy and unattached to distal half of the distal phalanx, and there was an abscess cavity deep to it between the volar pulp and the distal phalanx. I irrigated this copiously with a syringe. Using a clean knife, an oblique incision was made in the palm at the base of the long finger, proximal to the proximal edge of the A1 pulley. With spreading dissection, carried this down to the level of the flexor tendons. The fluid here was fairly clean and did not appear purulent. Nonetheless I introduced a pediatric feeding tube into the flexor tendon sheath and irrigated copiously. Fluid came forth distally from the separation between the distal phalanx in the pulp tissue. After irrigating it like this, I used a mosquito, advanced in a in the volar subcutaneous space from the tip of the digit proximally as far as the PIP flexion crease. Next a made a small oblique incision over the proximal phalanx and inserted the pediatric feeding tube appear, pointing distally. I then flushed the subcutaneous space of the long finger copiously with  it again with irrigant pouring forth from the tip of the digit. Tourniquet was released, the proximal and distal wounds were packed with iodoform and a bulky dressing was applied with the finger fully extended and  a plaster component to the dressing. She was awakened and taken to the recovery room stable condition, breathing spontaneously.  DISPOSITION: She will return to the floor under the care of her primary team, who will direct her care as it relates to treating the infection medically through to resolution.  I will remain involved to the extent that she has wounds to heal, might require additional debridement of necrotic tissue, and could ultimately have functional deficits to address through therapy, etc.

## 2015-04-13 NOTE — Anesthesia Procedure Notes (Signed)
Procedure Name: LMA Insertion Date/Time: 04/13/2015 8:20 PM Performed by: Arlice Colt B Pre-anesthesia Checklist: Patient identified, Emergency Drugs available, Suction available, Patient being monitored and Timeout performed Patient Re-evaluated:Patient Re-evaluated prior to inductionOxygen Delivery Method: Circle system utilized Preoxygenation: Pre-oxygenation with 100% oxygen Intubation Type: IV induction LMA: LMA inserted LMA Size: 4.0 Number of attempts: 1 Placement Confirmation: positive ETCO2 and breath sounds checked- equal and bilateral Tube secured with: Tape Dental Injury: Teeth and Oropharynx as per pre-operative assessment

## 2015-04-13 NOTE — ED Notes (Signed)
Report called to [facility] in Short Stay. Urine obtained for pregnancy test. Belongings sent with pt to OR.

## 2015-04-13 NOTE — Consult Note (Signed)
ORTHOPAEDIC CONSULTATION HISTORY & PHYSICAL REQUESTING PHYSICIAN: Levert Feinstein, MD  Chief Complaint: Right long finger problem  HPI: [name] is a 29 y.o. female who presents to the emergency department for evaluation of her right long finger. Apparently she closed the finger in a door on 04-01-15. Because of pain she presented to the emergency department a few days later where x-rays were normal. She reports that sometime thereafter she began to have some drainage from around the nail and has subsequently managed and drainage with wound care, etc. The pain and swelling at the end of the digit as well as the drainage has worsened and she presents for further evaluation. She reports having eaten a sausage biscuit at about 11:30 AM.  Past Medical History  Diagnosis Date  . Abnormal Pap smear   . Anemia   . Mental disorder     hx of pp depression  . STD (female)     chlamydia and trich  . Hx MRSA infection     History only - 2 Negative MRSA swabs in 2012  . SVD (spontaneous vaginal delivery)     x 5  . Depression     no meds    Past Surgical History  Procedure Laterality Date  . Cervical biopsy  w/ loop electrode excision    . Wisdom tooth extraction    . Laparoscopic tubal ligation Bilateral 12/02/2013    Procedure: LAPAROSCOPIC TUBAL LIGATION WITH FILSCHE CLIPS;  Surgeon: Adam Phenix, MD;  Location: [facility];  Service: Gynecology;  Laterality: Bilateral;  FILSCHE CLIPS   Social History   Social History  . Marital Status: Single    Spouse Name: N/A  . Number of Children: N/A  . Years of Education: N/A   Social History Main Topics  . Smoking status: Never Smoker   . Smokeless tobacco: Never Used  . Alcohol Use: Yes     Comment: Patient reports she drank Mad dog 20/20 on 04/03/14  . Drug Use: No     Comment: Patient denies   . Sexual Activity: Yes    Birth Control/ Protection: Surgical   Other Topics Concern  . None   Social History Narrative   Family  History  Problem Relation Age of Onset  . Cancer Paternal Grandmother   . Hypertension Maternal Grandmother   . Heart disease Maternal Grandmother   . Diabetes Maternal Grandmother   . Stroke Maternal Grandmother   . Cancer Maternal Grandfather   . Hypertension Mother    No Known Allergies Prior to Admission medications   Medication Sig Start Date End Date Taking? Authorizing Provider  naproxen (NAPROSYN) 375 MG tablet Take 1 tablet (375 mg total) by mouth 2 (two) times daily. 04/03/15  Yes Arthor Captain, PA-C  oxyCODONE (OXY IR/ROXICODONE) 5 MG immediate release tablet Take 0.5-1 tablets (2.5-5 mg total) by mouth every 4 (four) hours as needed for severe pain. 04/03/15  Yes Arthor Captain, PA-C   Dg Finger Middle Right  04/13/2015   CLINICAL DATA:  Pain and swelling of the distal right middle finger due to a crushing injury 04/01/2015.  EXAM: RIGHT MIDDLE FINGER 2+V  COMPARISON:  04/03/2015  FINDINGS: There several areas of bone destruction or avulsion at the tuft of the distal phalanx. There is also a new area of cortical destruction of the volar aspect of the shaft of the distal phalanx as well as another area of cortical lucency at the dorsal aspect of the base of the distal phalanx. The  findings are suggestive of osteomyelitis. The even with benefit of retrospection, there is no visible fracture on the prior radiograph.  IMPRESSION: Multiple areas of bone destruction of the distal phalanx of the right middle finger consistent with osteomyelitis.   Electronically Signed   By: Francene Boyers M.D.   On: 04/13/2015 14:43    Positive ROS: All other systems have been reviewed and were otherwise negative with the exception of those mentioned in the HPI and as above.  Physical Exam: Vitals: Refer to EMR. Constitutional:  WD, WN, NAD HEENT:  NCAT, EOMI Neuro/Psych:  Alert & oriented to person, place, and time; appropriate mood & affect Lymphatic: No generalized extremity edema or  lymphadenopathy Extremities / MSK:  The extremities are normal with respect to appearance, ranges of motion, joint stability, muscle strength/tone, sensation, & perfusion except as otherwise noted:  The right long finger is swollen at the pulp. It is tense, and the skin may be a little thickened there. The digit is held in a flexed posture although there is no significant swelling proximal to the PIP joint. There is pain when attempting to passively or actively extend the digit. There is also some drainage from the paronychia region and what may be some pus under the epidermis at that level as well.  Assessment: Right long finger infection, likely paronychia with perhaps a felon as well and early flexor tenosynovitis  Plan: I discussed these findings with her and indicated that she had a fairly significant problem that will require weeks of treatment in order to do as well as it can be expected to do. I indicated that as a Hydrographic surveyor, I would play an initial role today by draining pus from all deep spaces and "debulking" the bacterial load.  After that, my role will be largely peripheral as an adjunct, ensuring appropriate healing of the wounds that I create and ultimately eventually overseeing aspects of her functional recovery such as hand therapy, etc.  Further, we discussed that resolution of this problem is largely related to her antibiotic therapy (, route, duration, etc. ) and her immune function, with which I am largely not involved.  Questions were invited and answered, and consent obtained to proceed with surgical drainage/debridement tonight once cleared by anesthesia to proceed with surgery.  Cliffton Asters Janee Morn, MD      Orthopaedic & Hand Surgery [facility] Orthopaedic [facility] Sports Medicine [facility] [address] [place], [place]  [address] Office: [phone] Mobile: [phone]

## 2015-04-13 NOTE — ED Provider Notes (Signed)
CSN: [id number]     Arrival date & time 04/13/15  1242 History  This chart was scribed for non-physician practitioner, Lottie Mussel, PA-C, working with Gilda Crease, MD, by Ronney Lion, ED Scribe. This patient was seen in room TR05C/TR05C and the patient's care was started at 2:13 PM.     Chief Complaint  Patient presents with  . finger infection    The history is provided by the patient. No language interpreter was used.    HPI Comments: [name] is a 29 y.o. female who presents to the Emergency Department complaining of constant, worsening, severe right third finger pain and swelling due a crush injury that occurred about 12 days, after slamming it in a sliding door, and acutely worsened this morning with new-onset discharge. She reports the pain in her middle finger runs to the palm of her right hand. Patient was evaluated for this in the ED 10 days ago (04/03/15) and placed in a splint, which she states she has not worn because it caused increasing pain. Patient states she has run warm water on it when it was draining but otherwise notes no treatment. She states she last ate and drank between 11:30 and 12:00 PM today, about 2-3 hours ago. She denies a history of any chronic medical conditions. She denies fever or chills. Patient has NKDA.  Past Medical History  Diagnosis Date  . Abnormal Pap smear   . Anemia   . Mental disorder     hx of pp depression  . STD (female)     chlamydia and trich  . Hx MRSA infection     History only - 2 Negative MRSA swabs in 2012  . SVD (spontaneous vaginal delivery)     x 5  . Depression     no meds    Past Surgical History  Procedure Laterality Date  . Cervical biopsy  w/ loop electrode excision    . Wisdom tooth extraction    . Laparoscopic tubal ligation Bilateral 12/02/2013    Procedure: LAPAROSCOPIC TUBAL LIGATION WITH FILSCHE CLIPS;  Surgeon: Adam Phenix, MD;  Location: [facility];  Service: Gynecology;  Laterality: Bilateral;   FILSCHE CLIPS   Family History  Problem Relation Age of Onset  . Cancer Paternal Grandmother   . Hypertension Maternal Grandmother   . Heart disease Maternal Grandmother   . Diabetes Maternal Grandmother   . Stroke Maternal Grandmother   . Cancer Maternal Grandfather   . Hypertension Mother    Social History  Substance Use Topics  . Smoking status: Never Smoker   . Smokeless tobacco: Never Used  . Alcohol Use: Yes     Comment: Patient reports she drank Mad dog 20/20 on 04/03/14   OB History    Gravida Para Term Preterm AB TAB SAB Ectopic Multiple Living   0 0 0 0 0 1 5     Review of Systems  Constitutional: Negative for fever and chills.  Musculoskeletal: Positive for myalgias (right third finger pain).   Allergies  Review of patient's allergies indicates no known allergies.  Home Medications   Prior to Admission medications   Medication Sig Start Date End Date Taking? Authorizing Provider  naproxen (NAPROSYN) 375 MG tablet Take 1 tablet (375 mg total) by mouth 2 (two) times daily. 04/03/15   Arthor Captain, PA-C  oxyCODONE (OXY IR/ROXICODONE) 5 MG immediate release tablet Take 0.5-1 tablets (2.5-5 mg total) by mouth every 4 (four) hours as needed for  severe pain. 04/03/15   Arthor Captain, PA-C  phenazopyridine (PYRIDIUM) 95 MG tablet Take 1 tablet (95 mg total) by mouth 3 (three) times daily as needed for pain. Patient not taking: Reported on 04/03/2015 11/18/14   Dorathy Kinsman, CNM  sulfamethoxazole-trimethoprim (BACTRIM DS,SEPTRA DS) 800-160 MG per tablet Take 1 tablet by mouth 2 (two) times daily. Patient not taking: Reported on 04/03/2015 11/18/14   Dorathy Kinsman, CNM   BP 123/71 mmHg  Pulse 83  Temp(Src) 98.4 F (36.9 C) (Oral)  Resp 22  Ht 6\' 1"  (1.854 m)  Wt 135 lb (61.236 kg)  BMI 17.82 kg/m2  SpO2 99%  LMP 04/06/2015 Physical Exam  Constitutional: She is oriented to person, place, and time. She appears well-developed and well-nourished. No distress.   HENT:  Head: Normocephalic and atraumatic.  Eyes: Conjunctivae and EOM are normal.  Neck: Neck supple. No tracheal deviation present.  Cardiovascular: Normal rate, regular rhythm and normal heart sounds.   Pulmonary/Chest: Effort normal and breath sounds normal. No respiratory distress. She has no wheezes. She has no rales.  Musculoskeletal: Normal range of motion.  Diffusely swollen right middle finger held in flexed position. There is drainage around the finger nail with pus filled blister to the dorsal aspect of the middle phalanx of the middle finger. Pt unable to extend finger passively or actively. Cap refill <2 sec.   Neurological: She is alert and oriented to person, place, and time.  Skin: Skin is warm and dry.  Psychiatric: She has a normal mood and affect. Her behavior is normal.  Nursing note and vitals reviewed.   ED Course  Procedures (including critical care time)  DIAGNOSTIC STUDIES: Oxygen Saturation is 99% on RA, normal by my interpretation.    COORDINATION OF CARE: 2:18 PM - Suspect tenosynovitis. Discussed treatment plan with pt at bedside which includes right hand XR and consult with hand surgeon, with probable surgery to open and drain the right middle finger. Pt verbalized understanding and agreed to plan.   3:51 PM - Hand surgeon Dr. Janee Morn in room to evaluate patient.   Labs Review Labs Reviewed  CBC - Abnormal; Notable for the following:    Hemoglobin 11.8 (*)    HCT 35.9 (*)    All other components within normal limits  BASIC METABOLIC PANEL - Abnormal; Notable for the following:    Potassium 3.3 (*)    All other components within normal limits    Imaging Review Dg Finger Middle Right  04/13/2015   CLINICAL DATA:  Pain and swelling of the distal right middle finger due to a crushing injury 04/01/2015.  EXAM: RIGHT MIDDLE FINGER 2+V  COMPARISON:  04/03/2015  FINDINGS: There several areas of bone destruction or avulsion at the tuft of the distal  phalanx. There is also a new area of cortical destruction of the volar aspect of the shaft of the distal phalanx as well as another area of cortical lucency at the dorsal aspect of the base of the distal phalanx. The findings are suggestive of osteomyelitis. The even with benefit of retrospection, there is no visible fracture on the prior radiograph.  IMPRESSION: Multiple areas of bone destruction of the distal phalanx of the right middle finger consistent with osteomyelitis.   Electronically Signed   By: Francene Boyers M.D.   On: 04/13/2015 14:43   I have personally reviewed and evaluated these images and lab results as part of my medical decision-making.   EKG Interpretation None  MDM   Final diagnoses:  Infection of finger   Pt with right middle finger infection, most likely paronychia as a source, however, unsure if crush injury has anything to do with it or if is just a red herring. 10d ago xray negative. Will repeat xray. pts exam concerning for tenosynovitis. Will get hand surgery involved.   Spoke with Dr. Janee Morn who will take patient to the OR. Asked for medicine to admit.  Discussed case with teaching service, they will admit the patient. No antibiotics started since Dr. Janee Morn will want I and D cultures. Patient's pain controlled with morphine. Labs unremarkable. Patient is nothing by mouth. Last ate at 11:30.  Filed Vitals:   04/13/15 1613  BP: 120/80  Pulse: 69  Temp: 97.4 F (36.3 C)  Resp: 16   I personally performed the services described in this documentation, which was scribed in my presence. The recorded information has been reviewed and is accurate.    Jaynie Crumble, PA-C 04/13/15 1621  Gilda Crease, MD 04/14/15 [phone]

## 2015-04-13 NOTE — Transfer of Care (Signed)
Immediate Anesthesia Transfer of Care Note  Patient: [name]  Procedure(s) Performed: Procedure(s): RIGHT LONG FINGER DEBRIDEMENT AND FLEXOR TENDON SHEATH DRAINAGE (Right)  Patient Location: PACU  Anesthesia Type:General  Level of Consciousness: responds to stimulation  Airway & Oxygen Therapy: Patient Spontanous Breathing  Post-op Assessment: Report given to RN and Post -op Vital signs reviewed and stable  Post vital signs: Reviewed and stable  Last Vitals:  Filed Vitals:   04/13/15 2105  BP:   Pulse:   Temp: 36.5 C  Resp:     Complications: No apparent anesthesia complications

## 2015-04-13 NOTE — ED Notes (Signed)
Last ate at 11:00.

## 2015-04-13 NOTE — ED Notes (Signed)
Patient states was here last week and her middle finger to R hand was broken.  Patient states the finger has gotten infected and is draining some.   Patient states can't move the finger at all.

## 2015-04-13 NOTE — ED Notes (Signed)
Pt's clothing and cell phone in pt belongings bag.

## 2015-04-13 NOTE — H&P (Signed)
Date: 04/13/2015               Patient Name:  [name] MRN: [id number]  DOB: 10-06-1985 Age / Sex: 29 y.o., female   PCP: No Pcp Per Patient         Medical Service: Internal Medicine Teaching Service         Attending Physician: Dr. Annia Belt, MD    First Contact: Dr. Charlynn Grimes Pager: [phone]  Second Contact: Dr. Naaman Plummer Pager: [phone]       After Hours (After 5p/  First Contact Pager: [phone]  weekends / holidays): Second Contact Pager: [phone]   Chief Complaint: Finger infection   History of Present Illness: [name] is a 29 year old african american female with a PMH of menorrhagia with Fe deficiency anemia presents to [facility] ED complaining of finger infection. She was previously seen in the ED on 8/27 following having her right middle finger crushed by a sliding door on 8/25. X-rays done at the time were negative for any fractures. Started on Bactrim. Patient reports that 2-3 days ago the pain began to worsen with pus drainage around the nail. She managed the drainage by cleaning the are with peroxide but has not resolved. Today she reports pain in her right middle digit with worsening swelling. Denies any fevers, chills, erythema, N/V/D or abdominal pain. X-rays done in the ED with multiple areas of bone destruction of the distal phalanx of the right middle finger consistent with osteomyelitis. Ortho was consulted and she will go for surgery to drain pus and for debulking. Holding antibiotics until after surgery for wound cultures. Received Tdap in ED.   Meds: Current Facility-Administered Medications  Medication Dose Route Frequency Provider Last Rate Last Dose  . 0.9 %  sodium chloride infusion   Intravenous STAT Tatyana Kirichenko, PA-C 125 mL/hr at 04/13/15 1838    . [MAR Hold] acetaminophen (TYLENOL) tablet 650 mg  650 mg Oral Q6H PRN Juluis Mire, MD       Or  . Doug Sou Hold] acetaminophen (TYLENOL) suppository 650 mg  650 mg Rectal Q6H PRN Juluis Mire, MD        . Doug Sou Hold] enoxaparin (LOVENOX) injection 40 mg  40 mg Subcutaneous Q24H Marjan Rabbani, MD      . lactated ringers infusion   Intravenous Continuous Oleta Mouse, MD 10 mL/hr at 04/13/15 1719    . [MAR Hold] morphine 4 MG/ML injection 4 mg  4 mg Intravenous Q4H PRN Juluis Mire, MD      . Doug Sou Hold] ondansetron (ZOFRAN) tablet 4 mg  4 mg Oral Q6H PRN Juluis Mire, MD       Or  . Doug Sou Hold] ondansetron (ZOFRAN) injection 4 mg  4 mg Intravenous Q6H PRN Marjan Rabbani, MD      . Doug Sou Hold] senna-docusate (Senokot-S) tablet 1 tablet  1 tablet Oral QHS PRN Juluis Mire, MD        Allergies: Allergies as of 04/13/2015  . (No Known Allergies)   Past Medical History  Diagnosis Date  . Abnormal Pap smear   . Anemia   . Mental disorder     hx of pp depression  . STD (female)     chlamydia and trich  . Hx MRSA infection     History only - 2 Negative MRSA swabs in 2012  . SVD (spontaneous vaginal delivery)     x 5  . Depression     no meds    Past  Surgical History  Procedure Laterality Date  . Cervical biopsy  w/ loop electrode excision    . Wisdom tooth extraction    . Laparoscopic tubal ligation Bilateral 12/02/2013    Procedure: LAPAROSCOPIC TUBAL LIGATION WITH FILSCHE CLIPS;  Surgeon: Woodroe Mode, MD;  Location: [facility];  Service: Gynecology;  Laterality: Bilateral;  FILSCHE CLIPS   Family History  Problem Relation Age of Onset  . Cancer Paternal Grandmother   . Hypertension Maternal Grandmother   . Heart disease Maternal Grandmother   . Diabetes Maternal Grandmother   . Stroke Maternal Grandmother   . Cancer Maternal Grandfather   . Hypertension Mother    Social History   Social History  . Marital Status: Single    Spouse Name: N/A  . Number of Children: N/A  . Years of Education: N/A   Occupational History  . Not on file.   Social History Main Topics  . Smoking status: Never Smoker   . Smokeless tobacco: Never Used  . Alcohol Use: Yes      Comment: Patient reports she drank Mad dog 20/20 on 04/03/14  . Drug Use: No     Comment: Patient denies   . Sexual Activity: Yes    Birth Control/ Protection: Surgical   Other Topics Concern  . Not on file   Social History Narrative    Review of Systems: Review of Systems  Constitutional: Negative for fever and chills.  Respiratory: Negative for cough and shortness of breath.   Cardiovascular: Negative for chest pain.  Gastrointestinal: Negative for nausea, vomiting, abdominal pain and diarrhea.  Genitourinary: Negative for dysuria.  Neurological: Negative for dizziness and headaches.   Physical Exam: Blood pressure 120/80, pulse 69, temperature 97.4 F (36.3 C), temperature source Oral, resp. rate 16, height 6' 1"  (1.854 m), weight 135 lb (61.236 kg), last menstrual period 04/06/2015, SpO2 100 %. GENERAL- alert, co-operative, appears as stated age, not in any distress. HEENT- Atraumatic, normocephalic, PERRL, EOMI, oral mucosa appears moist CARDIAC- RRR, no murmurs, rubs or gallops RESP- Moving equal volumes of air, and clear to auscultation bilaterally, no wheezes or crackles. ABDOMEN- Soft, nontender, no guarding or rebound, bowel sounds present. NEURO- No obvious Cr N abnormality EXTREMITIES- pulse 2+, symmetric, no pedal edema.  MSK - Right middle finger markedly swollen and tense with some discoloration. Unable to extend the finger 2/2 pain, fixed in flexed position. Minimal drainage from underneath the nail.  SKIN- Warm, dry, No rash or lesion. PSYCH- Normal mood and affect, appropriate thought content and speech.     Lab results: Basic Metabolic Panel:  Recent Labs  04/13/15 1314  NA 136  K 3.3*  CL 103  CO2 28  GLUCOSE 90  BUN 12  CREATININE 0.90  CALCIUM 9.3   CBC:  Recent Labs  04/13/15 1314  WBC 5.2  HGB 11.8*  HCT 35.9*  MCV 89.1  PLT 259   Urine Drug Screen: Drugs of Abuse     Component Value Date/Time   LABOPIA NONE DETECTED  04/04/2014 1427   COCAINSCRNUR NONE DETECTED 04/04/2014 1427   LABBENZ NONE DETECTED 04/04/2014 1427   AMPHETMU NONE DETECTED 04/04/2014 1427   THCU POSITIVE* 04/04/2014 1427   LABBARB NONE DETECTED 04/04/2014 1427    Imaging results:  Dg Finger Middle Right  04/13/2015   CLINICAL DATA:  Pain and swelling of the distal right middle finger due to a crushing injury 04/01/2015.  EXAM: RIGHT MIDDLE FINGER 2+V  COMPARISON:  04/03/2015  FINDINGS: There  several areas of bone destruction or avulsion at the tuft of the distal phalanx. There is also a new area of cortical destruction of the volar aspect of the shaft of the distal phalanx as well as another area of cortical lucency at the dorsal aspect of the base of the distal phalanx. The findings are suggestive of osteomyelitis. The even with benefit of retrospection, there is no visible fracture on the prior radiograph.  IMPRESSION: Multiple areas of bone destruction of the distal phalanx of the right middle finger consistent with osteomyelitis.   Electronically Signed   By: Lorriane Shire M.D.   On: 04/13/2015 14:43   Assessment & Plan by Problem: Principal Problem:   Infection of finger Active Problems:   Hypokalemia   Chronic blood loss anemia  Infection of Right Long Finger: Most likely of paronychia source that progressed into possible osteomyelitis and early flexor tenosynovitis. Unclear if crush injury has any relation to the infection. Xray concerning for possible osteomyelitis. Will go for surgery tonight. Holding ABX until after surgery for wound cultures. She received Tdap in ED. Afebrile and no elevated WBC. - Start Vanc and Zosyn following wound and blood cultures - F/U wound and blood cultures - Morphine 2 mg IV q4hr prn pain - ESR - MRI of hand to evaluate for osteo - Consider ID consult in AM following results of MRI  - HIV AB  Hypokalemia: K 3.3 on admission. Replete with KCl 40 mEq.   Chronic blood loss anemia 2/2 menorrhagia:  Patient on Fe supplementation at home. Hgb 11.8 on admission.   Dispo: Disposition is deferred at this time, awaiting improvement of current medical problems. Anticipated discharge in approximately 1-3 day(s).   The patient does not have a current PCP (No Pcp Per Patient) and does need an [facility] hospital follow-up appointment after discharge.  The patient does not have transportation limitations that hinder transportation to clinic appointments.  Signed: Maryellen Pile, MD 04/13/2015, 6:43 PM

## 2015-04-13 NOTE — ED Provider Notes (Signed)
Patient presented to the ER with swelling of finger. Patient reports that she injured her right middle finger 10 days ago. She was seen in the ER 2 days after the injury and x-rays were negative. Since then the finger has become more painful and swollen. She reports that has been draining.  Face to face Exam: Hand exam - right middle finger with significant swelling. Finger held in flexion, cannot extend secondary to significant pain. Severe tenderness over the flexor tendons. Open draining area at radial aspect of nail bed.  Plan: Appears to be significant infection. Patient likely started with a paronychia, but now has diffuse cellulitis, abscess of the finger with possible flexor tenosynovitis. Hand surgery consultation will be obtained.  Gilda Crease, MD 04/13/15 1455

## 2015-04-13 NOTE — Progress Notes (Signed)
Report called to Patti, RN

## 2015-04-14 ENCOUNTER — Encounter (HOSPITAL_COMMUNITY): Payer: Self-pay | Admitting: Orthopedic Surgery

## 2015-04-14 ENCOUNTER — Inpatient Hospital Stay (HOSPITAL_COMMUNITY): Payer: Medicaid Other

## 2015-04-14 DIAGNOSIS — M65141 Other infective (teno)synovitis, right hand: Secondary | ICD-10-CM

## 2015-04-14 DIAGNOSIS — D500 Iron deficiency anemia secondary to blood loss (chronic): Secondary | ICD-10-CM

## 2015-04-14 DIAGNOSIS — L089 Local infection of the skin and subcutaneous tissue, unspecified: Secondary | ICD-10-CM

## 2015-04-14 DIAGNOSIS — N920 Excessive and frequent menstruation with regular cycle: Secondary | ICD-10-CM

## 2015-04-14 DIAGNOSIS — B9689 Other specified bacterial agents as the cause of diseases classified elsewhere: Secondary | ICD-10-CM

## 2015-04-14 DIAGNOSIS — E876 Hypokalemia: Secondary | ICD-10-CM

## 2015-04-14 DIAGNOSIS — Z9889 Other specified postprocedural states: Secondary | ICD-10-CM

## 2015-04-14 DIAGNOSIS — M86141 Other acute osteomyelitis, right hand: Secondary | ICD-10-CM

## 2015-04-14 DIAGNOSIS — M869 Osteomyelitis, unspecified: Secondary | ICD-10-CM | POA: Diagnosis present

## 2015-04-14 LAB — COMPREHENSIVE METABOLIC PANEL
ALBUMIN: 3.1 g/dL — AB (ref 3.5–5.0)
ALK PHOS: 51 U/L (ref 38–126)
ALT: 13 U/L — ABNORMAL LOW (ref 14–54)
ANION GAP: 7 (ref 5–15)
AST: 14 U/L — ABNORMAL LOW (ref 15–41)
BILIRUBIN TOTAL: 0.8 mg/dL (ref 0.3–1.2)
BUN: 6 mg/dL (ref 6–20)
CALCIUM: 8.3 mg/dL — AB (ref 8.9–10.3)
CO2: 26 mmol/L (ref 22–32)
CREATININE: 1.02 mg/dL — AB (ref 0.44–1.00)
Chloride: 102 mmol/L (ref 101–111)
GFR calc Af Amer: >60 mL/min (ref >60)
GFR calc non Af Amer: >60 mL/min (ref >60)
GLUCOSE: 111 mg/dL — AB (ref 65–99)
Potassium: 3.6 mmol/L (ref 3.5–5.1)
Sodium: 135 mmol/L (ref 135–145)
TOTAL PROTEIN: 6.3 g/dL — AB (ref 6.5–8.1)

## 2015-04-14 LAB — CBC
HCT: 32.8 % — ABNORMAL LOW (ref 36.0–46.0)
HEMOGLOBIN: 10.6 g/dL — AB (ref 12.0–15.0)
MCH: 28.6 pg (ref 26.0–34.0)
MCHC: 32.3 g/dL (ref 30.0–36.0)
MCV: 88.4 fL (ref 78.0–100.0)
PLATELETS: 222 10*3/uL (ref 150–400)
RBC: 3.71 MIL/uL — ABNORMAL LOW (ref 3.87–5.11)
RDW: 11.7 % (ref 11.5–15.5)
WBC: 7.3 10*3/uL (ref 4.0–10.5)

## 2015-04-14 LAB — HIV ANTIBODY (ROUTINE TESTING W REFLEX): HIV Screen 4th Generation wRfx: NONREACTIVE

## 2015-04-14 LAB — POCT PREGNANCY, URINE: PREG TEST UR: NEGATIVE

## 2015-04-14 MED ORDER — VANCOMYCIN HCL IN DEXTROSE 750-5 MG/150ML-% IV SOLN
750.0000 mg | Freq: Three times a day (TID) | INTRAVENOUS | Status: DC
  Administered 2015-04-14 (×2): 750 mg via INTRAVENOUS
  Filled 2015-04-14 (×3): qty 150

## 2015-04-14 MED ORDER — PROMETHAZINE HCL 25 MG/ML IJ SOLN
12.5000 mg | Freq: Four times a day (QID) | INTRAMUSCULAR | Status: DC | PRN
  Administered 2015-04-14: 12.5 mg via INTRAVENOUS
  Filled 2015-04-14: qty 1

## 2015-04-14 NOTE — Consult Note (Signed)
[facility]  Date of Admission:  04/13/2015  Date of Consult:  04/14/2015  Reason for Consult: Septic flexor tenosynovitis R 3rd digit, osteomyelitis Referring Physician: Beryle Beams  Impression/Recommendation Septic flexor tenosynovitis R 3rd digit, osteomyelitis Anemia due to menorrhagia  Would Continue vanco/zosyn for now.  Await her Cx.  Await her HIV testing Got Tetanus shot this AM.  She will need prolonged IV therapy due to osteo.  Discuss with pt need for home IV , PIC line.   Thank you so much for this interesting consult,   Bobby Rumpf (pager) [phone] [email or web address]  [name] is an 29 y.o. female.  HPI: 29 yo F with hx of anemia and of slamming her R 3rd finger in a door on 8-25. She developed swelling and d/c from the nail bed. She presented to the ED on 9-6 with worsening drainage, pain and swelling.   She had a MRI on 9-7 which showed: 1. Osteomyelitis of the terminal phalanx of the long finger. There is diffuse bone marrow edema throughout the terminal phalanx however the infection has likely originated in the finger tuft dorsally. 2. Diffuse edema/cellulitis of the long finger. 3. No convincing evidence of septic arthritis of the DIP joint. She was taken to OR and had I & D, felt to have septic flexor tenosynovitis. Post-operatively she was started on vanco/zosyn.   Past Medical History  Diagnosis Date  . Abnormal Pap smear   . STD (female)     chlamydia and trich  . Hx MRSA infection     History only - 2 Negative MRSA swabs in 2012  . SVD (spontaneous vaginal delivery)     x 5  . Depression     no meds   . Post-partum depression     hx  . Iron deficiency anemia     Past Surgical History  Procedure Laterality Date  . Cervical biopsy  w/ loop electrode excision    . Wisdom tooth extraction    . Laparoscopic tubal ligation Bilateral 12/02/2013    Procedure: LAPAROSCOPIC TUBAL LIGATION WITH FILSCHE  CLIPS;  Surgeon: Woodroe Mode, MD;  Location: [facility];  Service: Gynecology;  Laterality: Bilateral;  FILSCHE CLIPS  . Incision and drainage Right 04/13/2015    long finger excisional debridement of skin and subcutaneous tissues, with irrigation/drainage of the flexor tendon sheath, and removal of nail plate  . Tubal ligation Bilateral      No Known Allergies  Medications:  Scheduled: . meloxicam  15 mg Oral Daily  . piperacillin-tazobactam (ZOSYN)  IV  3.375 g Intravenous 3 times per day  . vancomycin  750 mg Intravenous Q8H    Abtx:  Anti-infectives    Start     Dose/Rate Route Frequency Ordered Stop   04/14/15 1430  vancomycin (VANCOCIN) IVPB 750 mg/150 ml premix     750 mg 150 mL/hr over 60 Minutes Intravenous Every 8 hours 04/14/15 1117     04/13/15 2215  piperacillin-tazobactam (ZOSYN) IVPB 3.375 g     3.375 g 12.5 mL/hr over 240 Minutes Intravenous 3 times per day 04/13/15 2214     04/13/15 2215  vancomycin (VANCOCIN) IVPB 750 mg/150 ml premix  Status:  Discontinued     750 mg 150 mL/hr over 60 Minutes Intravenous Every 8 hours 04/13/15 2214 04/14/15 1117      Total days of antibiotics: 2 vanco/zosyn          Social History:  reports that she has  never smoked. She has never used smokeless tobacco. She reports that she drinks alcohol. She reports that she does not use illicit drugs.  Family History  Problem Relation Age of Onset  . Cancer Paternal Grandmother   . Hypertension Maternal Grandmother   . Heart disease Maternal Grandmother   . Diabetes Maternal Grandmother   . Stroke Maternal Grandmother   . Cancer Maternal Grandfather   . Hypertension Mother     General ROS: no wt loss, n/v since surgery, no headaches, no SOB, no cough, no BM since surgery, normal urine, no f/c. had chills at home. no proximal streking. no axillary LN. see HPI, 12 point ROS o/w (-)  Blood pressure 117/63, pulse 79, temperature 98.5 F (36.9 C), temperature source Oral, resp. rate 18,  height 6' 1"  (1.854 m), weight 61.236 kg (135 lb), last menstrual period 04/06/2015, SpO2 100 %. General appearance: alert, cooperative and no distress Eyes: negative findings: conjunctivae and sclerae normal and pupils equal, round, reactive to light and accomodation Throat: lips, mucosa, and tongue normal; teeth and gums normal Neck: no adenopathy and supple, symmetrical, trachea midline Lungs: clear to auscultation bilaterally Heart: regular rate and rhythm Abdomen: normal findings: bowel sounds normal and soft, non-tender Lymph nodes: Cervical adenopathy: none and Axillary adenopathy: none on R  Extr- R 3rd finger wrapped. No proximal swelling or erythema. No LE edema.    Results for orders placed or performed during the hospital encounter of 04/13/15 (from the past 48 hour(s))  CBC     Status: Abnormal   Collection Time: 04/13/15  1:14 PM  Result Value Ref Range   WBC 5.2 4.0 - 10.5 K/uL   RBC 4.03 3.87 - 5.11 MIL/uL   Hemoglobin 11.8 (L) 12.0 - 15.0 g/dL   HCT 35.9 (L) 36.0 - 46.0 %   MCV 89.1 78.0 - 100.0 fL   MCH 29.3 26.0 - 34.0 pg   MCHC 32.9 30.0 - 36.0 g/dL   RDW 11.8 11.5 - 15.5 %   Platelets 259 150 - 400 K/uL  Basic metabolic panel     Status: Abnormal   Collection Time: 04/13/15  1:14 PM  Result Value Ref Range   Sodium 136 135 - 145 mmol/L   Potassium 3.3 (L) 3.5 - 5.1 mmol/L   Chloride 103 101 - 111 mmol/L   CO2 28 22 - 32 mmol/L   Glucose, Bld 90 65 - 99 mg/dL   BUN 12 6 - 20 mg/dL   Creatinine, Ser 0.90 0.44 - 1.00 mg/dL   Calcium 9.3 8.9 - 10.3 mg/dL   GFR calc non Af Amer >60 >60 mL/min   GFR calc Af Amer >60 >60 mL/min    Comment: (NOTE) The eGFR has been calculated using the CKD EPI equation. This calculation has not been validated in all clinical situations. eGFR's persistently <60 mL/min signify possible Chronic Kidney Disease.    Anion gap 5 5 - 15  Pregnancy, urine POC     Status: None   Collection Time: 04/13/15  5:36 PM  Result Value Ref  Range   Preg Test, Ur NEGATIVE NEGATIVE    Comment:        THE SENSITIVITY OF THIS METHODOLOGY IS >24 mIU/mL   Surgical pcr screen     Status: None   Collection Time: 04/13/15  6:35 PM  Result Value Ref Range   MRSA, PCR NEGATIVE NEGATIVE   Staphylococcus aureus NEGATIVE NEGATIVE    Comment:  The Xpert SA Assay (FDA approved for NASAL specimens in patients over 51 years of age), is one component of a comprehensive surveillance program.  Test performance has been validated by [facility] for patients greater than or equal to 46 year old. It is not intended to diagnose infection nor to guide or monitor treatment.   Culture, blood (routine x 2)     Status: None (Preliminary result)   Collection Time: 04/13/15  6:41 PM  Result Value Ref Range   Specimen Description BLOOD RIGHT HAND    Special Requests IN PEDIATRIC BOTTLE 4CC    Culture NO GROWTH < 24 HOURS    Report Status PENDING   Sedimentation rate     Status: Abnormal   Collection Time: 04/13/15  6:41 PM  Result Value Ref Range   Sed Rate 28 (H) 0 - 22 mm/hr  HIV antibody     Status: None   Collection Time: 04/13/15  6:41 PM  Result Value Ref Range   HIV Screen 4th Generation wRfx Non Reactive Non Reactive    Comment: (NOTE) Performed At: [facility] [place], [place] [id number] Lindon Romp MD PT:[id number]   Magnesium     Status: None   Collection Time: 04/13/15  6:41 PM  Result Value Ref Range   Magnesium 1.9 1.7 - 2.4 mg/dL  Culture, blood (routine x 2)     Status: None (Preliminary result)   Collection Time: 04/13/15  6:46 PM  Result Value Ref Range   Specimen Description BLOOD RIGHT ANTECUBITAL    Special Requests IN PEDIATRIC BOTTLE 4CC    Culture NO GROWTH < 24 HOURS    Report Status PENDING   Comprehensive metabolic panel     Status: Abnormal   Collection Time: 04/14/15  4:10 AM  Result Value Ref Range   Sodium 135 135 - 145 mmol/L   Potassium 3.6 3.5 - 5.1 mmol/L    Chloride 102 101 - 111 mmol/L   CO2 26 22 - 32 mmol/L   Glucose, Bld 111 (H) 65 - 99 mg/dL   BUN 6 6 - 20 mg/dL   Creatinine, Ser 1.02 (H) 0.44 - 1.00 mg/dL   Calcium 8.3 (L) 8.9 - 10.3 mg/dL   Total Protein 6.3 (L) 6.5 - 8.1 g/dL   Albumin 3.1 (L) 3.5 - 5.0 g/dL   AST 14 (L) 15 - 41 U/L   ALT 13 (L) 14 - 54 U/L   Alkaline Phosphatase 51 38 - 126 U/L   Total Bilirubin 0.8 0.3 - 1.2 mg/dL   GFR calc non Af Amer >60 >60 mL/min   GFR calc Af Amer >60 >60 mL/min    Comment: (NOTE) The eGFR has been calculated using the CKD EPI equation. This calculation has not been validated in all clinical situations. eGFR's persistently <60 mL/min signify possible Chronic Kidney Disease.    Anion gap 7 5 - 15  CBC     Status: Abnormal   Collection Time: 04/14/15  4:10 AM  Result Value Ref Range   WBC 7.3 4.0 - 10.5 K/uL   RBC 3.71 (L) 3.87 - 5.11 MIL/uL   Hemoglobin 10.6 (L) 12.0 - 15.0 g/dL   HCT 32.8 (L) 36.0 - 46.0 %   MCV 88.4 78.0 - 100.0 fL   MCH 28.6 26.0 - 34.0 pg   MCHC 32.3 30.0 - 36.0 g/dL   RDW 11.7 11.5 - 15.5 %   Platelets 222 150 - 400 K/uL  Component Value Date/Time   SDES BLOOD RIGHT ANTECUBITAL 04/13/2015 1846   SPECREQUEST IN PEDIATRIC BOTTLE 4CC 04/13/2015 1846   CULT NO GROWTH < 24 HOURS 04/13/2015 1846   REPTSTATUS PENDING 04/13/2015 1846   Mr Hand Right Wo Contrast  04/14/2015   CLINICAL DATA:  Long finger pain and swelling. Finger infection. Incisional debridement 04/13/2015.  EXAM: MRI OF THE RIGHT HAND WITHOUT CONTRAST  TECHNIQUE: Multiplanar, multisequence MR imaging was performed. No intravenous contrast was administered.  COMPARISON:  04/13/2015.  04/03/2015.  FINDINGS: Osteomyelitis of terminal phalanx of the long finger is present. Diffuse edema at a extends proximally from the finger tip to the third metacarpal. This may be postoperative and/or infectious. Third finger is markedly swollen.  There is low signal material volar to the DIP joint of the long  finger. This overlies the insertion of the flexor tendons on the terminal phalanx. The differential considerations are clot, necrotic tissue or packing material.  No convincing evidence of DIP joint septic arthritis and bone marrow signal in the distal aspect of the middle phalanx appears normal. The other visible rays appear normal.  IMPRESSION: 1. Osteomyelitis of the terminal phalanx of the long finger. There is diffuse bone marrow edema throughout the terminal phalanx however the infection has likely originated in the finger tuft dorsally. 2. Diffuse edema/cellulitis of the long finger. 3. No convincing evidence of septic arthritis of the DIP joint.   Electronically Signed   By: Dereck Ligas M.D.   On: 04/14/2015 11:53   Dg Finger Middle Right  04/13/2015   CLINICAL DATA:  Pain and swelling of the distal right middle finger due to a crushing injury 04/01/2015.  EXAM: RIGHT MIDDLE FINGER 2+V  COMPARISON:  04/03/2015  FINDINGS: There several areas of bone destruction or avulsion at the tuft of the distal phalanx. There is also a new area of cortical destruction of the volar aspect of the shaft of the distal phalanx as well as another area of cortical lucency at the dorsal aspect of the base of the distal phalanx. The findings are suggestive of osteomyelitis. The even with benefit of retrospection, there is no visible fracture on the prior radiograph.  IMPRESSION: Multiple areas of bone destruction of the distal phalanx of the right middle finger consistent with osteomyelitis.   Electronically Signed   By: Lorriane Shire M.D.   On: 04/13/2015 14:43   Recent Results (from the past 240 hour(s))  Surgical pcr screen     Status: None   Collection Time: 04/13/15  6:35 PM  Result Value Ref Range Status   MRSA, PCR NEGATIVE NEGATIVE Final   Staphylococcus aureus NEGATIVE NEGATIVE Final    Comment:        The Xpert SA Assay (FDA approved for NASAL specimens in patients over 4 years of age), is one  component of a comprehensive surveillance program.  Test performance has been validated by [facility] for patients greater than or equal to 6 year old. It is not intended to diagnose infection nor to guide or monitor treatment.   Culture, blood (routine x 2)     Status: None (Preliminary result)   Collection Time: 04/13/15  6:41 PM  Result Value Ref Range Status   Specimen Description BLOOD RIGHT HAND  Final   Special Requests IN PEDIATRIC BOTTLE 4CC  Final   Culture NO GROWTH < 24 HOURS  Final   Report Status PENDING  Incomplete  Culture, blood (routine x 2)     Status:  None (Preliminary result)   Collection Time: 04/13/15  6:46 PM  Result Value Ref Range Status   Specimen Description BLOOD RIGHT ANTECUBITAL  Final   Special Requests IN PEDIATRIC BOTTLE 4CC  Final   Culture NO GROWTH < 24 HOURS  Final   Report Status PENDING  Incomplete      04/14/2015, 3:56 PM     LOS: 1 day    Records and images were personally reviewed where available.

## 2015-04-14 NOTE — Progress Notes (Signed)
The patient is alert and responsive this morning. She states that she is somewhat dizzy when she sits up and she is encouraged to contact nursing staff prior to leaving  bed to avoid falls. She is also encouraged to continue to elevate her hand to help with swelling.   Exposed skin about the bandage appears normal, without redness or streaking. Patient wearing SCDs and is receiving IV Vanc and Zosyn.  Continue per existing plan.  Britt Boozer, MS ATC OPA-C [facility]

## 2015-04-14 NOTE — Care Management Note (Signed)
Case Management Note  Patient Details  Name: [name] MRN: [id number] Date of Birth: November 20, 1985  Subjective/Objective:                    Action/Plan:  Ur completed. MD to change dressing Thursday  Expected Discharge Date:                  Expected Discharge Plan:  Home/Self Care  In-House Referral:     Discharge planning Services     Post Acute Care Choice:    Choice offered to:     DME Arranged:    DME Agency:     HH Arranged:    HH Agency:     Status of Service:  In process, will continue to follow  Medicare Important Message Given:    Date Medicare IM Given:    Medicare IM give by:    Date Additional Medicare IM Given:    Additional Medicare Important Message give by:     If discussed at Long Length of Stay Meetings, dates discussed:    Additional Comments:  Kingsley Plan, RN 04/14/2015, 7:57 AM

## 2015-04-14 NOTE — Progress Notes (Signed)
Subjective: Patient doing well this morning. Reports moderate pain in hand this morning but otherwise no complaints.   Objective: Vital signs in last 24 hours: Filed Vitals:   04/13/15 2145 04/13/15 2209 04/14/15 0217 04/14/15 0505  BP:  128/86 119/74 120/64  Pulse: 72 72 88 78  Temp: 98.8 F (37.1 C) 98.7 F (37.1 C) 97.6 F (36.4 C) 98.4 F (36.9 C)  TempSrc:  Oral Oral Oral  Resp: 16 17 18 18   Height:      Weight:      SpO2: 99% 100% 100% 100%   Weight change:   Intake/Output Summary (Last 24 hours) at 04/14/15 1025 Last data filed at 04/14/15 0936  Gross per 24 hour  Intake   1220 ml  Output   1025 ml  Net    195 ml   GENERAL- alert, co-operative, appears as stated age, not in any distress. HEENT- Atraumatic, normocephalic, PERRL, EOMI, oral mucosa appears moist CARDIAC- RRR, no murmurs, rubs or gallops RESP- Moving equal volumes of air, and clear to auscultation bilaterally, no wheezes or crackles. ABDOMEN- Soft, nontender, no guarding or rebound, bowel sounds present. NEURO- No obvious Cr N abnormality EXTREMITIES- pulse 2+, symmetric, no pedal edema.  MSK - Right hand wrapped in a cast.  SKIN- Warm, dry, No rash or lesion. PSYCH- Normal mood and affect, appropriate thought content and speech.  Lab Results: Basic Metabolic Panel:  Recent Labs Lab 04/13/15 1314 04/13/15 1841 04/14/15 0410  NA 136  --  135  K 3.3*  --  3.6  CL 103  --  102  CO2 28  --  26  GLUCOSE 90  --  111*  BUN 12  --  6  CREATININE 0.90  --  1.02*  CALCIUM 9.3  --  8.3*  MG  --  1.9  --    Liver Function Tests:  Recent Labs Lab 04/14/15 0410  AST 14*  ALT 13*  ALKPHOS 51  BILITOT 0.8  PROT 6.3*  ALBUMIN 3.1*   CBC:  Recent Labs Lab 04/13/15 1314 04/14/15 0410  WBC 5.2 7.3  HGB 11.8* 10.6*  HCT 35.9* 32.8*  MCV 89.1 88.4  PLT 259 222   Urine Drug Screen: Drugs of Abuse     Component Value Date/Time   LABOPIA NONE DETECTED 04/04/2014 1427   COCAINSCRNUR NONE DETECTED 04/04/2014 1427   LABBENZ NONE DETECTED 04/04/2014 1427   AMPHETMU NONE DETECTED 04/04/2014 1427   THCU POSITIVE* 04/04/2014 1427   LABBARB NONE DETECTED 04/04/2014 1427    Micro Results: Recent Results (from the past 240 hour(s))  Surgical pcr screen     Status: None   Collection Time: 04/13/15  6:35 PM  Result Value Ref Range Status   MRSA, PCR NEGATIVE NEGATIVE Final   Staphylococcus aureus NEGATIVE NEGATIVE Final    Comment:        The Xpert SA Assay (FDA approved for NASAL specimens in patients over 61 years of age), is one component of a comprehensive surveillance program.  Test performance has been validated by [facility] for patients greater than or equal to 57 year old. It is not intended to diagnose infection nor to guide or monitor treatment.    Studies/Results: Dg Finger Middle Right  04/13/2015   CLINICAL DATA:  Pain and swelling of the distal right middle finger due to a crushing injury 04/01/2015.  EXAM: RIGHT MIDDLE FINGER 2+V  COMPARISON:  04/03/2015  FINDINGS: There several areas of bone destruction or  avulsion at the tuft of the distal phalanx. There is also a new area of cortical destruction of the volar aspect of the shaft of the distal phalanx as well as another area of cortical lucency at the dorsal aspect of the base of the distal phalanx. The findings are suggestive of osteomyelitis. The even with benefit of retrospection, there is no visible fracture on the prior radiograph.  IMPRESSION: Multiple areas of bone destruction of the distal phalanx of the right middle finger consistent with osteomyelitis.   Electronically Signed   By: Lorriane Shire M.D.   On: 04/13/2015 14:43   Medications: I have reviewed the patient's current medications. Scheduled Meds: . Influenza vac split quadrivalent PF  0.5 mL Intramuscular Tomorrow-1000  . meloxicam  15 mg Oral Daily  . piperacillin-tazobactam (ZOSYN)  IV  3.375 g Intravenous 3 times per day   . vancomycin  750 mg Intravenous Q8H   Continuous Infusions: . lactated ringers 10 mL/hr at 04/13/15 1719   PRN Meds:.acetaminophen **OR** acetaminophen, HYDROcodone-acetaminophen, morphine, ondansetron **OR** ondansetron (ZOFRAN) IV, oxyCODONE, senna-docusate Assessment/Plan: Principal Problem:   Infection of finger Active Problems:   Hypokalemia   Chronic blood loss anemia  Infection of Right Long Finger: s/p excisional debridement with irrigation/drainage of flexor tendon sheath and removal of nail plate on 9/6. X-ray was concerning for possible osteomyelitis. ESR mildly elevated at 28. Unclear source of infection. Possible paronychia infection that spread. MRI today confirms osteo. - Consulted ID - Continue Vanc and Zosyn - Blood and wound cultures pending - HIV negative - Norco 5-325 mg PO q4hr prn moderate pain - Mobic 15 mg daily - Morphine 2 mg IV q4hr prn severe pain - Leave dressing in place until Thursday per ortho  Hypokalemia: K 3.3 on admission improved to 3.6 today. Repleted with KCl 40 mEq.   Chronic blood loss anemia 2/2 menorrhagia: Patient on Fe supplementation at home. Hgb 11.8 on admission, 10.6 today following surgery. Continue to monitor.    DVT PPx: SCDs  Dispo: Disposition is deferred at this time, awaiting improvement of current medical problems.  Anticipated discharge in approximately 1-3 day(s).   The patient does not have a current PCP (No Pcp Per Patient) and does need an [facility] hospital follow-up appointment after discharge.  The patient does not have transportation limitations that hinder transportation to clinic appointments.  .Services Needed at time of discharge: Y = Yes, Blank = No PT:   OT:   RN:   Equipment:   Other:     LOS: 1 day   Maryellen Pile, MD 04/14/2015, 10:25 AM

## 2015-04-15 DIAGNOSIS — M65841 Other synovitis and tenosynovitis, right hand: Secondary | ICD-10-CM

## 2015-04-15 DIAGNOSIS — M659 Synovitis and tenosynovitis, unspecified: Secondary | ICD-10-CM | POA: Diagnosis present

## 2015-04-15 LAB — VANCOMYCIN, TROUGH: VANCOMYCIN TR: 12 ug/mL (ref 10.0–20.0)

## 2015-04-15 MED ORDER — FERROUS SULFATE 325 (65 FE) MG PO TABS
325.0000 mg | ORAL_TABLET | Freq: Every day | ORAL | Status: DC
  Administered 2015-04-16: 325 mg via ORAL
  Filled 2015-04-15: qty 1

## 2015-04-15 MED ORDER — MELOXICAM 15 MG PO TABS: 15.0000 mg | ORAL_TABLET | Freq: Every day | ORAL | Status: DC

## 2015-04-15 MED ORDER — MORPHINE SULFATE (PF) 2 MG/ML IV SOLN
2.0000 mg | INTRAVENOUS | Status: DC | PRN
  Administered 2015-04-15 – 2015-04-16 (×4): 2 mg via INTRAVENOUS
  Filled 2015-04-15 (×4): qty 1

## 2015-04-15 MED ORDER — VANCOMYCIN HCL IN DEXTROSE 1-5 GM/200ML-% IV SOLN
1000.0000 mg | Freq: Three times a day (TID) | INTRAVENOUS | Status: DC
  Administered 2015-04-15 – 2015-04-16 (×3): 1000 mg via INTRAVENOUS
  Filled 2015-04-15 (×6): qty 200

## 2015-04-15 MED ORDER — VANCOMYCIN HCL IN DEXTROSE 1-5 GM/200ML-% IV SOLN
1000.0000 mg | Freq: Three times a day (TID) | INTRAVENOUS | Status: DC
  Administered 2015-04-15: 1000 mg via INTRAVENOUS
  Filled 2015-04-15 (×2): qty 200

## 2015-04-15 NOTE — Anesthesia Postprocedure Evaluation (Signed)
  Anesthesia Post-op Note  Patient: [name]  Procedure(s) Performed: Procedure(s): RIGHT LONG FINGER DEBRIDEMENT AND FLEXOR TENDON SHEATH DRAINAGE (Right)  Patient Location: PACU  Anesthesia Type:General  Level of Consciousness: awake and alert   Airway and Oxygen Therapy: Patient Spontanous Breathing  Post-op Pain: mild  Post-op Assessment: Post-op Vital signs reviewed              Post-op Vital Signs: stable  Last Vitals:  Filed Vitals:   04/15/15 0536  BP: 111/62  Pulse: 73  Temp: 37 C  Resp: 19    Complications: No apparent anesthesia complications

## 2015-04-15 NOTE — Progress Notes (Signed)
Subjective: Patient complaining of nausea and vomiting this morning. Reports anytime she eats meals she immediately throws it up. Able to tolerate crackers. Not tolerating PO meds. Continues to have pain in her right middle finger.   Objective: Vital signs in last 24 hours: Filed Vitals:   04/14/15 0505 04/14/15 1517 04/14/15 2140 04/15/15 0536  BP: 120/64 117/63 125/63 111/62  Pulse: 78 79 87 73  Temp: 98.4 F (36.9 C) 98.5 F (36.9 C) 96.8 F (36 C) 98.6 F (37 C)  TempSrc: Oral Oral Oral   Resp: 18 18 20 19   Height:      Weight:      SpO2: 100% 100% 99% 100%   Weight change:   Intake/Output Summary (Last 24 hours) at 04/15/15 1139 Last data filed at 04/14/15 1832  Gross per 24 hour  Intake    900 ml  Output      0 ml  Net    900 ml   Physical Exam GENERAL- alert, co-operative, appears as stated age, not in any distress. HEENT- Atraumatic, normocephalic, PERRL, EOMI, oral mucosa appears moist CARDIAC- RRR, no murmurs, rubs or gallops RESP- Moving equal volumes of air, and clear to auscultation bilaterally, no wheezes or crackles. ABDOMEN- Soft, nontender, no guarding or rebound, bowel sounds present. NEURO- No obvious Cr N abnormality EXTREMITIES- pulse 2+, symmetric, no pedal edema.  MSK - Right hand wrapped in dressing. Pain with movement. SKIN- Warm, dry, No rash or lesion. PSYCH- Normal mood and affect, appropriate thought content and speech.  Lab Results: Basic Metabolic Panel:  Recent Labs Lab 04/13/15 1314 04/13/15 1841 04/14/15 0410  NA 136  --  135  K 3.3*  --  3.6  CL 103  --  102  CO2 28  --  26  GLUCOSE 90  --  111*  BUN 12  --  6  CREATININE 0.90  --  1.02*  CALCIUM 9.3  --  8.3*  MG  --  1.9  --    Liver Function Tests:  Recent Labs Lab 04/14/15 0410  AST 14*  ALT 13*  ALKPHOS 51  BILITOT 0.8  PROT 6.3*  ALBUMIN 3.1*   CBC:  Recent Labs Lab 04/13/15 1314 04/14/15 0410  WBC 5.2 7.3  HGB 11.8* 10.6*  HCT 35.9* 32.8*    MCV 89.1 88.4  PLT 259 222   Urine Drug Screen: Drugs of Abuse     Component Value Date/Time   LABOPIA NONE DETECTED 04/04/2014 1427   COCAINSCRNUR NONE DETECTED 04/04/2014 1427   LABBENZ NONE DETECTED 04/04/2014 1427   AMPHETMU NONE DETECTED 04/04/2014 1427   THCU POSITIVE* 04/04/2014 1427   LABBARB NONE DETECTED 04/04/2014 1427    Micro Results: Recent Results (from the past 240 hour(s))  Surgical pcr screen     Status: None   Collection Time: 04/13/15  6:35 PM  Result Value Ref Range Status   MRSA, PCR NEGATIVE NEGATIVE Final   Staphylococcus aureus NEGATIVE NEGATIVE Final    Comment:        The Xpert SA Assay (FDA approved for NASAL specimens in patients over 48 years of age), is one component of a comprehensive surveillance program.  Test performance has been validated by [facility] for patients greater than or equal to 34 year old. It is not intended to diagnose infection nor to guide or monitor treatment.   Culture, blood (routine x 2)     Status: None (Preliminary result)   Collection Time: 04/13/15  6:41 PM  Result Value Ref Range Status   Specimen Description BLOOD RIGHT HAND  Final   Special Requests IN PEDIATRIC BOTTLE 4CC  Final   Culture NO GROWTH < 24 HOURS  Final   Report Status PENDING  Incomplete  Culture, blood (routine x 2)     Status: None (Preliminary result)   Collection Time: 04/13/15  6:46 PM  Result Value Ref Range Status   Specimen Description BLOOD RIGHT ANTECUBITAL  Final   Special Requests IN PEDIATRIC BOTTLE 4CC  Final   Culture NO GROWTH < 24 HOURS  Final   Report Status PENDING  Incomplete  Culture, routine-abscess     Status: None (Preliminary result)   Collection Time: 04/13/15  9:15 PM  Result Value Ref Range Status   Specimen Description ABSCESS  Final   Special Requests RIGHT LONG FINGER  Final   Gram Stain   Final    RARE WBC PRESENT,BOTH PMN AND MONONUCLEAR NO SQUAMOUS EPITHELIAL CELLS SEEN ABUNDANT GRAM POSITIVE  COCCI IN PAIRS IN CHAINS IN CLUSTERS Performed at [facility]    Culture PENDING  Incomplete   Report Status PENDING  Incomplete  Anaerobic culture     Status: None (Preliminary result)   Collection Time: 04/13/15  9:15 PM  Result Value Ref Range Status   Specimen Description ABSCESS  Final   Special Requests RIGHT LONG FINGER  Final   Gram Stain   Final    RARE WBC PRESENT,BOTH PMN AND MONONUCLEAR NO SQUAMOUS EPITHELIAL CELLS SEEN ABUNDANT GRAM POSITIVE COCCI IN PAIRS IN CHAINS IN CLUSTERS Performed at [facility]    Culture   Final    NO ANAEROBES ISOLATED; CULTURE IN PROGRESS FOR 5 DAYS Performed at [facility]    Report Status PENDING  Incomplete   Studies/Results: Mr Hand Right Wo Contrast  04/14/2015   CLINICAL DATA:  Long finger pain and swelling. Finger infection. Incisional debridement 04/13/2015.  EXAM: MRI OF THE RIGHT HAND WITHOUT CONTRAST  TECHNIQUE: Multiplanar, multisequence MR imaging was performed. No intravenous contrast was administered.  COMPARISON:  04/13/2015.  04/03/2015.  FINDINGS: Osteomyelitis of terminal phalanx of the long finger is present. Diffuse edema at a extends proximally from the finger tip to the third metacarpal. This may be postoperative and/or infectious. Third finger is markedly swollen.  There is low signal material volar to the DIP joint of the long finger. This overlies the insertion of the flexor tendons on the terminal phalanx. The differential considerations are clot, necrotic tissue or packing material.  No convincing evidence of DIP joint septic arthritis and bone marrow signal in the distal aspect of the middle phalanx appears normal. The other visible rays appear normal.  IMPRESSION: 1. Osteomyelitis of the terminal phalanx of the long finger. There is diffuse bone marrow edema throughout the terminal phalanx however the infection has likely originated in the finger tuft dorsally. 2. Diffuse edema/cellulitis of the  long finger. 3. No convincing evidence of septic arthritis of the DIP joint.   Electronically Signed   By: Dereck Ligas M.D.   On: 04/14/2015 11:53   Dg Finger Middle Right  04/13/2015   CLINICAL DATA:  Pain and swelling of the distal right middle finger due to a crushing injury 04/01/2015.  EXAM: RIGHT MIDDLE FINGER 2+V  COMPARISON:  04/03/2015  FINDINGS: There several areas of bone destruction or avulsion at the tuft of the distal phalanx. There is also a new area of cortical destruction of the volar  aspect of the shaft of the distal phalanx as well as another area of cortical lucency at the dorsal aspect of the base of the distal phalanx. The findings are suggestive of osteomyelitis. The even with benefit of retrospection, there is no visible fracture on the prior radiograph.  IMPRESSION: Multiple areas of bone destruction of the distal phalanx of the right middle finger consistent with osteomyelitis.   Electronically Signed   By: Lorriane Shire M.D.   On: 04/13/2015 14:43   Medications: I have reviewed the patient's current medications. Scheduled Meds: . piperacillin-tazobactam (ZOSYN)  IV  3.375 g Intravenous 3 times per day   Continuous Infusions: . lactated ringers 10 mL/hr at 04/13/15 1719   PRN Meds:.acetaminophen **OR** acetaminophen, morphine, ondansetron **OR** ondansetron (ZOFRAN) IV, senna-docusate Assessment/Plan: Principal Problem:   Osteomyelitis of finger Active Problems:   Hypokalemia   Chronic blood loss anemia   Tenosynovitis of finger  Infection of Right Long Finger: s/p excisional debridement with irrigation/drainage of flexor tendon sheath and removal of nail plate on 9/6. X-ray was concerning for possible osteomyelitis. ESR mildly elevated at 28. Unclear source of infection. Possible paronychia infection that spread. MRI confirms osteomyelitits.  - Appreciate ID consult - Blood cultures NG at 24 hours - Wound cultures growing abundant G+ cocci in pairs in chains in  clusters - Continue Vanc  - D/C Zosyn  - PICC line today - HIV negative - D/C Mobic 15 mg daily - not helping with pain  - Morphine 2 mg IV q43r prn pain - Follow up with ortho as outpatient in 10-15 days   Hypokalemia: K 3.3 on admission improved to 3.6 yesterday. Repleted with KCl 40 mEq.  - BMET tomorrow AM  Chronic blood loss anemia 2/2 menorrhagia: Patient on Fe supplementation at home. Hgb 11.8 on admission, 10.6 today following surgery.  - Continue to monitor - CBC in AM  - Iron sudies  DVT PPx: SCDs  Dispo: Disposition is deferred at this time, awaiting improvement of current medical problems.  Anticipated discharge in approximately 1-2 day(s).   The patient does not have a current PCP (No Pcp Per Patient) and does need an [facility] hospital follow-up appointment after discharge.  The patient does not have transportation limitations that hinder transportation to clinic appointments.  .Services Needed at time of discharge: Y = Yes, Blank = No PT:   OT:   RN:   Equipment:   Other:     LOS: 2 days   Maryellen Pile, MD 04/15/2015, 11:39 AM

## 2015-04-15 NOTE — Progress Notes (Signed)
ANTIBIOTIC CONSULT NOTE - FOLLOW UP  Pharmacy Consult for Vancomycin / Zosyn Indication: Wound infection / Osteomyelitis  No Known Allergies  Patient Measurements: Height: 6\' 1"  (185.4 cm) Weight: 135 lb (61.236 kg) IBW/kg (Calculated) : 75.4  Vital Signs: Temp: 98.6 F (37 C) (09/08 0536) Temp Source: Oral (09/07 2140) BP: 111/62 mmHg (09/08 0536) Pulse Rate: 73 (09/08 0536) Intake/Output from previous day: 09/07 0701 - 09/08 0700 In: 1140 [P.O.:840; IV Piggyback:300] Out: 600 [Urine:600] Intake/Output from this shift:    Labs:  Recent Labs  04/13/15 1314 04/14/15 0410  WBC 5.2 7.3  HGB 11.8* 10.6*  PLT 259 222  CREATININE 0.90 1.02*   Estimated Creatinine Clearance: 78.6 mL/min (by C-G formula based on Cr of 1.02).  Recent Labs  04/15/15 0613  [facility] 12     Microbiology: Recent Results (from the past 720 hour(s))  Surgical pcr screen     Status: None   Collection Time: 04/13/15  6:35 PM  Result Value Ref Range Status   MRSA, PCR NEGATIVE NEGATIVE Final   Staphylococcus aureus NEGATIVE NEGATIVE Final    Comment:        The Xpert SA Assay (FDA approved for NASAL specimens in patients over 6 years of age), is one component of a comprehensive surveillance program.  Test performance has been validated by [facility] for patients greater than or equal to 59 year old. It is not intended to diagnose infection nor to guide or monitor treatment.   Culture, blood (routine x 2)     Status: None (Preliminary result)   Collection Time: 04/13/15  6:41 PM  Result Value Ref Range Status   Specimen Description BLOOD RIGHT HAND  Final   Special Requests IN PEDIATRIC BOTTLE 4CC  Final   Culture NO GROWTH < 24 HOURS  Final   Report Status PENDING  Incomplete  Culture, blood (routine x 2)     Status: None (Preliminary result)   Collection Time: 04/13/15  6:46 PM  Result Value Ref Range Status   Specimen Description BLOOD RIGHT ANTECUBITAL  Final   Special  Requests IN PEDIATRIC BOTTLE 4CC  Final   Culture NO GROWTH < 24 HOURS  Final   Report Status PENDING  Incomplete    Anti-infectives    Start     Dose/Rate Route Frequency Ordered Stop   04/14/15 1430  vancomycin (VANCOCIN) IVPB 750 mg/150 ml premix     750 mg 150 mL/hr over 60 Minutes Intravenous Every 8 hours 04/14/15 1117     04/13/15 2215  piperacillin-tazobactam (ZOSYN) IVPB 3.375 g     3.375 g 12.5 mL/hr over 240 Minutes Intravenous 3 times per day 04/13/15 2214     04/13/15 2215  vancomycin (VANCOCIN) IVPB 750 mg/150 ml premix  Status:  Discontinued     750 mg 150 mL/hr over 60 Minutes Intravenous Every 8 hours 04/13/15 2214 04/14/15 1117      Assessment: 29 y/o AAF that presents c/o finger infection. Seen in the ED on 8/27 following having her right middle finger crushed by a sliding door on 8/25. Was treated with Bactrim then. X-rays done in the ED with multiple areas of bone destruction of the distal phalanx of the right middle finger consistent with osteomyelitis. S/p I&D of R long finger on 9/6. Now day #3 of abx for osteomyelitis of finger. Afebrile, WBC wnl. SCr 1.02, CrCl ~62ml/min. UOP ok at 0.36ml/kg/hr.  Goal of Therapy:  Vancomycin trough level 15-20 mcg/ml  Resolution of infection  Plan:  Continue Zosyn 3.375g IV q8hr Increase Vancomycin to 1,000mg  IV q8hr Monitor clinical picture, renal function, VT at Css F/U C&S, abx deescalation / LOT  Armandina Stammer 04/15/2015,7:04 AM

## 2015-04-15 NOTE — Progress Notes (Addendum)
R LF swollen, held extended, distal 1/2 purplish packings pulled Nursing will re-dress after pain meds administered (PIV noted just now to be leaking, probably did not get much of  morphine dose)  Begin wound care with BID 20 min warm water soaks Begin OT for ROM ex--will need outpatient continuation upon D/C ( I have entered order for such already)  Infection care per primary service/ID service  F/u with me in 10-15 days for surgical issues (wound healing, motion, etc.)  Neil Crouch, MD [facility] [phone]

## 2015-04-15 NOTE — Evaluation (Signed)
Occupational Therapy Evaluation Patient Details Name: [name] MRN: [id number] DOB: 09/09/1985 Today's Date: 04/15/2015    History of Present Illness Pt is a 29 yo female admitted 2 weeks after child shut her finger in sliding glass door.  Pt with crush injury and now underwent R long finger debridement and flexor tendor sheath drainage.  Pt with unusual development of osteomyelitis.   Clinical Impression   Pt admitted with the above diagnosis and has the deficits listed below. Pt would benefit from continued OT to increase ROM, strength and functional use of the R hand post injury.  Pt will need extensive hand therapy at OP after d/c.      Follow Up Recommendations  Outpatient OT;Supervision - Intermittent    Equipment Recommendations  None recommended by OT    Recommendations for Other Services       Precautions / Restrictions Precautions Precautions: None Restrictions Weight Bearing Restrictions: No Other Position/Activity Restrictions: encouraged R arm to be elevated above heart at all times.      Mobility Bed Mobility Overal bed mobility: Independent                Transfers Overall transfer level: Independent Equipment used: None             General transfer comment: independent.    Balance Overall balance assessment: Independent                                          ADL Overall ADL's : Needs assistance/impaired Eating/Feeding: Set up;Sitting Eating/Feeding Details (indicate cue type and reason): encouraged now to use RUE to feed self. Grooming: Supervision/safety;Standing Grooming Details (indicate cue type and reason): Pt compensates well.  Encouraged pt to start using her R hand to brush teeth/hair etc. Upper Body Bathing: Set up;Sitting   Lower Body Bathing: Set up;Sit to/from stand   Upper Body Dressing : Set up;Sitting Upper Body Dressing Details (indicate cue type and reason): needs assist with buttons and  zippers Lower Body Dressing: Set up;Sit to/from stand Lower Body Dressing Details (indicate cue type and reason): needs assist with zippers and fasteners. Toilet Transfer: Modified Stage manager and Hygiene: Modified independent Toileting - Clothing Manipulation Details (indicate cue type and reason): using L hand to clean self.     Functional mobility during ADLs: Independent General ADL Comments: Pt doing well compensating with LUE for RUE.  Encouraged pt to use RUE for simple light weight tasks like feeding self, grooming etc.     Vision Vision Assessment?: No apparent visual deficits   Perception     Praxis      Pertinent Vitals/Pain Pain Assessment: 0-10 Pain Score: 5  Pain Location: R hand Pain Descriptors / Indicators: Aching;Jabbing;Throbbing;Tender;Tightness;Shooting;Sharp;Pressure Pain Intervention(s): Limited activity within patient's tolerance;Repositioned;Relaxation     Hand Dominance Right   Extremity/Trunk Assessment Upper Extremity Assessment Upper Extremity Assessment: RUE deficits/detail RUE Deficits / Details: PROM WFL for all fingers other than long finger.  Pt tolerated ROM well to all but middle finger.  Middle finger MCP flexion 45 degrees.  MCP extension -20 degrees.  PIP and DIP wrapped and too painful for ROM.  AROM of all fingers except middle in flexion to 1 inch from palm.  Active extension of MCPs to -20 degrees extension. RUE: Unable to fully assess due to pain;Unable to fully assess due to immobilization RUE Coordination: decreased  fine motor   Lower Extremity Assessment Lower Extremity Assessment: Overall WFL for tasks assessed   Cervical / Trunk Assessment Cervical / Trunk Assessment: Normal   Communication Communication Communication: No difficulties   Cognition Arousal/Alertness: Awake/alert Behavior During Therapy: WFL for tasks assessed/performed Overall Cognitive Status: Within Functional Limits  for tasks assessed                     General Comments       Exercises Exercises: General Upper Extremity     Shoulder Instructions      Home Living Family/patient expects to be discharged to:: Private residence Living Arrangements: Spouse/significant other;Children Available Help at Discharge: Family;Friend(s);Available 24 hours/day Type of Home: House                       Home Equipment: None   Additional Comments: Hand injury.  Pt with no mobility issues.      Prior Functioning/Environment Level of Independence: Independent             OT Diagnosis: Acute pain   OT Problem List: Decreased strength;Decreased range of motion;Decreased coordination;Impaired UE functional use;Pain;Increased edema   OT Treatment/Interventions: Therapeutic activities;Self-care/ADL training;Therapeutic exercise    OT Goals(Current goals can be found in the care plan section) Acute Rehab OT Goals Patient Stated Goal: to get my hand working again so I can care for my kids. OT Goal Formulation: With patient Time For Goal Achievement: 04/29/15 Potential to Achieve Goals: Good ADL Goals Pt Will Perform Eating: sitting;with modified independence (using RUE primarily) Pt Will Perform Grooming: with modified independence;standing (using RUE as dominant hand) Pt/caregiver will Perform Home Exercise Program: Increased ROM;Right Upper extremity;Independently Additional ADL Goal #1: Pt will toilet Ily using LUE as needed for hygiene.  OT Frequency: Min 3X/week   Barriers to D/C:            Co-evaluation              End of Session Nurse Communication: Mobility status  Activity Tolerance: Patient tolerated treatment well;Patient limited by pain Patient left: in bed;with call bell/phone within reach   Time: 1233-1300 OT Time Calculation (min): 27 min Charges:  OT General Charges $OT Visit: 1 Procedure OT Evaluation $Initial OT Evaluation Tier I: 1 Procedure OT  Treatments $Therapeutic Exercise: 8-22 mins G-Codes:    Hope Budds 2015-04-24, 1:27 PM [phone]

## 2015-04-15 NOTE — Progress Notes (Signed)
INFECTIOUS DISEASE PROGRESS NOTE  ID: [name] is a 29 y.o. female with  Principal Problem:   Osteomyelitis of finger Active Problems:   Hypokalemia   Chronic blood loss anemia   Tenosynovitis of finger  Subjective: C/o swelling in finger.  Can't extend fully.  No diarrhea. No problems with anbx.   Abtx:  Anti-infectives    Start     Dose/Rate Route Frequency Ordered Stop   04/15/15 1600  vancomycin (VANCOCIN) IVPB 1000 mg/200 mL premix     1,000 mg 200 mL/hr over 60 Minutes Intravenous Every 8 hours 04/15/15 1235     04/15/15 0800  vancomycin (VANCOCIN) IVPB 1000 mg/200 mL premix  Status:  Discontinued     1,000 mg 200 mL/hr over 60 Minutes Intravenous Every 8 hours 04/15/15 0710 04/15/15 1139   04/14/15 1430  vancomycin (VANCOCIN) IVPB 750 mg/150 ml premix  Status:  Discontinued     750 mg 150 mL/hr over 60 Minutes Intravenous Every 8 hours 04/14/15 1117 04/15/15 0710   04/13/15 2215  piperacillin-tazobactam (ZOSYN) IVPB 3.375 g  Status:  Discontinued     3.375 g 12.5 mL/hr over 240 Minutes Intravenous 3 times per day 04/13/15 2214 04/15/15 1227   04/13/15 2215  vancomycin (VANCOCIN) IVPB 750 mg/150 ml premix  Status:  Discontinued     750 mg 150 mL/hr over 60 Minutes Intravenous Every 8 hours 04/13/15 2214 04/14/15 1117      Medications:  Scheduled: . [START ON 04/16/2015] ferrous sulfate  325 mg Oral Q breakfast  . vancomycin  1,000 mg Intravenous Q8H    Objective: Vital signs in last 24 hours: Temp:  [96.8 F (36 C)-98.6 F (37 C)] 98.6 F (37 C) (09/08 0536) Pulse Rate:  [73-87] 73 (09/08 0536) Resp:  [18-20] 19 (09/08 0536) BP: (111-125)/(62-63) 111/62 mmHg (09/08 0536) SpO2:  [99 %-100 %] 100 % (09/08 0536)   General appearance: alert, cooperative and no distress Extremities: finger wrapped, no proximal erythema. unable to extend.   Lab Results  Recent Labs  04/13/15 1314 04/14/15 0410  WBC 5.2 7.3  HGB 11.8* 10.6*  HCT 35.9* 32.8*  NA  136 135  K 3.3* 3.6  CL 103 102  CO2 28 26  BUN 12 6  CREATININE 0.90 1.02*   Liver Panel  Recent Labs  04/14/15 0410  PROT 6.3*  ALBUMIN 3.1*  AST 14*  ALT 13*  ALKPHOS 51  BILITOT 0.8   Sedimentation Rate  Recent Labs  04/13/15 1841  ESRSEDRATE 28*   C-Reactive Protein No results for input(s): CRP in the last 72 hours.  Microbiology: Recent Results (from the past 240 hour(s))  Surgical pcr screen     Status: None   Collection Time: 04/13/15  6:35 PM  Result Value Ref Range Status   MRSA, PCR NEGATIVE NEGATIVE Final   Staphylococcus aureus NEGATIVE NEGATIVE Final    Comment:        The Xpert SA Assay (FDA approved for NASAL specimens in patients over 54 years of age), is one component of a comprehensive surveillance program.  Test performance has been validated by [facility] for patients greater than or equal to 40 year old. It is not intended to diagnose infection nor to guide or monitor treatment.   Culture, blood (routine x 2)     Status: None (Preliminary result)   Collection Time: 04/13/15  6:41 PM  Result Value Ref Range Status   Specimen Description BLOOD RIGHT HAND  Final  Special Requests IN PEDIATRIC BOTTLE 4CC  Final   Culture NO GROWTH 2 DAYS  Final   Report Status PENDING  Incomplete  Culture, blood (routine x 2)     Status: None (Preliminary result)   Collection Time: 04/13/15  6:46 PM  Result Value Ref Range Status   Specimen Description BLOOD RIGHT ANTECUBITAL  Final   Special Requests IN PEDIATRIC BOTTLE 4CC  Final   Culture NO GROWTH 2 DAYS  Final   Report Status PENDING  Incomplete  Culture, routine-abscess     Status: None (Preliminary result)   Collection Time: 04/13/15  9:15 PM  Result Value Ref Range Status   Specimen Description ABSCESS  Final   Special Requests RIGHT LONG FINGER  Final   Gram Stain   Final    RARE WBC PRESENT,BOTH PMN AND MONONUCLEAR NO SQUAMOUS EPITHELIAL CELLS SEEN ABUNDANT GRAM POSITIVE COCCI IN  PAIRS IN CHAINS IN CLUSTERS Performed at [facility]    Culture   Final    Culture reincubated for better growth Performed at [facility]    Report Status PENDING  Incomplete  Anaerobic culture     Status: None (Preliminary result)   Collection Time: 04/13/15  9:15 PM  Result Value Ref Range Status   Specimen Description ABSCESS  Final   Special Requests RIGHT LONG FINGER  Final   Gram Stain   Final    RARE WBC PRESENT,BOTH PMN AND MONONUCLEAR NO SQUAMOUS EPITHELIAL CELLS SEEN ABUNDANT GRAM POSITIVE COCCI IN PAIRS IN CHAINS IN CLUSTERS Performed at [facility]    Culture   Final    NO ANAEROBES ISOLATED; CULTURE IN PROGRESS FOR 5 DAYS Performed at [facility]    Report Status PENDING  Incomplete    Studies/Results: Mr Hand Right Wo Contrast  04/14/2015   CLINICAL DATA:  Long finger pain and swelling. Finger infection. Incisional debridement 04/13/2015.  EXAM: MRI OF THE RIGHT HAND WITHOUT CONTRAST  TECHNIQUE: Multiplanar, multisequence MR imaging was performed. No intravenous contrast was administered.  COMPARISON:  04/13/2015.  04/03/2015.  FINDINGS: Osteomyelitis of terminal phalanx of the long finger is present. Diffuse edema at a extends proximally from the finger tip to the third metacarpal. This may be postoperative and/or infectious. Third finger is markedly swollen.  There is low signal material volar to the DIP joint of the long finger. This overlies the insertion of the flexor tendons on the terminal phalanx. The differential considerations are clot, necrotic tissue or packing material.  No convincing evidence of DIP joint septic arthritis and bone marrow signal in the distal aspect of the middle phalanx appears normal. The other visible rays appear normal.  IMPRESSION: 1. Osteomyelitis of the terminal phalanx of the long finger. There is diffuse bone marrow edema throughout the terminal phalanx however the infection has likely originated in  the finger tuft dorsally. 2. Diffuse edema/cellulitis of the long finger. 3. No convincing evidence of septic arthritis of the DIP joint.   Electronically Signed   By: Andreas Newport M.D.   On: 04/14/2015 11:53   Dg Finger Middle Right  04/13/2015   CLINICAL DATA:  Pain and swelling of the distal right middle finger due to a crushing injury 04/01/2015.  EXAM: RIGHT MIDDLE FINGER 2+V  COMPARISON:  04/03/2015  FINDINGS: There several areas of bone destruction or avulsion at the tuft of the distal phalanx. There is also a new area of cortical destruction of the volar aspect of the shaft of the  distal phalanx as well as another area of cortical lucency at the dorsal aspect of the base of the distal phalanx. The findings are suggestive of osteomyelitis. The even with benefit of retrospection, there is no visible fracture on the prior radiograph.  IMPRESSION: Multiple areas of bone destruction of the distal phalanx of the right middle finger consistent with osteomyelitis.   Electronically Signed   By: Francene Boyers M.D.   On: 04/13/2015 14:43     Assessment/Plan: Septic flexor tenosynovitis R 3rd digit, osteomyelitis Anemia due to menorrhagia  Total days of antibiotics: 3 vanco/zosyn Would stop zosyn Await her Cx 50% of staph here are MRSA  Therapeutic Drug Monitoring vanco trough is therapeutic, sightly low.  Watch Cr         Johny Sax Infectious Diseases (pager) [phone] [email or web address] 04/15/2015, 2:09 PM  LOS: 2 days

## 2015-04-15 NOTE — Care Management Note (Addendum)
Case Management Note  Patient Details  Name: [name] MRN: [id number] Date of Birth: Dec 24, 1985  Subjective/Objective:                    Action/Plan:  Spoke with Dr Karma Greaser , [facility] , appointment at [facility] cancelled. Patient still does not have discharging address.  Spoke with patient and her sister via phone , they are discussing address of where patient will discharge to home . Explained to both address is needed today to arrange home health . Both voiced understanding. Patient's sister stated they will decide today . Patient's sister has NCM phone number .  Patient requiring home health RN for IV antibiotics and outpatient OT.   Checked with [facility] and patient can have both services.   Patient aware home health will show her how to administer IV antibiotics .  Confirmed with patient her cell number [phone] . Patient states she is not discharging to address listed on face sheet . She will call family / friends and ask if she can stay with one of them ( all located in [place] ) . NCM will check with patient early this afternoon to see if she has address .   Patient wanted to go to [facility] for OP OT , called spoke to Vernal who referred to [facility] [phone]. Spoke to Angie who has assess to [facility] and saw order. She is aware patient also having home health RN . She will call patient directly to schedule appointment .   Patient states her PCP is [facility] and is interested in [facility] .   Patient has appointment at [facility] and [facility] , April 23, 2015 at 0900 am .  Expected Discharge Date:                  Expected Discharge Plan:  Home w Home Health Services  In-House Referral:     Discharge planning Services  CM Consult  Post Acute Care Choice:  Home Health Choice offered to:  Patient  DME  Arranged:    DME Agency:     HH Arranged:    HH Agency:  [facility]  Status of Service:  In process, will continue to follow  Medicare Important Message Given:    Date Medicare IM Given:    Medicare IM give by:    Date Additional Medicare IM Given:    Additional Medicare Important Message give by:     If discussed at Long Length of Stay Meetings, dates discussed:    Additional Comments:  Kingsley Plan, RN 04/15/2015, 10:17 AM

## 2015-04-16 DIAGNOSIS — N3000 Acute cystitis without hematuria: Secondary | ICD-10-CM | POA: Insufficient documentation

## 2015-04-16 DIAGNOSIS — B9562 Methicillin resistant Staphylococcus aureus infection as the cause of diseases classified elsewhere: Secondary | ICD-10-CM

## 2015-04-16 DIAGNOSIS — D62 Acute posthemorrhagic anemia: Secondary | ICD-10-CM

## 2015-04-16 LAB — IRON AND TIBC
Iron: 26 ug/dL — ABNORMAL LOW (ref 28–170)
SATURATION RATIOS: 8 % — AB (ref 10.4–31.8)
TIBC: 335 ug/dL (ref 250–450)
UIBC: 309 ug/dL

## 2015-04-16 LAB — BASIC METABOLIC PANEL
Anion gap: 7 (ref 5–15)
BUN: 10 mg/dL (ref 6–20)
CALCIUM: 8.5 mg/dL — AB (ref 8.9–10.3)
CO2: 27 mmol/L (ref 22–32)
CREATININE: 0.87 mg/dL (ref 0.44–1.00)
Chloride: 102 mmol/L (ref 101–111)
GFR calc Af Amer: >60 mL/min (ref >60)
Glucose, Bld: 97 mg/dL (ref 65–99)
Potassium: 3.8 mmol/L (ref 3.5–5.1)
SODIUM: 136 mmol/L (ref 135–145)

## 2015-04-16 LAB — CBC
HCT: 32.4 % — ABNORMAL LOW (ref 36.0–46.0)
Hemoglobin: 10.6 g/dL — ABNORMAL LOW (ref 12.0–15.0)
MCH: 29.3 pg (ref 26.0–34.0)
MCHC: 32.7 g/dL (ref 30.0–36.0)
MCV: 89.5 fL (ref 78.0–100.0)
PLATELETS: 214 10*3/uL (ref 150–400)
RBC: 3.62 MIL/uL — AB (ref 3.87–5.11)
RDW: 12.1 % (ref 11.5–15.5)
WBC: 4.9 10*3/uL (ref 4.0–10.5)

## 2015-04-16 LAB — RETICULOCYTES
RBC.: 3.62 MIL/uL — AB (ref 3.87–5.11)
RETIC COUNT ABSOLUTE: 47.1 10*3/uL (ref 19.0–186.0)
Retic Ct Pct: 1.3 % (ref 0.4–3.1)

## 2015-04-16 LAB — TECHNOLOGIST SMEAR REVIEW

## 2015-04-16 LAB — FERRITIN: FERRITIN: 7 ng/mL — AB (ref 11–307)

## 2015-04-16 MED ORDER — OXYCODONE HCL 5 MG PO TABS: 5.0000 mg | ORAL_TABLET | ORAL | Status: DC | PRN

## 2015-04-16 MED ORDER — MELOXICAM 15 MG PO TABS: 15.0000 mg | ORAL_TABLET | Freq: Every day | ORAL | Status: DC

## 2015-04-16 MED ORDER — FERROUS SULFATE 325 (65 FE) MG PO TABS: 325.0000 mg | ORAL_TABLET | Freq: Every day | ORAL | Status: DC

## 2015-04-16 MED ORDER — HYDROCODONE-ACETAMINOPHEN 5-325 MG PO TABS: 1.0000 | ORAL_TABLET | ORAL | Status: DC | PRN

## 2015-04-16 MED ORDER — HEPARIN SOD (PORK) LOCK FLUSH 100 UNIT/ML IV SOLN
250.0000 [IU] | INTRAVENOUS | Status: AC | PRN
  Administered 2015-04-16: 250 [IU]

## 2015-04-16 MED ORDER — DEXTROSE 5 % IV SOLN
2.0000 g | INTRAVENOUS | Status: DC
  Administered 2015-04-16: 2 g via INTRAVENOUS
  Filled 2015-04-16: qty 2

## 2015-04-16 MED ORDER — DEXTROSE 5 % IV SOLN: 2.0000 g | INTRAVENOUS | Status: DC

## 2015-04-16 MED ORDER — MELOXICAM 7.5 MG PO TABS
15.0000 mg | ORAL_TABLET | Freq: Every day | ORAL | Status: DC
  Administered 2015-04-16: 15 mg via ORAL
  Filled 2015-04-16: qty 2

## 2015-04-16 MED ORDER — SODIUM CHLORIDE 0.9 % IJ SOLN
10.0000 mL | INTRAMUSCULAR | Status: DC | PRN
  Administered 2015-04-16: 10 mL
  Filled 2015-04-16: qty 40

## 2015-04-16 MED ORDER — MORPHINE SULFATE (PF) 2 MG/ML IV SOLN
2.0000 mg | Freq: Once | INTRAVENOUS | Status: AC
  Administered 2015-04-16: 2 mg via INTRAVENOUS
  Filled 2015-04-16: qty 1

## 2015-04-16 MED ORDER — ONDANSETRON HCL 4 MG PO TABS: 4.0000 mg | ORAL_TABLET | Freq: Four times a day (QID) | ORAL | Status: DC | PRN

## 2015-04-16 NOTE — Progress Notes (Signed)
Peripherally Inserted Central Catheter/Midline Placement  The IV Nurse has discussed with the patient and/or persons authorized to consent for the patient, the purpose of this procedure and the potential benefits and risks involved with this procedure.  The benefits include less needle sticks, lab draws from the catheter and patient may be discharged home with the catheter.  Risks include, but not limited to, infection, bleeding, blood clot (thrombus formation), and puncture of an artery; nerve damage and irregular heat beat.  Alternatives to this procedure were also discussed.  PICC/Midline Placement Documentation        [name] 04/16/2015, 8:34 AM

## 2015-04-16 NOTE — Discharge Instructions (Signed)
Continue wound care as shown in the hospital. Continue your hand exercises and make sure that you receive outpatient hand therapy upon discharge.  Follow up with Dr. Grandville Silos in 10-15 day. Call to schedule an appointment. [place] [id number] [phone]  Follow up with our clinic in 2 weeks. We will call you with an appointment.   Bone and Joint Infections Joint infections are called septic or infectious arthritis. An infected joint may damage cartilage and tissue very quickly. This may destroy the joint. Bone infections (osteomyelitis) may last for years. Joints may become stiff if left untreated. Bacteria are the most common cause. Other causes include viruses and fungi, but these are more rare. Bone and joint infections usually come from injury or infection elsewhere in your body; the germs are carried to your bones or joints through the bloodstream.  CAUSES   Blood-carried germs from an infection elsewhere in your body can eventually spread to a bone or joint. The germ staphylococcus is the most common cause of both osteomyelitis and septic arthritis.  An injury can introduce germs into your bones or joints. SYMPTOMS   Weight loss.  Tiredness.  Chills and fever.  Bone or joint pain at rest and with activity.  Tenderness when touching the area or bending the joint.  Refusal to bear weight on a leg or inability to use an arm due to pain.  Decreased range of motion in a joint.  Skin redness, warmth, and tenderness.  Open skin sores and drainage. RISK FACTORS Children, the elderly, and those with weak immune systems are at increased risk of bone and joint infections. It is more common in people with HIV infections and with people on chemotherapy. People are also at increased risk if they have surgery where metal implants are used to stabilize the bone. Plates, screws, or artificial joints provide a surface that bacteria can stick on. Such a growth of bacteria is  called biofilm. The biofilm protects bacteria from antibiotics and bodily defenses. This allows germs to multiply. Other reasons for increased risks include:   Having previous surgery or injury of a bone or joint.  Being on high-dose corticosteroids and immunosuppressive medications that weaken your body's resistance to germs.  Diabetes and long-standing diseases.  Use of intravenous street drugs.  Being on hemodialysis.  Having a history of urinary tract infections.  Removal of your spleen (splenectomy). This weakens your immunity.  Chronic viral infections such as HIV or AIDS.  Lack of sensation such as paraplegia, quadriplegia, or spina bifida. DIAGNOSIS   Increased numbers of white blood cells in your blood may indicate infection. Some times your caregivers are able to identify the infecting germs by testing your blood. Inflammatory markers present in your bloodstream such as an erythrocyte sedimentation rate (ESR or sed rate) or c-reactive protein (CRP) can be indicators of deep infection.  Bone scans and X-ray exams are necessary for diagnosing osteomyelitis. They may help your caregiver find the infected areas. Other studies may give more detailed information. They may help detect fluid collections around a joint, abnormal bone surfaces, or be useful in diagnosing septic arthritis. They can find soft tissue swelling and find excess fluid in an infected joint or the adjacent bone. These tests include:  Ultrasound.  CT (computerized tomography).  MRI (magnetic resonance imaging).  The best test for diagnosing a bone or joint infection is an aspiration or biopsy. Your caregiver will usually use a local anesthetic. He or she can then remove tissue  from a bone injury or use a needle to take fluids from an infected joint. A local anesthetic medication numbs the area to be biopsied. Often biopsies are done in the operating room under general anesthesia. This means you will be asleep  during the procedure. Tests performed on these samples can identify an infection. TREATMENT   Treatment can help control long-standing infections, but infections may come back.  Infections can infect any bone or joint at any age.  Bone and joint infections are rarely fatal.  Bone infection left untreated can become a never-ending infection. It can spread to other areas of your body. It may eventually cause bone death. Reduced limb or joint function can result. In severe cases, this may require removal of a limb. Spinal osteomyelitis is very dangerous. Untreated, it may damage spinal nerves and cause death.  The most common complication of septic arthritis is osteoarthritis with pain and decreased range of motion of the joint. Some forms of treatment may include:  If the infection is caused by bacteria, it is generally treated with antibiotics. You will likely receive the drugs through a vein (intravenously) for anywhere from 2 to 6 weeks. In some cases, especially with children, oral antibiotics following an initial intravenous dose may be effective. The treatment you receive depends on the:  Type of bacteria.  Location of the infection.  Type of surgery that might be done.  Other health conditions or issues you might have.  Your caregiver may drain soft tissue abscesses or pockets of fluid around infected bones or joints. If you have septic arthritis, your caregiver may use a needle to drain pus from the joint on a daily basis. He or she may use an arthroscope to clean the joint or may need to open the joint surgically to remove damaged tissue and infection. An arthroscope is an instrument like a thin lighted telescope. It can be used to look inside the joint.  Surgery is usually needed if the infection has become long-standing. It may also be needed if there is hardware (such as metal plates, screws, or artificial joints) inside the patient. Sometimes a bone or muscle graft is needed to  fill in the open space. This promotes growth of new tissues and better blood flow to the area. PREVENTION   Clean and disinfect wounds quickly to help prevent the start of a bone or joint infection. Get treatment for any infections to prevent spread to a bone or joint.  Do not smoke. Smoking decreases healing rates of bone and predisposes to infection.  When given medications that suppress your immune system, use them according to your caregiver's instructions. Do not take more than prescribed for your condition.  Take good care of your feet and skin, especially if you have diabetes, decreased sensation or circulation problems. SEEK IMMEDIATE MEDICAL CARE IF:   You cannot bear weight on a leg or use an arm, especially following a minor injury. This can be a sign of bone or joint infection.  You think you may have signs or symptoms of a bone or joint infection. Your chance of getting rid of an infection is better if treated early. Document Released: 07/24/2005 Document Revised: 10/16/2011 Document Reviewed: 06/23/2009 [facility] Patient Information 2015 [place], [place]. This information is not intended to replace advice given to you by your health care provider. Make sure you discuss any questions you have with your health care provider.

## 2015-04-16 NOTE — Progress Notes (Addendum)
Mobic noted d/c'd.  Not sure if this was to address ongoing N/V or not.  Mobic was originally prescribed by me not for pain, but to reduce ongoing inflammation, risk for tendon adhesion, etc.  Will likely benefit from some kind of NSAID if she can tolerate it.  Cx polymicrobial, gram stain was abundant GPC  Dressing just changed. Palm and base of digit wounds clean, closed.  Fingertip not dusky.  Digit now held again flexed, movement painful. Begin wound care with BID 20 min warm water soaks  Has begun OT for ROM ex--will need outpatient continuation upon D/C ( I have entered order for such already)  Infection care per primary service/ID service  F/u with me 10-15 days post op  for surgical issues (wound healing, motion, etc.)  Neil Crouch, MD [facility] [phone]

## 2015-04-16 NOTE — Progress Notes (Signed)
[facility]  Patient Status: New pt for [facility] this admission  [facility] is providing the following services: HHRN and [facility] for home IV ABX.  [facility] hospital coordinator will provide in hospital "hands on" teaching with pt regarding IV ABX set up and administration to support independence at home and smooth transition at DC.    If patient discharges after hours, please call [phone].   Sedalia Muta 04/16/2015, 6:37 AM

## 2015-04-16 NOTE — Progress Notes (Signed)
INFECTIOUS DISEASE PROGRESS NOTE  ID: [name] is a 29 y.o. female with  Principal Problem:   Osteomyelitis of finger Active Problems:   Hypokalemia   Chronic blood loss anemia   Tenosynovitis of finger  Subjective: Without complaints  Abtx:  Anti-infectives    Start     Dose/Rate Route Frequency Ordered Stop   04/16/15 1700  cefTRIAXone (ROCEPHIN) 2 g in dextrose 5 % 50 mL IVPB     2 g 100 mL/hr over 30 Minutes Intravenous Every 24 hours 04/16/15 1604     04/16/15 0000  cefTRIAXone 2 g in dextrose 5 % 50 mL     2 g 100 mL/hr over 30 Minutes Intravenous Every 24 hours 04/16/15 1611     04/15/15 1600  vancomycin (VANCOCIN) IVPB 1000 mg/200 mL premix  Status:  Discontinued     1,000 mg 200 mL/hr over 60 Minutes Intravenous Every 8 hours 04/15/15 1235 04/16/15 1604   04/15/15 0800  vancomycin (VANCOCIN) IVPB 1000 mg/200 mL premix  Status:  Discontinued     1,000 mg 200 mL/hr over 60 Minutes Intravenous Every 8 hours 04/15/15 0710 04/15/15 1139   04/14/15 1430  vancomycin (VANCOCIN) IVPB 750 mg/150 ml premix  Status:  Discontinued     750 mg 150 mL/hr over 60 Minutes Intravenous Every 8 hours 04/14/15 1117 04/15/15 0710   04/13/15 2215  piperacillin-tazobactam (ZOSYN) IVPB 3.375 g  Status:  Discontinued     3.375 g 12.5 mL/hr over 240 Minutes Intravenous 3 times per day 04/13/15 2214 04/15/15 1227   04/13/15 2215  vancomycin (VANCOCIN) IVPB 750 mg/150 ml premix  Status:  Discontinued     750 mg 150 mL/hr over 60 Minutes Intravenous Every 8 hours 04/13/15 2214 04/14/15 1117      Medications:  Scheduled: . cefTRIAXone (ROCEPHIN)  IV  2 g Intravenous Q24H  . ferrous sulfate  325 mg Oral Q breakfast  . meloxicam  15 mg Oral Daily    Objective: Vital signs in last 24 hours: Temp:  [98 F (36.7 C)] 98 F (36.7 C) (09/09 1243) Pulse Rate:  [76-77] 77 (09/09 1243) Resp:  [17-20] 18 (09/09 1243) BP: (108-127)/(59-89) 125/89 mmHg (09/09 1243) SpO2:  [99 %-100 %] 99 %  (09/09 1243)   General appearance: alert, cooperative and no distress Extremities: finger tip dressed. no palmar fluctuance, there is tenderness in palm. limitation of motion is slightly better.   Lab Results  Recent Labs  04/14/15 0410 04/16/15 0434  WBC 7.3 4.9  HGB 10.6* 10.6*  HCT 32.8* 32.4*  NA 135 136  K 3.6 3.8  CL 102 102  CO2 26 27  BUN 6 10  CREATININE 1.02* 0.87   Liver Panel  Recent Labs  04/14/15 0410  PROT 6.3*  ALBUMIN 3.1*  AST 14*  ALT 13*  ALKPHOS 51  BILITOT 0.8   Sedimentation Rate  Recent Labs  04/13/15 1841  ESRSEDRATE 28*   C-Reactive Protein No results for input(s): CRP in the last 72 hours.  Microbiology: Recent Results (from the past 240 hour(s))  Surgical pcr screen     Status: None   Collection Time: 04/13/15  6:35 PM  Result Value Ref Range Status   MRSA, PCR NEGATIVE NEGATIVE Final   Staphylococcus aureus NEGATIVE NEGATIVE Final    Comment:        The Xpert SA Assay (FDA approved for NASAL specimens in patients over 65 years of age), is one component of a comprehensive surveillance  program.  Test performance has been validated by [facility] for patients greater than or equal to 63 year old. It is not intended to diagnose infection nor to guide or monitor treatment.   Culture, blood (routine x 2)     Status: None (Preliminary result)   Collection Time: 04/13/15  6:41 PM  Result Value Ref Range Status   Specimen Description BLOOD RIGHT HAND  Final   Special Requests IN PEDIATRIC BOTTLE 4CC  Final   Culture NO GROWTH 3 DAYS  Final   Report Status PENDING  Incomplete  Culture, blood (routine x 2)     Status: None (Preliminary result)   Collection Time: 04/13/15  6:46 PM  Result Value Ref Range Status   Specimen Description BLOOD RIGHT ANTECUBITAL  Final   Special Requests IN PEDIATRIC BOTTLE 4CC  Final   Culture NO GROWTH 3 DAYS  Final   Report Status PENDING  Incomplete  Culture, routine-abscess     Status:  None (Preliminary result)   Collection Time: 04/13/15  9:15 PM  Result Value Ref Range Status   Specimen Description ABSCESS  Final   Special Requests RIGHT LONG FINGER  Final   Gram Stain   Final    RARE WBC PRESENT,BOTH PMN AND MONONUCLEAR NO SQUAMOUS EPITHELIAL CELLS SEEN ABUNDANT GRAM POSITIVE COCCI IN PAIRS IN CHAINS IN CLUSTERS Performed at [facility]    Culture   Final    MULTIPLE ORGANISMS PRESENT, NONE PREDOMINANT Performed at [facility]    Report Status PENDING  Incomplete  Anaerobic culture     Status: None (Preliminary result)   Collection Time: 04/13/15  9:15 PM  Result Value Ref Range Status   Specimen Description ABSCESS  Final   Special Requests RIGHT LONG FINGER  Final   Gram Stain   Final    RARE WBC PRESENT,BOTH PMN AND MONONUCLEAR NO SQUAMOUS EPITHELIAL CELLS SEEN ABUNDANT GRAM POSITIVE COCCI IN PAIRS IN CHAINS IN CLUSTERS Performed at [facility]    Culture   Final    NO ANAEROBES ISOLATED; CULTURE IN PROGRESS FOR 5 DAYS Performed at [facility]    Report Status PENDING  Incomplete    Studies/Results: No results found.   Assessment/Plan: Septic flexor tenosynovitis R 3rd digit, osteomyelitis Anemia due to menorrhagia MRSE UTI  Total days of antibiotics: 4 vanco Await her Cx- still pending/polymicrobial No change in anbx for now  Therapeutic Drug Monitoring vanco trough is therapeutic, sightly low.  Watch Cr (NL today)  I will follow her labs over weekend, change anbx as needed, o/w available as needed.          Johny Sax Infectious Diseases (pager) [phone] [email or web address] 04/16/2015, 5:07 PM  LOS: 3 days

## 2015-04-16 NOTE — Progress Notes (Signed)
Subjective: Patient reports she is able to tolerate meals but still endorses some nausea and is not tolerating PO pills. Pain is under control with morphine.  Objective: Vital signs in last 24 hours: Filed Vitals:   04/15/15 1554 04/15/15 2247 04/16/15 0520 04/16/15 1243  BP: 127/69 127/75 108/59 125/89  Pulse: 92 76 76 77  Temp: 98.4 F (36.9 C) 98 F (36.7 C) 98 F (36.7 C) 98 F (36.7 C)  TempSrc: Oral Oral  Oral  Resp: _0 Height:      Weight:      SpO2: 100% 100% 100% 99%   Weight change:   Intake/Output Summary (Last 24 hours) at 04/16/15 1409 Last data filed at 04/16/15 0930  Gross per 24 hour  Intake    690 ml  Output      0 ml  Net    690 ml   Physical Exam GENERAL- alert, co-operative, appears as stated age, not in any distress. HEENT- Atraumatic, normocephalic, PERRL, EOMI, oral mucosa appears moist CARDIAC- RRR, no murmurs, rubs or gallops RESP- Moving equal volumes of air, and clear to auscultation bilaterally, no wheezes or crackles. ABDOMEN- Soft, nontender, no guarding or rebound, bowel sounds present. NEURO- No obvious Cr N abnormality EXTREMITIES- pulse 2+, symmetric, no pedal edema.  MSK - Right hand wrapped in dressing. Pain with movement. SKIN- Warm, dry, No rash or lesion. PSYCH- Normal mood and affect, appropriate thought content and speech.  Lab Results: Basic Metabolic Panel:  Recent Labs Lab 04/13/15 1841 04/14/15 0410 04/16/15 0434  NA  --  135 136  K  --  3.6 3.8  CL  --  102 102  CO2  --  26 27  GLUCOSE  --  111* 97  BUN  --  6 10  CREATININE  --  1.02* 0.87  CALCIUM  --  8.3* 8.5*  MG 1.9  --   --    Liver Function Tests:  Recent Labs Lab 04/14/15 0410  AST 14*  ALT 13*  ALKPHOS 51  BILITOT 0.8  PROT 6.3*  ALBUMIN 3.1*   CBC:  Recent Labs Lab 04/14/15 0410 04/16/15 0434  WBC 7.3 4.9  HGB 10.6* 10.6*  HCT 32.8* 32.4*  MCV 88.4 89.5  PLT 222 214   Anemia Panel:  Recent Labs Lab  04/16/15 0434  FERRITIN 7*  TIBC 335  IRON 26*  RETICCTPCT 1.3   Urine Drug Screen: Drugs of Abuse     Component Value Date/Time   LABOPIA NONE DETECTED 04/04/2014 1427   COCAINSCRNUR NONE DETECTED 04/04/2014 1427   LABBENZ NONE DETECTED 04/04/2014 1427   AMPHETMU NONE DETECTED 04/04/2014 1427   THCU POSITIVE* 04/04/2014 1427   LABBARB NONE DETECTED 04/04/2014 1427    Micro Results: Recent Results (from the past 240 hour(s))  Surgical pcr screen     Status: None   Collection Time: 04/13/15  6:35 PM  Result Value Ref Range Status   MRSA, PCR NEGATIVE NEGATIVE Final   Staphylococcus aureus NEGATIVE NEGATIVE Final    Comment:        The Xpert SA Assay (FDA approved for NASAL specimens in patients over 32 years of age), is one component of a comprehensive surveillance program.  Test performance has been validated by [facility] for patients greater than or equal to 52 year old. It is not intended to diagnose infection nor to guide or monitor treatment.   Culture, blood (routine x 2)  Status: None (Preliminary result)   Collection Time: 04/13/15  6:41 PM  Result Value Ref Range Status   Specimen Description BLOOD RIGHT HAND  Final   Special Requests IN PEDIATRIC BOTTLE 4CC  Final   Culture NO GROWTH 3 DAYS  Final   Report Status PENDING  Incomplete  Culture, blood (routine x 2)     Status: None (Preliminary result)   Collection Time: 04/13/15  6:46 PM  Result Value Ref Range Status   Specimen Description BLOOD RIGHT ANTECUBITAL  Final   Special Requests IN PEDIATRIC BOTTLE 4CC  Final   Culture NO GROWTH 3 DAYS  Final   Report Status PENDING  Incomplete  Culture, routine-abscess     Status: None (Preliminary result)   Collection Time: 04/13/15  9:15 PM  Result Value Ref Range Status   Specimen Description ABSCESS  Final   Special Requests RIGHT LONG FINGER  Final   Gram Stain   Final    RARE WBC PRESENT,BOTH PMN AND MONONUCLEAR NO SQUAMOUS EPITHELIAL CELLS  SEEN ABUNDANT GRAM POSITIVE COCCI IN PAIRS IN CHAINS IN CLUSTERS Performed at [facility]    Culture   Final    MULTIPLE ORGANISMS PRESENT, NONE PREDOMINANT Performed at [facility]    Report Status PENDING  Incomplete  Anaerobic culture     Status: None (Preliminary result)   Collection Time: 04/13/15  9:15 PM  Result Value Ref Range Status   Specimen Description ABSCESS  Final   Special Requests RIGHT LONG FINGER  Final   Gram Stain   Final    RARE WBC PRESENT,BOTH PMN AND MONONUCLEAR NO SQUAMOUS EPITHELIAL CELLS SEEN ABUNDANT GRAM POSITIVE COCCI IN PAIRS IN CHAINS IN CLUSTERS Performed at [facility]    Culture   Final    NO ANAEROBES ISOLATED; CULTURE IN PROGRESS FOR 5 DAYS Performed at [facility]    Report Status PENDING  Incomplete   Studies/Results: No results found. Medications: I have reviewed the patient's current medications. Scheduled Meds: . ferrous sulfate  325 mg Oral Q breakfast  . vancomycin  1,000 mg Intravenous Q8H   Continuous Infusions: . lactated ringers 10 mL/hr at 04/16/15 1319   PRN Meds:.acetaminophen **OR** acetaminophen, HYDROcodone-acetaminophen, ondansetron **OR** ondansetron (ZOFRAN) IV, senna-docusate, sodium chloride Assessment/Plan: Principal Problem:   Osteomyelitis of finger Active Problems:   Hypokalemia   Chronic blood loss anemia   Tenosynovitis of finger  Infection of Right Long Finger: Complaining of continued nausea and unable to tolerate PO medications though is able to tolerate food with no issues. s/p excisional debridement with irrigation/drainage of flexor tendon sheath and removal of nail plate on 9/6. X-ray was concerning for possible osteomyelitis. ESR mildly elevated at 28. Unclear source of infection. Possible paronychia infection that spread. MRI confirms osteomyelitits. - Appreciate ID consult - Blood cultures NG at 48 hours - Wound cultures showing abundant G+ cocci in pairs  in chains in clusters, growing multiple organisms with no predominance - Will stop Vanc in setting of cultures with no predominant organism with low suspicion for MRSA - Will start Ceftriaxone after speaking with my attending - PICC line in place - Stopping Morphine  - Transition to PO pain medications. Norco 5-325 q4hr prn. - Will restart Mobic 15 mg daily per ortho recs for inflammation - Follow up with ortho as outpatient in 10-15 days   Hypokalemia: Resolved  Chronic blood loss anemia 2/2 menorrhagia: Patient on Fe supplementation at home. Hgb 11.8 on admission, stable at  10.6 today. Fe 26, TIBC 335, % sat 8%, Ferritin 7. - Continue Fe supplementation.   DVT PPx: SCDs  Dispo: Discharge today with follow up in our clinic in 2 weeks.  The patient does not have a current PCP (No Pcp Per Patient) and does need an [facility] hospital follow-up appointment after discharge.  The patient does not have transportation limitations that hinder transportation to clinic appointments.  .Services Needed at time of discharge: Y = Yes, Blank = No PT:   OT:   RN:   Equipment:   Other:     LOS: 3 days   Maryellen Pile, MD 04/16/2015, 2:09 PM

## 2015-04-16 NOTE — Progress Notes (Signed)
Pt refused dressing change this AM.

## 2015-04-16 NOTE — Care Management Note (Signed)
Case Management Note  Patient Details  Name: [name] MRN: [id number] Date of Birth: 11/01/1985  Subjective/Objective:                    Action/Plan:  Patient will be staying with her sister [name] [phone] at discharge . [name]'s address is : [address] [place], [place] , [place] [address] [facility] ) . Patient's cell [phone] , [name] [phone] . [facility] given information. Expected Discharge Date:                  Expected Discharge Plan:  Home w Home Health Services  In-House Referral:     Discharge planning Services  CM Consult  Post Acute Care Choice:  Home Health Choice offered to:  Patient  DME Arranged:    DME Agency:     HH Arranged:    HH Agency:  [facility]  Status of Service:  In process, will continue to follow  Medicare Important Message Given:    Date Medicare IM Given:    Medicare IM give by:    Date Additional Medicare IM Given:    Additional Medicare Important Message give by:     If discussed at Long Length of Stay Meetings, dates discussed:    Additional Comments:  Kingsley Plan, RN 04/16/2015, 9:58 AM

## 2015-04-16 NOTE — Progress Notes (Signed)
Discussed discharge summary with patient. Reviewed all medications with patient. Patient received Rx. Patient discharge home with PICC line. Patient educated on wound treatment at home. Patient ready for discharge.

## 2015-04-16 NOTE — Progress Notes (Signed)
Occupational Therapy Treatment Patient Details Name: [name] MRN: [id number] DOB: [birth year] Today's Date: 04/16/2015    History of present illness Pt is a 29 yo female admitted 2 weeks after child shut her finger in sliding glass door.  Pt with crush injury and now underwent R long finger debridement and flexor tendor sheath drainage.  Pt with unusual development of osteomyelitis.   OT comments  Focus of session included edema management, R UE AAROM of digit flexion and extension consisting of 5 reps while sitting EOB and compensatory technique for ADL completion. Pt provided with red foam tubing to assist with oral care and self-feeding to increase pt's independence with ADL/IADL activities.  Follow Up Recommendations  Outpatient OT    Equipment Recommendations  None recommended by OT    Recommendations for Other Services      Precautions / Restrictions Precautions Precautions: None Restrictions Weight Bearing Restrictions: No Other Position/Activity Restrictions: Position R arm/hand above heart at all times       Mobility Bed Mobility Overal bed mobility: Independent             General bed mobility comments: Pt was sitting EOB upon arrival  Transfers                      Balance Overall balance assessment: Independent                                 ADL Overall ADL's : Needs assistance/impaired Eating/Feeding: Set up;With adaptive utensils;Sitting (Utilizing red tubing on utensils)                                   Functional mobility during ADLs: Independent General ADL Comments: PPt encouraged to use red tubing for grooming and feeding task.      Vision                     Perception     Praxis      Cognition   Behavior During Therapy: WFL for tasks assessed/performed Overall Cognitive Status: Within Functional Limits for tasks assessed                       Extremity/Trunk Assessment               Exercises General Exercises - Upper Extremity Digit Composite Flexion: AAROM;Right;Seated;5 reps Composite Extension: AROM;Right;Seated;5 reps   Shoulder Instructions       General Comments      Pertinent Vitals/ Pain       Pain Assessment: 0-10 Pain Score: 4  Pain Location: R hand Pain Descriptors / Indicators: Aching;Constant;Throbbing Pain Intervention(s): Other (comment) (Elevated R hand using pillows)  Home Living                                          Prior Functioning/Environment              Frequency Min 3X/week     Progress Toward Goals  OT Goals(current goals can now be found in the care plan section)  Progress towards OT goals: Progressing toward goals  Acute Rehab OT Goals Patient Stated Goal: to be able to use hand to do childrens hair OT  Goal Formulation: With patient Time For Goal Achievement: 04/29/15 Potential to Achieve Goals: Good ADL Goals Pt Will Perform Eating: sitting;with modified independence Pt Will Perform Grooming: with modified independence;standing Pt/caregiver will Perform Home Exercise Program: Increased ROM;Right Upper extremity;Independently Additional ADL Goal #1: Pt will toilet Ily using LUE as needed for hygiene.  Plan      Co-evaluation                 End of Session Equipment Utilized During Treatment: Other (comment) (Pt provided with red tubing)   Activity Tolerance Patient tolerated treatment well;Patient limited by pain   Patient Left in bed;with call bell/phone within reach   Nurse Communication          Time: 4098-1191 OT Time Calculation (min): 21 min  Charges: OT General Charges $OT Visit: 1 Procedure OT Treatments $Therapeutic Exercise: 8-22 mins  Smiley Houseman 04/16/2015, 2:52 PM

## 2015-04-17 LAB — CULTURE, ROUTINE-ABSCESS

## 2015-04-18 LAB — CULTURE, BLOOD (ROUTINE X 2)
CULTURE: NO GROWTH
Culture: NO GROWTH

## 2015-04-19 ENCOUNTER — Telehealth: Payer: Self-pay | Admitting: *Deleted

## 2015-04-19 ENCOUNTER — Encounter: Payer: Self-pay | Admitting: Occupational Therapy

## 2015-04-19 ENCOUNTER — Ambulatory Visit: Payer: Medicaid Other | Attending: Internal Medicine | Admitting: Occupational Therapy

## 2015-04-19 DIAGNOSIS — M25641 Stiffness of right hand, not elsewhere classified: Secondary | ICD-10-CM | POA: Diagnosis present

## 2015-04-19 DIAGNOSIS — M79644 Pain in right finger(s): Secondary | ICD-10-CM | POA: Diagnosis not present

## 2015-04-19 DIAGNOSIS — R279 Unspecified lack of coordination: Secondary | ICD-10-CM | POA: Insufficient documentation

## 2015-04-19 DIAGNOSIS — M6289 Other specified disorders of muscle: Secondary | ICD-10-CM | POA: Diagnosis present

## 2015-04-19 DIAGNOSIS — R29898 Other symptoms and signs involving the musculoskeletal system: Secondary | ICD-10-CM

## 2015-04-19 LAB — ANAEROBIC CULTURE

## 2015-04-19 NOTE — Patient Instructions (Signed)
Flexor Tendon Gliding (Active Hook Fist)   With fingers and knuckles straight, bend middle and tip joints. Do not bend large knuckles. Repeat _10-15___ times. Do _4-6___ sessions per day.  MP Flexion (Active)   With back of hand on table, bend large knuckles as far as they will go, keeping small joints straight. Repeat _10-15___ times. Do __4-6__ sessions per day. Activity: Reach into a narrow container.*      Finger Flexion / Extension   With palm up, bend fingers of left hand toward palm, making a  fist. Straighten fingers, opening fist. Repeat sequence _10-15___ times per session. Do _4-6__ sessions per day. Hand Variation: Palm down    PROM: Finger MP Joints   Passively bend ___long_____ finger of hand at big knuckle until stretch is felt. Hold _10___ seconds. Relax. Straighten finger as far as possible and hold 10 sec. Repeat __5__ times per set.  Do __4-6__ sessions per day.   PIP Flexion (Passive)   Use other hand to bend the middle joint of __long____ finger down as far as possible. Hold _10___ seconds. Then straighten as far as possible and hold 10 sec. Repeat __5__ times. Do _4-6___ sessions per day.

## 2015-04-19 NOTE — Discharge Summary (Signed)
Name: [name] Cook MRN: [id number] DOB: [birth year] 29 y.o. PCP: No Pcp Per Patient  Date of Admission: 04/13/2015  2:05 PM Date of Discharge: 04/19/2015 Attending Physician: Dr. Cyndie Chime  Discharge Diagnosis: Principal Problem:   Osteomyelitis of finger Active Problems:   Iron deficiency anemia   Tenosynovitis of finger  Discharge Medications:   Medication List    STOP taking these medications        naproxen 375 MG tablet  Commonly known as:  NAPROSYN     oxyCODONE 5 MG immediate release tablet  Commonly known as:  Oxy IR/ROXICODONE      TAKE these medications        cefTRIAXone 2 g in dextrose 5 % 50 mL  Inject 2 g into the vein daily.     ferrous sulfate 325 (65 FE) MG tablet  Take 1 tablet (325 mg total) by mouth daily with breakfast.     HYDROcodone-acetaminophen 5-325 MG per tablet  Commonly known as:  NORCO/VICODIN  Take 1 tablet by mouth every 4 (four) hours as needed for moderate pain.     meloxicam 15 MG tablet  Commonly known as:  MOBIC  Take 1 tablet (15 mg total) by mouth daily.     meloxicam 15 MG tablet  Commonly known as:  MOBIC  Take 1 tablet (15 mg total) by mouth daily.     ondansetron 4 MG tablet  Commonly known as:  ZOFRAN  Take 1 tablet (4 mg total) by mouth every 6 (six) hours as needed for nausea.        Disposition and follow-up:   [name] was discharged from [facility] in Stable condition.  At the hospital follow up visit please address:  1.  [name] Cook was seen for osteomyelitis of her right middle finger. She was sent home with 6 week course of Ceftriaxone. Please follow up her cultures and how she is tolerating the antibiotics.   2.  Labs / imaging needed at time of follow-up: None  3.  Pending labs/ test needing follow-up: Blood and Wound cultures  Follow-up Appointments:     Follow-up Information    Schedule an appointment as soon as possible for a visit with Jodi Marble., MD.   Specialty:  Orthopedic Surgery   Why:  for 10-15 days following surgery   Contact information:   [address] [place] [address] [phone]       Follow up with [facility].   Specialty:  Rehabilitation   Why:  They will call you for appointment    Contact information:   [address] [id number] mc [place] [address] [phone]      Follow up with [facility]. Schedule an appointment as soon as possible for a visit in 2 weeks.   Contact information:   [address] [place] [address] [phone]      Discharge Instructions: Discharge Instructions    Ambulatory referral to Occupational Therapy    Complete by:  As directed   S/p surgical treatment for R LF infection.  Please continue ROM exercises without restrictions.  Please schedule to begin early next week to provide seamless transition from inpatient care.     Diet - low sodium heart healthy    Complete by:  As directed      Increase activity slowly    Complete by:  As directed  Consultations:    Procedures Performed:  Mr Hand Right Wo Contrast  04/14/2015   CLINICAL DATA:  Long finger pain and swelling. Finger infection. Incisional debridement 04/13/2015.  EXAM: MRI OF THE RIGHT HAND WITHOUT CONTRAST  TECHNIQUE: Multiplanar, multisequence MR imaging was performed. No intravenous contrast was administered.  COMPARISON:  04/13/2015.  04/03/2015.  FINDINGS: Osteomyelitis of terminal phalanx of the long finger is present. Diffuse edema at a extends proximally from the finger tip to the third metacarpal. This may be postoperative and/or infectious. Third finger is markedly swollen.  There is low signal material volar to the DIP joint of the long finger. This overlies the insertion of the flexor tendons on the terminal phalanx. The differential considerations are clot, necrotic tissue or packing material.   No convincing evidence of DIP joint septic arthritis and bone marrow signal in the distal aspect of the middle phalanx appears normal. The other visible rays appear normal.  IMPRESSION: 1. Osteomyelitis of the terminal phalanx of the long finger. There is diffuse bone marrow edema throughout the terminal phalanx however the infection has likely originated in the finger tuft dorsally. 2. Diffuse edema/cellulitis of the long finger. 3. No convincing evidence of septic arthritis of the DIP joint.   Electronically Signed   By: Andreas Newport M.D.   On: 04/14/2015 11:53   Dg Finger Middle Right  04/13/2015   CLINICAL DATA:  Pain and swelling of the distal right middle finger due to a crushing injury 04/01/2015.  EXAM: RIGHT MIDDLE FINGER 2+V  COMPARISON:  04/03/2015  FINDINGS: There several areas of bone destruction or avulsion at the tuft of the distal phalanx. There is also a new area of cortical destruction of the volar aspect of the shaft of the distal phalanx as well as another area of cortical lucency at the dorsal aspect of the base of the distal phalanx. The findings are suggestive of osteomyelitis. The even with benefit of retrospection, there is no visible fracture on the prior radiograph.  IMPRESSION: Multiple areas of bone destruction of the distal phalanx of the right middle finger consistent with osteomyelitis.   Electronically Signed   By: Francene Boyers M.D.   On: 04/13/2015 14:43   Dg Finger Middle Right  04/03/2015   CLINICAL DATA:  Patient's smashed right middle finger into hotel room door. Edema for 2 days.  EXAM: RIGHT MIDDLE FINGER 2+V  COMPARISON:  None.  FINDINGS: Soft tissue swelling about the distal aspect of the right third finger. Bones appear intact. No evidence of acute fracture or dislocation. No focal bone lesion or bone destruction. No radiopaque soft tissue foreign bodies.  IMPRESSION: Soft tissue swelling.  No acute bony abnormalities.   Electronically Signed   By: Burman Nieves M.D.   On: 04/03/2015 02:49   Admission HPI: [name] is a 29 year old african american female with a PMH of menorrhagia with Fe deficiency anemia presents to [facility] ED complaining of finger infection. She was previously seen in the ED on 8/27 following having her right middle finger crushed by a sliding door on 8/25. X-rays done at the time were negative for any fractures. Started on Bactrim. Patient reports that 2-3 days ago the pain began to worsen with pus drainage around the nail. She managed the drainage by cleaning the are with peroxide but has not resolved. Today she reports pain in her right middle digit with worsening swelling. Denies any fevers, chills, erythema, N/V/D or abdominal pain. X-rays done in the  ED with multiple areas of bone destruction of the distal phalanx of the right middle finger consistent with osteomyelitis. Ortho was consulted and she will go for surgery to drain pus and for debulking. Holding antibiotics until after surgery for wound cultures. Received Tdap in ED.   Hospital Course by problem list: Principal Problem:   Osteomyelitis of finger Active Problems:   Iron deficiency anemia   Tenosynovitis of finger   Osteomyelitis of Right Long Finger: Most likely of paronychia source that progressed into possible osteomyelitis and early flexor tenosynovitis. Unclear if crush injury has any relation to the infection. Tdap was given in the ED. Afebrile with no leukocytosis. Xrays were concerning for possible osteomyelitis. Typical changes of osteomyelitis confirmed by MRI scanning. Orthopedic surgery was consulted and she was taken for urget surgical debridement with irrgation and drainage of the flexor tendon shealht and emoval of the nail plate by Dr. Mack Hook. Wound cultures were taken during surgery. Blood cultures were drawn. Vancomycin and Zosyn were started after surgery. Blood cultures were negative final results. Wound culutre with g+ cocci in chains in  clusters. No organisms isolated. Unlikely to be from MRSA and we switched to ceftriaxone. PICC was placed and she was discharged to complete 6 weeks of outpatient ceftriaxone. Follow up in our clinic in 2 weeks and with Dr. Mack Hook in 10-15 days.   Hypokalemia: K 3.3 on admission. Repleted with KCl 40 mEq. Improved following supplemental K.   Chronic blood loss anemia 2/2 menorrhagia: Patient on Fe supplementation at home. Hgb 11.8 on admission. Hgb stable during hospitalization. Started on Fe supplementation.  Discharge Vitals:   BP 125/89 mmHg  Pulse 77  Temp(Src) 98 F (36.7 C) (Oral)  Resp 18  Ht 6\' 1"  (1.854 m)  Wt 135 lb (61.236 kg)  BMI 17.82 kg/m2  SpO2 99%  LMP 04/06/2015  Discharge Labs:  No results found for this or any previous visit (from the past 24 hour(s)).  Signed: Valentino Nose, MD 04/19/2015, 8:36 PM

## 2015-04-19 NOTE — Therapy (Signed)
[facility] [address] [place], [place], [address] Phone: [phone]   Fax:  [phone]  Occupational Therapy Evaluation  Patient Details  Name: [name] MRN: [id number] Date of Birth: 1986/05/31 Referring Provider:  Shelly Bombard, MD  Encounter Date: 04/19/2015      OT End of Session - 04/19/15 1126    Visit Number 1   Number of Visits 8   Authorization Type MCD - not a qualifying diagnosis for treatment   OT Start Time 0845   OT Stop Time 1010   OT Time Calculation (min) 85 min   Activity Tolerance Patient limited by pain;Treatment limited secondary to agitation      Past Medical History  Diagnosis Date  . Abnormal Pap smear   . STD (female)     chlamydia and trich  . Hx MRSA infection     History only - 2 Negative MRSA swabs in 2012  . SVD (spontaneous vaginal delivery)     x 5  . Depression     no meds   . Post-partum depression     hx  . Iron deficiency anemia     Past Surgical History  Procedure Laterality Date  . Cervical biopsy  w/ loop electrode excision    . Wisdom tooth extraction    . Laparoscopic tubal ligation Bilateral 12/02/2013    Procedure: LAPAROSCOPIC TUBAL LIGATION WITH FILSCHE CLIPS;  Surgeon: Adam Phenix, MD;  Location: [facility];  Service: Gynecology;  Laterality: Bilateral;  FILSCHE CLIPS  . Incision and drainage Right 04/13/2015    long finger excisional debridement of skin and subcutaneous tissues, with irrigation/drainage of the flexor tendon sheath, and removal of nail plate  . Tubal ligation Bilateral   . I&d extremity Right 04/13/2015    Procedure: RIGHT LONG FINGER DEBRIDEMENT AND FLEXOR TENDON SHEATH DRAINAGE;  Surgeon: Mack Hook, MD;  Location: [facility] OR;  Service: Orthopedics;  Laterality: Right;    There were no vitals filed for this visit.  Visit Diagnosis:  Pain in finger of right hand - Plan: Ot plan of care cert/re-cert  Stiffness of joint, hand,  right - Plan: Ot plan of care cert/re-cert  Weakness of right hand - Plan: Ot plan of care cert/re-cert  Lack of coordination - Plan: Ot plan of care cert/re-cert      Subjective Assessment - 04/19/15 0857    Subjective  I cannot take this   Limitations LUE IV antibiotics, decreased pain tolerance   Repetition Increases Symptoms   Patient Stated Goals to get my fingers back to normal   Currently in Pain? Yes   Pain Score 10-Worst pain ever   Pain Location Finger (Comment which one)  LONG   Pain Orientation Right   Pain Descriptors / Indicators Sore;Throbbing   Pain Type Surgical pain;Acute pain   Pain Onset In the past 7 days   Pain Frequency Constant   Aggravating Factors  Taking care of children, DRESSING CHANGES!   Pain Relieving Factors pain meds, rest           [facility] OT Assessment - 04/19/15 0001    Assessment   Diagnosis infection of Rt long finger, s/p I & D 04/13/15   Onset Date 04/13/15   Assessment pt arrived wrapped with gauze Rt long finger   Prior Therapy acute care   Precautions   Precautions --  no heavy lifting > 10 lbs Rt hand, Lt arm IV   Required Braces or Orthoses --  IV antibiotics/saline   Balance Screen   Has the patient fallen in the past 6 months No   Has the patient had a decrease in activity level because of a fear of falling?  No   Is the patient reluctant to leave their home because of a fear of falling?  No   Home  Environment   Additional Comments Pt currently living with best friend and her twin 3 y.o.'s secondary to husband working 2 full time jobs.    Lives With Spouse  has 5 children (14, 13, 7, and twin 3 y.o.'s).    Prior Function   Level of Independence Independent  and driving   Vocation On disability   ADL   ADL comments Feeding self with Lt non dominant hand primarily, brushing teeth/hair with Rt hand but with difficulty and extra time. Mod I for dressing, and bathing in tub. Pt's best friend doing all cooking and cleaning at  this time.    Mobility   Mobility Status Independent   Written Expression   Dominant Hand Right   Observation/Other Assessments   Observations Pt wrapped with thick gauze dressing upon arrival. Pt with very low threshhold tolerance to pain when attempting to do dressing change.    Sensation   Additional Comments not formally assessed. Pt reports tingling and throbbing sensation   Coordination   Fine Motor Movements are Fluid and Coordinated No   Coordination Pt can hold built up eating utensil and toothbrush in hand for some feeding and all grooming, but very difficulty. Unable to assess 9 hole peg test d/t time constraints and decreased tolerance   Edema   Edema moderate Rt long finger   ROM / Strength   AROM / PROM / Strength AROM;PROM   AROM   Overall AROM Comments Rt long MP flexion = 90*, ext = -60*. Rt long PIP flex = 55*, ext = -45* (10 degrees of motion total). Pt remains in flexed position at MP's and approx. 45* PIP flexion. Pt with decreased tolerance to any ROM   PROM   Overall PROM Comments Pt would not allow therapist to perform P/ROM to Rt long finger. Pt does demo P/ROM in other non-involved digits WFL's after stretching, however stiff.    Hand Function   Right Hand Grip (lbs) TBA   Left Hand Grip (lbs) TBA                  OT Treatments/Exercises (OP) - 04/19/15 0001    ADLs   ADL Comments Performed dressing changes which took 45 minutes to remove gauze and xeroform secondary to pt's pain level and decreased tolerance. Cleaned nail bed/tip of finger with saline, air dried, then placed new xeroform and new gauze over nail bed. Applied finger stockinette over gauze. Provided pt with 3 extra finger stockinettes. (Pt already has saline, xeroform, and gauze at home). Recommended pt change dressing daily and allow to air dry 5-10 minutes before applying new xeroform and gauze. Pt agreed. Pt also instructed to take pain meds prior to appointment d/t high pain level and  inability to do exercises fully today. Pt agreed (Pt has someone to drive her to appts and pick up)   Exercises   Exercises Hand   Hand Exercises   Other Hand Exercises see pt instructions: emphasis on A/ROM in isolated MP flex/extension, PIP flex/extension, and composite flexion/extension. Followed by P/ROM in MP extension, PIP flex/extension.  OT Education - 04/19/15 1002    Education provided Yes   Education Details A/ROM and P/ROM HEP, how to perform dressing changes   Person(s) Educated Patient   Methods Explanation;Demonstration;Handout   Comprehension Verbalized understanding;Returned demonstration  Return demo as able          OT Short Term Goals - 04/19/15 1132    OT SHORT TERM GOAL #1   Title Independent with dressing changes    Baseline Pt shown by therapist during eval, but decreased tolerance. Nurse has performed at home last 2 times   Status New   OT SHORT TERM GOAL #2   Title Independent with initial HEP   Baseline issued at eval   Status On-going           OT Long Term Goals - 04/19/15 1136    OT LONG TERM GOAL #1   Title Pt independent with updated HEP   Time --  AS ABLE - MCD will not cover   Status New   OT LONG TERM GOAL #2   Title Pain less than or equal to 5/10 with dressing changes and ROM    Baseline 10/10   Status New   OT LONG TERM GOAL #3   Title Pt to return to consistently eating and writing with Rt dominant hand   Baseline only 25% or less eating, no writing (100% grooming with difficulty/extra time)   Status New   OT LONG TERM GOAL #4   Title Pt to demo gross composite finger flexion and extension to 80% or greater   Status New               Plan - 04/19/15 1127    Clinical Impression Statement Pt is a 29 y.o. female who presents to outpatient rehab s/p irrigation and debridement Rt long finger (Rt handed) and nail bed removal on 04/13/15. Pt presents to evaluation today with severely decreased pain  tolerance limiting ability to perform dressing changes, fully assess ROM, strength, coordination, and perform exercises.    Rehab Potential Fair   Clinical Impairments Affecting Rehab Potential decreased pain tolerance/sensitivty, decreased financial resources limiting therapy visits   OT Frequency --  1-2x/wk    OT Duration 8 weeks   recommended. However, d/t MCD not covering future therapy visits (not a qualifying diagnosis), may only be able to see 1-2 more visits   OT Treatment/Interventions Self-care/ADL training;Therapeutic exercise;Patient/family education;Splinting;Manual Therapy;Compression bandaging;DME and/or AE instruction;Therapeutic activities;Nature conservation officer;Contrast Bath;Passive range of motion   Plan Assess coordination, grip strength; reassess finger ROM. Update HEP prn   Consulted and Agree with Plan of Care Patient        Problem List Patient Active Problem List   Diagnosis Date Noted  . Acute cystitis without hematuria   . Tenosynovitis of finger 04/15/2015  . Osteomyelitis of finger   . Iron deficiency anemia 04/13/2015  . Encounter for sterilization 12/02/2013  . Contraception management 10/30/2013  . Hepatitis B surface antigen positive 06/26/2012  . 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 Thrombocytopenia 04/05/2011  . Abnormal Pap smear of cervix 04/05/2011  . Hx LEEP (loop electrosurgical excision procedure), cervix, pregnancy 04/05/2011    Kelli Churn, OTR/L 04/19/2015, 11:44 AM  [facility] [address] [place], [place], [address] Phone: [phone]   Fax:  [phone]

## 2015-04-19 NOTE — Telephone Encounter (Signed)
Madolyn Frieze, rn, [facility] admissions nurse calls and states pt is having N&V with every dose of home IV abx. She has called ID and the physician told her that he did not write orders and cannot help her, there are no disch orders or f/u's in the chart so there is no one to refer her to, she was told she may call the ortho surgeon that cared for pt and i have also paged dr boswell and gave him an update on pt problem, he states he will call back in a few hours and inform triage as to what advanced and pt can do at this time, will await his call

## 2015-04-20 NOTE — Telephone Encounter (Signed)
Noted & discussed w Dr Karma Greaser who will call in Zofran for pt

## 2015-04-23 ENCOUNTER — Inpatient Hospital Stay: Payer: Medicaid Other | Admitting: Family Medicine

## 2015-04-26 ENCOUNTER — Encounter: Payer: Self-pay | Admitting: Occupational Therapy

## 2015-04-26 ENCOUNTER — Ambulatory Visit: Payer: Medicaid Other | Admitting: Occupational Therapy

## 2015-04-26 DIAGNOSIS — M79644 Pain in right finger(s): Secondary | ICD-10-CM | POA: Diagnosis not present

## 2015-04-26 DIAGNOSIS — R29898 Other symptoms and signs involving the musculoskeletal system: Secondary | ICD-10-CM

## 2015-04-26 DIAGNOSIS — M25641 Stiffness of right hand, not elsewhere classified: Secondary | ICD-10-CM

## 2015-04-26 DIAGNOSIS — R279 Unspecified lack of coordination: Secondary | ICD-10-CM

## 2015-04-26 NOTE — Therapy (Signed)
[facility] [address] [place], [place], [address] Phone: [phone]   Fax:  [phone]  Occupational Therapy Treatment  Patient Details  Name: [name] MRN: [id number] Date of Birth: 03-24-1986 Referring Collie Kittel:  Thom Chimes, MD  Encounter Date: 04/26/2015      OT End of Session - 04/26/15 1305    Visit Number 2   Authorization Type MCD - not a qualifying diagnosis for treatment, however appointment notes approve 6 visits   Authorization - Visit Number 2   Authorization - Number of Visits 6   OT Start Time 1100   OT Stop Time 1150   OT Time Calculation (min) 50 min   Activity Tolerance Patient tolerated treatment well      Past Medical History  Diagnosis Date  . Abnormal Pap smear   . STD (female)     chlamydia and trich  . Hx MRSA infection     History only - 2 Negative MRSA swabs in 2012  . SVD (spontaneous vaginal delivery)     x 5  . Depression     no meds   . Post-partum depression     hx  . Iron deficiency anemia     Past Surgical History  Procedure Laterality Date  . Cervical biopsy  w/ loop electrode excision    . Wisdom tooth extraction    . Laparoscopic tubal ligation Bilateral 12/02/2013    Procedure: LAPAROSCOPIC TUBAL LIGATION WITH FILSCHE CLIPS;  Surgeon: Woodroe Mode, MD;  Location: [facility];  Service: Gynecology;  Laterality: Bilateral;  FILSCHE CLIPS  . Incision and drainage Right 04/13/2015    long finger excisional debridement of skin and subcutaneous tissues, with irrigation/drainage of the flexor tendon sheath, and removal of nail plate  . Tubal ligation Bilateral   . I&d extremity Right 04/13/2015    Procedure: RIGHT LONG FINGER DEBRIDEMENT AND FLEXOR TENDON SHEATH DRAINAGE;  Surgeon: Milly Jakob, MD;  Location: [facility];  Service: Orthopedics;  Laterality: Right;    There were no vitals filed for this visit.  Visit Diagnosis:  Pain in finger of right  hand  Stiffness of joint, hand, right  Weakness of right hand  Lack of coordination      Subjective Assessment - 04/26/15 1106    Subjective  I am doing much better! I wanted to surprise you   Limitations LUE IV antibiotics, decreased pain tolerance   Repetition Increases Symptoms   Patient Stated Goals to get my fingers back to normal   Currently in Pain? Yes   Pain Score --  0/10 at rest, 6/10 with A/ROM, 10/10 with P/ROM   Pain Location Finger (Comment which one)  LONG   Pain Orientation Right   Pain Descriptors / Indicators Sore   Pain Type Acute pain;Surgical pain   Pain Onset In the past 7 days   Pain Frequency Intermittent   Aggravating Factors  P/ROM   Pain Relieving Factors  pain meds, rest            [facility] OT Assessment - 04/26/15 0001    Coordination   9 Hole Peg Test Right;Left   Right 9 Hole Peg Test 37.18 sec   Left 9 Hole Peg Test 22.63 sec   AROM   Overall AROM Comments Rt long MP flex = 90*, ext = -40*. Rt long PIP flex = 60*, ext = -45*   Hand Function   Right Hand Grip (lbs) less than 5 lbs  Left Hand Grip (lbs) 70 lbs                  OT Treatments/Exercises (OP) - 04/26/15 0001    ADLs   ADL Comments Pt arrived w/o any gauze/dressing over Rt long finger nail bed. Pt reports cleaning daily and now pt can tolerate much more. Nail bed has dried with dried blood only, no xeroform necessary. However, did encourage pt to still cover with clean finger stockinette daily when out for hygiene purposes. Assessed coordination, grip strength and re-assessed ROM (SEE assessement). Pt with improved MP extension. Pt with limited changes in PIP joint. Pt provided with extra finger stockinette, but also could tolerate finger tubular compression sleeve today to assist with edema management. Applied finger tubular compression sleeve and provided pt with extra. Pt also encouraged to continue with scar massage with increased pressure at palmer incision to  assist with scar management and greater MP extension (noted mild tendon shortening)   Hand Exercises   Other Hand Exercises Focused on MP extension (A/ROM and P/ROM),  as well as blocking MP joint in extension with focus on PIP flexion and extension (A/ROM and P/ROM). Emphasized the importance of each exercise and being diligent and agressive with these ex's at home. (Pt provided with pre-fab finger blocking splint to hold MP in extension while working on PIP motion)   Splinting   Splinting Began fabrication of pm extension splint (for MP and IP extension). Will finish next session                  OT Short Term Goals - 04/26/15 1306    OT SHORT TERM GOAL #1   Title Independent with dressing changes    Baseline Pt shown by therapist during eval, but decreased tolerance. Nurse has performed at home last 2 times   Status Achieved   OT SHORT TERM GOAL #2   Title Independent with initial HEP   Baseline issued at eval   Status Achieved           OT Long Term Goals - 04/19/15 1136    OT LONG TERM GOAL #1   Title Pt independent with updated HEP   Time --  AS ABLE - MCD will not cover   Status New   OT LONG TERM GOAL #2   Title Pain less than or equal to 5/10 with dressing changes and ROM    Baseline 10/10   Status New   OT LONG TERM GOAL #3   Title Pt to return to consistently eating and writing with Rt dominant hand   Baseline only 25% or less eating, no writing (100% grooming with difficulty/extra time)   Status New   OT LONG TERM GOAL #4   Title Pt to demo gross composite finger flexion and extension to 80% or greater   Status New               Plan - 04/26/15 1306    Clinical Impression Statement Pt met STG's today. Pt now independent with dressing changes and nail bed much improved with only dried blood. Pt with increased tolerance to touch and motion today.    Plan finish pm extension splint, continue A/ROM and P/ROM aggressively   Consulted and Agree  with Plan of Care Patient        Problem List Patient Active Problem List   Diagnosis Date Noted  . Acute cystitis without hematuria   . Tenosynovitis of finger 04/15/2015  .  Osteomyelitis of finger   . Iron deficiency anemia 04/13/2015  . Encounter for sterilization 12/02/2013  . Contraception management 10/30/2013  . Hepatitis B surface antigen positive 06/26/2012  . 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 Thrombocytopenia 04/05/2011  . Abnormal Pap smear of cervix 04/05/2011  . Hx LEEP (loop electrosurgical excision procedure), cervix, pregnancy 04/05/2011    Carey Bullocks, OTR/L 04/26/2015, 1:09 PM  [facility] [address] [place], [place], [address] Phone: [phone]   Fax:  [phone]

## 2015-04-30 ENCOUNTER — Encounter: Payer: Self-pay | Admitting: General Practice

## 2015-04-30 ENCOUNTER — Ambulatory Visit: Payer: Medicaid Other | Admitting: Internal Medicine

## 2015-05-03 ENCOUNTER — Emergency Department (HOSPITAL_COMMUNITY)
Admission: EM | Admit: 2015-05-03 | Discharge: 2015-05-03 | Discharge status: Against Medical Advice | Payer: Medicaid Other | Attending: Emergency Medicine | Admitting: Emergency Medicine

## 2015-05-03 ENCOUNTER — Ambulatory Visit: Payer: Medicaid Other | Admitting: Occupational Therapy

## 2015-05-03 ENCOUNTER — Encounter (HOSPITAL_COMMUNITY): Payer: Self-pay | Admitting: Cardiology

## 2015-05-03 ENCOUNTER — Emergency Department (HOSPITAL_COMMUNITY)
Admission: EM | Admit: 2015-05-03 | Discharge: 2015-05-03 | Disposition: A | Discharge status: Home / Self Care | Payer: Medicaid Other | Source: Home / Self Care | Attending: Emergency Medicine | Admitting: Emergency Medicine

## 2015-05-03 DIAGNOSIS — Z8659 Personal history of other mental and behavioral disorders: Secondary | ICD-10-CM | POA: Insufficient documentation

## 2015-05-03 DIAGNOSIS — Y742 Prosthetic and other implants, materials and accessory general hospital and personal-use devices associated with adverse incidents: Secondary | ICD-10-CM | POA: Diagnosis not present

## 2015-05-03 DIAGNOSIS — Z8614 Personal history of Methicillin resistant Staphylococcus aureus infection: Secondary | ICD-10-CM

## 2015-05-03 DIAGNOSIS — Z791 Long term (current) use of non-steroidal anti-inflammatories (NSAID): Secondary | ICD-10-CM | POA: Insufficient documentation

## 2015-05-03 DIAGNOSIS — Y812 Prosthetic and other implants, materials and accessory general- and plastic-surgery devices associated with adverse incidents: Secondary | ICD-10-CM

## 2015-05-03 DIAGNOSIS — Z792 Long term (current) use of antibiotics: Secondary | ICD-10-CM

## 2015-05-03 DIAGNOSIS — Z8619 Personal history of other infectious and parasitic diseases: Secondary | ICD-10-CM | POA: Insufficient documentation

## 2015-05-03 DIAGNOSIS — T82838A Hemorrhage of vascular prosthetic devices, implants and grafts, initial encounter: Secondary | ICD-10-CM

## 2015-05-03 DIAGNOSIS — M79602 Pain in left arm: Secondary | ICD-10-CM | POA: Insufficient documentation

## 2015-05-03 DIAGNOSIS — Z79899 Other long term (current) drug therapy: Secondary | ICD-10-CM

## 2015-05-03 DIAGNOSIS — D509 Iron deficiency anemia, unspecified: Secondary | ICD-10-CM | POA: Insufficient documentation

## 2015-05-03 DIAGNOSIS — M79629 Pain in unspecified upper arm: Secondary | ICD-10-CM | POA: Diagnosis present

## 2015-05-03 NOTE — ED Provider Notes (Signed)
CSN: [id number]     Arrival date & time 05/03/15  1555 History   First MD Initiated Contact with Patient 05/03/15 1718     Chief Complaint  Patient presents with  . Arm Pain   HPI  [name] is a 29 year old female with recent PICC line insertion for osteomyelitis of right digit. Pt had PICC line inserted approximately 2 weeks ago for daily IV ceftriaxone which she administers at home. She states that she has had increasing soreness at the PICC site. She noted some blood oozing from the site earlier today. Denies other drainage or redness around the site. She has weekly wound nurse visits for blood draws; denies any issues with the last 2 visits. She was supposed to have a wound nurse visit today but cancelled it to come to the ED. Denies fevers, chills, diaphoresis, chest pain, SOB, nausea, vomiting, arm swelling, joint pain, numbness of the arm or weakness of the arm.  Past Medical History  Diagnosis Date  . Abnormal Pap smear   . STD (female)     chlamydia and trich  . Hx MRSA infection     History only - 2 Negative MRSA swabs in 2012  . SVD (spontaneous vaginal delivery)     x 5  . Depression     no meds   . Post-partum depression     hx  . Iron deficiency anemia    Past Surgical History  Procedure Laterality Date  . Cervical biopsy  w/ loop electrode excision    . Wisdom tooth extraction    . Laparoscopic tubal ligation Bilateral 12/02/2013    Procedure: LAPAROSCOPIC TUBAL LIGATION WITH FILSCHE CLIPS;  Surgeon: Adam Phenix, MD;  Location: [facility];  Service: Gynecology;  Laterality: Bilateral;  FILSCHE CLIPS  . Incision and drainage Right 04/13/2015    long finger excisional debridement of skin and subcutaneous tissues, with irrigation/drainage of the flexor tendon sheath, and removal of nail plate  . Tubal ligation Bilateral   . I&d extremity Right 04/13/2015    Procedure: RIGHT LONG FINGER DEBRIDEMENT AND FLEXOR TENDON SHEATH DRAINAGE;  Surgeon: Mack Hook, MD;  Location:  [facility];  Service: Orthopedics;  Laterality: Right;   Family History  Problem Relation Age of Onset  . Cancer Paternal Grandmother   . Hypertension Maternal Grandmother   . Heart disease Maternal Grandmother   . Diabetes Maternal Grandmother   . Stroke Maternal Grandmother   . Cancer Maternal Grandfather   . Hypertension Mother    Social History  Substance Use Topics  . Smoking status: Never Smoker   . Smokeless tobacco: Never Used  . Alcohol Use: Yes     Comment: 04/13/2015 "stopped drinking in 03/2014"   OB History    Gravida Para Term Preterm AB TAB SAB Ectopic Multiple Living   0 0 0 0 0 1 5     Review of Systems  Constitutional: Negative for fever, chills and diaphoresis.  Respiratory: Negative for shortness of breath.   Cardiovascular: Negative for chest pain.  Gastrointestinal: Negative for nausea and vomiting.  Musculoskeletal: Positive for myalgias. Negative for joint swelling and arthralgias.  Skin: Positive for wound.  Neurological: Negative for weakness, light-headedness and numbness.      Allergies  Review of patient's allergies indicates no known allergies.  Home Medications   Prior to Admission medications   Medication Sig Start Date End Date Taking? Authorizing Provider  cefTRIAXone 2 g in dextrose 5 % 50  mL Inject 2 g into the vein daily. 04/16/15  Yes Valentino Nose, MD  ferrous sulfate 325 (65 FE) MG tablet Take 1 tablet (325 mg total) by mouth daily with breakfast. 04/16/15  Yes Valentino Nose, MD  HYDROcodone-acetaminophen (NORCO/VICODIN) 5-325 MG per tablet Take 1 tablet by mouth every 4 (four) hours as needed for moderate pain. 04/16/15  Yes Valentino Nose, MD  meloxicam (MOBIC) 15 MG tablet Take 1 tablet (15 mg total) by mouth daily. 04/16/15  Yes Mack Hook, MD  ondansetron (ZOFRAN) 4 MG tablet Take 1 tablet (4 mg total) by mouth every 6 (six) hours as needed for nausea. 04/16/15  Yes Valentino Nose, MD  meloxicam (MOBIC) 15 MG tablet Take 1 tablet  (15 mg total) by mouth daily. 04/16/15   Valentino Nose, MD   BP 118/78 mmHg  Pulse 78  Temp(Src) 98.1 F (36.7 C) (Oral)  Resp 16  Wt 146 lb 11.2 oz (66.543 kg)  SpO2 100%  LMP 04/06/2015 Physical Exam  Constitutional: She appears well-developed and well-nourished. No distress.  HENT:  Head: Normocephalic and atraumatic.  Eyes: Conjunctivae are normal. Right eye exhibits no discharge. Left eye exhibits no discharge. No scleral icterus.  Neck: Normal range of motion.  Cardiovascular: Normal rate and regular rhythm.   Pulmonary/Chest: Effort normal and breath sounds normal. No respiratory distress. She has no wheezes. She has no rales.  Musculoskeletal: Normal range of motion.  PICC line of left inner arm in place and covered. Small amount of dried blood at the site with no active bleeding. No purulent drainage. No erythema or streaking from the site. No swelling of the left arm. FROM of left wrist, elbow and shoulder. TTP over wound site  Neurological: She is alert. Coordination normal.  5/5 grip and bicep strength of left arm. Sensation to light touch intact.   Skin: Skin is warm and dry.  Psychiatric: She has a normal mood and affect. Her behavior is normal.  Nursing note and vitals reviewed.   ED Course  Procedures (including critical care time) Labs Review Labs Reviewed - No data to display  Imaging Review No results found. I have personally reviewed and evaluated these images and lab results as part of my medical decision-making.   EKG Interpretation None      MDM   Final diagnoses:  Bleeding from PICC line, initial encounter  Pain of left upper extremity   Pt presenting with pain and bleeding from her PICC site as described above. Reports compliance with her antibiotic regimen. Denies fevers, chills, purulent drainage or erythema from the PICC site. VSS in ED. Pt nontoxic appearing. PICC sign is clean and covered. Small amount of dried blood surrounding the site  with no active bleeding. No purulence or erythema. Mildly TTP over PICC site. No swelling of the left arm. FROM of left shoulder, elbow and wrist intact. No signs of infection. Instructed pt to continue abx regimen and reschedule her wound care visit that she missed today. Pt seen in conjunction with Dr. Adriana Simas who agrees with assessment and plan. Return precautions given in discharge paperwork and discussed with pt at bedside. Pt stable for discharge     Brittany Heimlich, PA-C 05/03/15 1909  Donnetta Hutching, MD 05/04/15 [phone]

## 2015-05-03 NOTE — ED Notes (Signed)
Pt recently triaged for the same complaint but reports she had to leave and come back. Reports bleeding around the site where her PICC line is in place.

## 2015-05-03 NOTE — ED Notes (Signed)
Called patient for triage x 3. No answer. 

## 2015-05-03 NOTE — ED Notes (Signed)
Pt reports she had a PICC line placed about 6 weeks ago. States she is getting antibiotics through the line. Reports pain at the site. Small amount of blood noted at the insertion site.

## 2015-05-03 NOTE — Discharge Instructions (Signed)
- Continue your antibiotic regimen as prescribed by your orthopedic doctor - Call your wound nurse to reschedule the visit you missed today   PICC Home Guide A peripherally inserted central catheter (PICC) is a long, thin, flexible tube that is inserted into a vein in the upper arm. It is a form of intravenous (IV) access. It is considered to be a "central" line because the tip of the PICC ends in a large vein in your chest. This large vein is called the superior vena cava (SVC). The PICC tip ends in the SVC because there is a lot of blood flow in the SVC. This allows medicines and IV fluids to be quickly distributed throughout the body. The PICC is inserted using a sterile technique by a specially trained nurse or physician. After the PICC is inserted, a chest X-ray exam is done to be sure it is in the correct place.  A PICC may be placed for different reasons, such as:  To give medicines and liquid nutrition that can only be given through a central line. Examples are:  Certain antibiotic treatments.  Chemotherapy.  Total parenteral nutrition (TPN).  To take frequent blood samples.  To give IV fluids and blood products.  If there is difficulty placing a peripheral intravenous (PIV) catheter. If taken care of properly, a PICC can remain in place for several months. A PICC can also allow a person to go home from the hospital early. Medicine and PICC care can be managed at home by a family member or home health care team. WHAT PROBLEMS CAN HAPPEN WHEN I HAVE A PICC? Problems with a PICC can occasionally occur. These may include the following:  A blood clot (thrombus) forming in or at the tip of the PICC. This can cause the PICC to become clogged. A clot-dissolving medicine called tissue plasminogen activator (tPA) can be given through the PICC to help break up the clot.  Inflammation of the vein (phlebitis) in which the PICC is placed. Signs of inflammation may include redness, pain at the  insertion site, red streaks, or being able to feel a "cord" in the vein where the PICC is located.  Infection in the PICC or at the insertion site. Signs of infection may include fever, chills, redness, swelling, or pus drainage from the PICC insertion site.  PICC movement (malposition). The PICC tip may move from its original position due to excessive physical activity, forceful coughing, sneezing, or vomiting.  A break or cut in the PICC. It is important to not use scissors near the PICC.  Nerve or tendon irritation or injury during PICC insertion. WHAT SHOULD I KEEP IN MIND ABOUT ACTIVITIES WHEN I HAVE A PICC?  You may bend your arm and move it freely. If your PICC is near or at the bend of your elbow, avoid activity with repeated motion at the elbow.  Rest at home for the remainder of the day following PICC line insertion.  Avoid lifting heavy objects as instructed by your health care provider.  Avoid using a crutch with the arm on the same side as your PICC. You may need to use a walker. WHAT SHOULD I KNOW ABOUT MY PICC DRESSING?  Keep your PICC bandage (dressing) clean and dry to prevent infection.  Ask your health care provider when you may shower. Ask your health care provider to teach you how to wrap the PICC when you do take a shower.  Change the PICC dressing as instructed by your health care  provider.  Change your PICC dressing if it becomes loose or wet. WHAT SHOULD I KNOW ABOUT PICC CARE?  Check the PICC insertion site daily for leakage, redness, swelling, or pain.  Do not take a bath, swim, or use hot tubs when you have a PICC. Cover PICC line with clear plastic wrap and tape to keep it dry while showering.  Flush the PICC as directed by your health care provider. Let your health care provider know right away if the PICC is difficult to flush or does not flush. Do not use force to flush the PICC.  Do not use a syringe that is less than 10 mL to flush the  PICC.  Never pull or tug on the PICC.  Avoid blood pressure checks on the arm with the PICC.  Keep your PICC identification card with you at all times.  Do not take the PICC out yourself. Only a trained clinical professional should remove the PICC. SEEK IMMEDIATE MEDICAL CARE IF:  Your PICC is accidentally pulled all the way out. If this happens, cover the insertion site with a bandage or gauze dressing. Do not throw the PICC away. Your health care provider will need to inspect it.  Your PICC was tugged or pulled and has partially come out. Do not  push the PICC back in.  There is any type of drainage, redness, or swelling where the PICC enters the skin.  You cannot flush the PICC, it is difficult to flush, or the PICC leaks around the insertion site when it is flushed.  You hear a "flushing" sound when the PICC is flushed.  You have pain, discomfort, or numbness in your arm, shoulder, or jaw on the same side as the PICC.  You feel your heart "racing" or skipping beats.  You notice a hole or tear in the PICC.  You develop chills or a fever. MAKE SURE YOU:   Understand these instructions.  Will watch your condition.  Will get help right away if you are not doing well or get worse. Document Released: 01/28/2003 Document Revised: 12/08/2013 Document Reviewed: 03/31/2013 [facility] Patient Information 2015 [place], [place]. This information is not intended to replace advice given to you by your health care provider. Make sure you discuss any questions you have with your health care provider.

## 2015-05-04 ENCOUNTER — Ambulatory Visit: Payer: Medicaid Other | Admitting: Occupational Therapy

## 2015-05-04 DIAGNOSIS — R279 Unspecified lack of coordination: Secondary | ICD-10-CM

## 2015-05-04 DIAGNOSIS — M79644 Pain in right finger(s): Secondary | ICD-10-CM

## 2015-05-04 DIAGNOSIS — M25641 Stiffness of right hand, not elsewhere classified: Secondary | ICD-10-CM

## 2015-05-04 DIAGNOSIS — R29898 Other symptoms and signs involving the musculoskeletal system: Secondary | ICD-10-CM

## 2015-05-04 NOTE — Patient Instructions (Signed)
WEARING SCHEDULE:  Wear your splint at nighttime only, make sure to perform exercises before applying. Today wear splint for 1-2 hours while at rest to ensure no problems before wearing over night. If you have pressure areas or redness from the splint stop wearing it and bring to your next appointment.   CARE OF SPLINT:  Keep splint away from heat sources including: stove, radiator or furnace, or a car in sunlight. The splint can melt and will no longer fit you properly  Keep away from pets and children  Clean the splint with rubbing alcohol 1-2 times per day.  PRECAUTIONS/POTENTIAL PROBLEMS: *If you notice or experience increased pain, swelling, numbness, or a lingering reddened area from the splint: Contact your therapist immediately by calling [phone]. You must wear the splint for protection, but we will get you scheduled for adjustments as quickly as possible.  (If only straps or hooks need to be replaced and NO adjustments to the splint need to be made, just call the office ahead and let them know you are coming in)  If you have any medical concerns or signs of infection, please call your doctor immediately

## 2015-05-04 NOTE — Therapy (Signed)
[facility] [address] [place], [place], [address] Phone: [phone]   Fax:  [phone]  Occupational Therapy Treatment  Patient Details  Name: [name] MRN: [id number] Date of Birth: [birth year] Referring Provider:  Shelly Bombard, MD  Encounter Date: 05/04/2015      OT End of Session - 05/04/15 1724    Visit Number 3   Number of Visits 8   Authorization Type MCD - not a qualifying diagnosis for treatment   Authorization - Visit Number 3   Authorization - Number of Visits 6   OT Start Time 0935   OT Stop Time 1015   OT Time Calculation (min) 40 min      Past Medical History  Diagnosis Date  . Abnormal Pap smear   . STD (female)     chlamydia and trich  . Hx MRSA infection     History only - 2 Negative MRSA swabs in 2012  . SVD (spontaneous vaginal delivery)     x 5  . Depression     no meds   . Post-partum depression     hx  . Iron deficiency anemia     Past Surgical History  Procedure Laterality Date  . Cervical biopsy  w/ loop electrode excision    . Wisdom tooth extraction    . Laparoscopic tubal ligation Bilateral 12/02/2013    Procedure: LAPAROSCOPIC TUBAL LIGATION WITH FILSCHE CLIPS;  Surgeon: Adam Phenix, MD;  Location: [facility];  Service: Gynecology;  Laterality: Bilateral;  FILSCHE CLIPS  . Incision and drainage Right 04/13/2015    long finger excisional debridement of skin and subcutaneous tissues, with irrigation/drainage of the flexor tendon sheath, and removal of nail plate  . Tubal ligation Bilateral   . I&d extremity Right 04/13/2015    Procedure: RIGHT LONG FINGER DEBRIDEMENT AND FLEXOR TENDON SHEATH DRAINAGE;  Surgeon: Mack Hook, MD;  Location: [facility];  Service: Orthopedics;  Laterality: Right;    There were no vitals filed for this visit.  Visit Diagnosis:  Pain in finger of right hand  Stiffness of joint, hand, right  Weakness of right hand  Lack of  coordination      Subjective Assessment - 05/04/15 1721    Limitations LUE IV antibiotics, decreased pain tolerance   Patient Stated Goals to get my fingers back to normal   Currently in Pain? Yes   Pain Score 8    Pain Location Hand   Pain Orientation Right   Pain Descriptors / Indicators Aching   Pain Onset 1 to 4 weeks ago   Pain Frequency Intermittent   Aggravating Factors  movement   Pain Relieving Factors pain meds, rest   Multiple Pain Sites No         Treatment: Pt arrived with middle finger bandaged. Therapist unwrapped carefully with pt assistance and dressed with finger stockinette. Pt's nailbed has dried blood present and appears to be healing. Gentle P/ROM stretch to middle finger in extension, pt was limited by pain and crying out. A second therapist familiar with pt came in and perfored gentle P/ROM stretch to middle finger in extension, while therapist initated fabrication of night time splint. Pt was fitted with a night time extension splint for right middle finger and educated in splint wear, care and precautions. Pt performed self ROM A/ROM and P/ROM to right middle finger while therapist added strapping to splint. Pt reported the splint fit well at end of session.  OT Short Term Goals - 04/26/15 1306    OT SHORT TERM GOAL #1   Title Independent with dressing changes    Baseline Pt shown by therapist during eval, but decreased tolerance. Nurse has performed at home last 2 times   Status Achieved   OT SHORT TERM GOAL #2   Title Independent with initial HEP   Baseline issued at eval   Status Achieved           OT Long Term Goals - 04/19/15 1136    OT LONG TERM GOAL #1   Title Pt independent with updated HEP   Time --  AS ABLE - MCD will not cover   Status New   OT LONG TERM GOAL #2   Title Pain less than or equal to 5/10 with dressing changes and ROM    Baseline 10/10   Status New   OT LONG TERM GOAL #3    Title Pt to return to consistently eating and writing with Rt dominant hand   Baseline only 25% or less eating, no writing (100% grooming with difficulty/extra time)   Status New   OT LONG TERM GOAL #4   Title Pt to demo gross composite finger flexion and extension to 80% or greater   Status New               Plan - 05/04/15 1722    Clinical Impression Statement Pt is progressing slowly towards goals limited by pain. Pt's nailbed is healing with dried blood present.   Pt will benefit from skilled therapeutic intervention in order to improve on the following deficits (Retired) Pain;Impaired flexibility;Increased edema;Decreased range of motion   Rehab Potential Fair   Clinical Impairments Affecting Rehab Potential decreased pain tolerance/sensitivty, decreased financial resources limiting therapy visits   OT Frequency 1x / week   OT Duration 8 weeks   OT Treatment/Interventions Self-care/ADL training;Therapeutic exercise;Patient/family education;Splinting;Manual Therapy;Compression bandaging;DME and/or AE instruction;Therapeutic activities;Nature conservation officer;Contrast Bath;Passive range of motion   Plan check extension splint wear, care, continue ROM, anticipate d/c next wvisit    Consulted and Agree with Plan of Care Patient        Problem List Patient Active Problem List   Diagnosis Date Noted  . Acute cystitis without hematuria   . Tenosynovitis of finger 04/15/2015  . Osteomyelitis of finger   . Iron deficiency anemia 04/13/2015  . Encounter for sterilization 12/02/2013  . Contraception management 10/30/2013  . Hepatitis B surface antigen positive 06/26/2012  . 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 Thrombocytopenia 04/05/2011  . Abnormal Pap smear of cervix 04/05/2011  . Hx LEEP (loop electrosurgical excision procedure), cervix, pregnancy 04/05/2011    RINE,KATHRYN 05/04/2015, 5:26  PM  [facility] [address] [place], [place], [address] Phone: [phone]   Fax:  [phone]

## 2015-05-11 ENCOUNTER — Inpatient Hospital Stay: Payer: Medicaid Other | Admitting: Infectious Diseases

## 2015-05-19 ENCOUNTER — Telehealth: Payer: Self-pay | Admitting: *Deleted
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
## 2015-05-20 ENCOUNTER — Ambulatory Visit: Payer: Medicaid Other | Attending: Internal Medicine | Admitting: Occupational Therapy

## 2015-05-26 ENCOUNTER — Inpatient Hospital Stay: Payer: Medicaid Other | Admitting: Infectious Diseases

## 2015-05-26 ENCOUNTER — Telehealth: Payer: Self-pay

## 2015-05-26 NOTE — Telephone Encounter (Signed)
[place] with [facility] [phone])  Dorina HoyerJoanie reporting that they have only seen patient for 1 out of 4 scheduled visits for picc line care and lab draws (CBC w/diff & BUN & creat).   Pt is not keeping appointments, and is not rescheduling.   They want us to be aware. 10/10 was the last successful visit, they started visits 9/18. Please advise.

## 2015-05-26 NOTE — Telephone Encounter (Signed)
I tried calling [name] to provide her with some education as to the importance of PICC line care and potential risks if she does not commit to this care.  I received a voice message.  I left a message asking her to call the clinic.  The attending in the clinic can call her to educate her on this when an appropriate number at which she will answer is obtained.
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
## 2015-05-27 NOTE — Telephone Encounter (Signed)
Spoke with Dorina HoyerJoanie, they will put patient back on their visit list starting Monday.

## 2015-05-27 NOTE — Telephone Encounter (Signed)
Called Joanie, left msg to return call

## 2015-05-31 ENCOUNTER — Telehealth: Payer: Self-pay

## 2015-05-31 NOTE — Telephone Encounter (Signed)
Received report from CBC w/diff and BUN/Creatinine done 10/21 Given to Dr. Earlene PlaterWallace for review

## 2015-06-08 ENCOUNTER — Ambulatory Visit: Payer: Medicaid Other | Admitting: Internal Medicine

## 2015-06-08 ENCOUNTER — Telehealth: Payer: Self-pay | Admitting: Internal Medicine

## 2015-06-08 NOTE — Telephone Encounter (Signed)
Ms. [name] was a no show for her appointment with me today. I tried calling her today after her missed appointment but went to voice mail. I left her a message emphasizing the importance of us seeing her with her PICC line. She should be finished with her antibiotic course at this point if she has been getting her treatments but she has not followed up with us to date so that is unclear.

## 2015-06-14 ENCOUNTER — Telehealth: Payer: Self-pay | Admitting: Internal Medicine

## 2015-06-14 NOTE — Telephone Encounter (Signed)
RECEIVED PHONE CALL FROM [facility] PATIENT BEING DISCHARGED SHE WAS NON/COMPL.

## 2015-06-21 ENCOUNTER — Ambulatory Visit: Payer: Medicaid Other | Admitting: Internal Medicine

## 2015-06-22 ENCOUNTER — Emergency Department (HOSPITAL_COMMUNITY)
Admission: EM | Admit: 2015-06-22 | Discharge: 2015-06-22 | Disposition: A | Discharge status: Home / Self Care | Payer: Medicaid Other | Attending: Emergency Medicine | Admitting: Emergency Medicine

## 2015-06-22 ENCOUNTER — Encounter (HOSPITAL_COMMUNITY): Payer: Self-pay

## 2015-06-22 DIAGNOSIS — Z791 Long term (current) use of non-steroidal anti-inflammatories (NSAID): Secondary | ICD-10-CM | POA: Diagnosis not present

## 2015-06-22 DIAGNOSIS — D509 Iron deficiency anemia, unspecified: Secondary | ICD-10-CM | POA: Insufficient documentation

## 2015-06-22 DIAGNOSIS — Z8619 Personal history of other infectious and parasitic diseases: Secondary | ICD-10-CM | POA: Insufficient documentation

## 2015-06-22 DIAGNOSIS — Z8659 Personal history of other mental and behavioral disorders: Secondary | ICD-10-CM | POA: Insufficient documentation

## 2015-06-22 DIAGNOSIS — Z452 Encounter for adjustment and management of vascular access device: Secondary | ICD-10-CM | POA: Insufficient documentation

## 2015-06-22 DIAGNOSIS — Z79899 Other long term (current) drug therapy: Secondary | ICD-10-CM | POA: Diagnosis not present

## 2015-06-22 DIAGNOSIS — Z8614 Personal history of Methicillin resistant Staphylococcus aureus infection: Secondary | ICD-10-CM | POA: Insufficient documentation

## 2015-06-22 DIAGNOSIS — Z792 Long term (current) use of antibiotics: Secondary | ICD-10-CM | POA: Diagnosis not present

## 2015-06-22 NOTE — Discharge Instructions (Signed)
PICC Removal A peripherally inserted central catheter (PICC) is a long, thin, flexible tube that a health care provider can insert into a vein in your upper arm. It is a type of IV. Having a PICC in place gives health care providers quick access to your veins. It is a good way to distribute medicines and fluids quickly throughout your body. LET YOUR HEALTH CARE PROVIDER KNOW ABOUT:  Any allergies you have.  All medicines you are taking, including vitamins, herbs, eye drops, creams, and over-the-counter medicines.  Previous problems you or members of your family have had with the use of anesthetics.  Any blood disorders you have.  Previous surgeries you have had.  Medical conditions you have. RISKS AND COMPLICATIONS Generally, this is a safe procedure. However, as with any procedure, problems can occur. Possible problems include:  Bleeding.  Infection. BEFORE THE PROCEDURE You need an order from your health care provider to have your PICC removed. Only a health care provider trained in PICC removal should take it out.You may have your PICC removed in the hospital or in an outpatient setting. PROCEDURE Having a PICC removed is usually painless. Removal of the tape that holds the PICC in place may be the most uncomfortable part. Do not take out the PICC yourself. Only a trained clinical professional, such as a PICC nurse, should remove it. If your health care provider thinks your PICC is infected, the tip may be sent to the lab for testing. After taking out your PICC, your health care provider may:   Hold gentle pressure on the exit site.  Apply some antibiotic ointment.  Place a small bandage over the insertion site. AFTER THE PROCEDURE You should be able to remove the bandage after 24 hours. Follow all your health care provider's instructions.   Keep the insertion site clean by washing it gently with soap and water.  Do not pick or remove a scab.  Avoid strenuous physical  activity for a day or two.  Let your health care provider know if you develop redness, soreness, bleeding, swelling, or drainage from the insertion site.  Let your health care provider know if you develop chills or fever.   This information is not intended to replace advice given to you by your health care provider. Make sure you discuss any questions you have with your health care provider.   Document Released: 01/11/2010 Document Revised: 12/08/2014 Document Reviewed: 05/16/2013 Elsevier Interactive Patient Education 2016 Elsevier Inc.  

## 2015-06-22 NOTE — ED Provider Notes (Signed)
CSN: [id number]     Arrival date & time 06/22/15  0941 History   First MD Initiated Contact with Patient 06/22/15 1015     Chief Complaint  Patient presents with  . Vascular Access Problem     (Consider location/radiation/quality/duration/timing/severity/associated sxs/prior Treatment) HPI Comments: 29 year old female presents with PICC line in her right arm that was placed for a recent infection and prolonged antibiotic use. She refused to come back to have it removed as scheduled because of ongoing stressors in her life. She has had no fevers, vomiting, swelling of the extremity. She explains that she did not want to come for follow-up but knew that she was wrong to not come back and have her PICC line removed as it could make her very sick.  The history is provided by the patient.    Past Medical History  Diagnosis Date  . Abnormal Pap smear   . STD (female)     chlamydia and trich  . Hx MRSA infection     History only - 2 Negative MRSA swabs in 2012  . SVD (spontaneous vaginal delivery)     x 5  . Depression     no meds   . Post-partum depression     hx  . Iron deficiency anemia    Past Surgical History  Procedure Laterality Date  . Cervical biopsy  w/ loop electrode excision    . Wisdom tooth extraction    . Laparoscopic tubal ligation Bilateral 12/02/2013    Procedure: LAPAROSCOPIC TUBAL LIGATION WITH FILSCHE CLIPS;  Surgeon: Adam Phenix, MD;  Location: [facility];  Service: Gynecology;  Laterality: Bilateral;  FILSCHE CLIPS  . Incision and drainage Right 04/13/2015    long finger excisional debridement of skin and subcutaneous tissues, with irrigation/drainage of the flexor tendon sheath, and removal of nail plate  . Tubal ligation Bilateral   . I&d extremity Right 04/13/2015    Procedure: RIGHT LONG FINGER DEBRIDEMENT AND FLEXOR TENDON SHEATH DRAINAGE;  Surgeon: Mack Hook, MD;  Location: [facility] OR;  Service: Orthopedics;  Laterality: Right;  . Finger nail removed      Family History  Problem Relation Age of Onset  . Cancer Paternal Grandmother   . Hypertension Maternal Grandmother   . Heart disease Maternal Grandmother   . Diabetes Maternal Grandmother   . Stroke Maternal Grandmother   . Cancer Maternal Grandfather   . Hypertension Mother    Social History  Substance Use Topics  . Smoking status: Never Smoker   . Smokeless tobacco: Never Used  . Alcohol Use: Yes     Comment: 04/13/2015 "stopped drinking in 03/2014"   OB History    Gravida Para Term Preterm AB TAB SAB Ectopic Multiple Living   0 0 0 0 0 1 5     Review of Systems  All other systems reviewed and are negative.     Allergies  Review of patient's allergies indicates no known allergies.  Home Medications   Prior to Admission medications   Medication Sig Start Date End Date Taking? Authorizing Provider  cefTRIAXone 2 g in dextrose 5 % 50 mL Inject 2 g into the vein daily. 04/16/15   Valentino Nose, MD  ferrous sulfate 325 (65 FE) MG tablet Take 1 tablet (325 mg total) by mouth daily with breakfast. 04/16/15   Valentino Nose, MD  HYDROcodone-acetaminophen (NORCO/VICODIN) 5-325 MG per tablet Take 1 tablet by mouth every 4 (four) hours as needed for moderate pain. 04/16/15  Valentino NoseNathan Boswell, MD  meloxicam (MOBIC) 15 MG tablet Take 1 tablet (15 mg total) by mouth daily. 04/16/15   Mack Hookavid Thompson, MD  meloxicam (MOBIC) 15 MG tablet Take 1 tablet (15 mg total) by mouth daily. 04/16/15   Valentino NoseNathan Boswell, MD  ondansetron (ZOFRAN) 4 MG tablet Take 1 tablet (4 mg total) by mouth every 6 (six) hours as needed for nausea. 04/16/15   Valentino NoseNathan Boswell, MD   BP 117/88 mmHg  Pulse 68  Temp(Src) 98.6 F (37 C) (Oral)  Resp 16  SpO2 97% Physical Exam  Constitutional: She is oriented to person, place, and time. She appears well-developed and well-nourished. No distress.  HENT:  Head: Normocephalic.  Eyes: Conjunctivae are normal.  Neck: Neck supple. No tracheal deviation present.   Cardiovascular: Normal rate and regular rhythm.   Pulmonary/Chest: Effort normal. No respiratory distress.  Abdominal: Soft. She exhibits no distension.  Musculoskeletal:  PICC line in place in the right decubital area with scant foul serous drainage and unchanged surgical tape. No swelling, erythema, induration, fluctuance, or purulent drainage.  Neurological: She is alert and oriented to person, place, and time.  Skin: Skin is warm and dry.  Psychiatric: She has a normal mood and affect.    ED Course  Procedures (including critical care time) Labs Review Labs Reviewed - No data to display  Imaging Review No results found. I have personally reviewed and evaluated these images and lab results as part of my medical decision-making.   EKG Interpretation None      MDM   Final diagnoses:  PICC (peripherally inserted central catheter) removal    29 year old female presents with noncompliance with PICC line removal. It is been in a month longer than it was supposed to be. There is some scant foul drainage from the insertion site with foul-smelling skin over unwashed arm but no evidence of erythema, induration, abscess. Patient is afebrile and otherwise well-appearing. PICC line was removed without complication. No indication for ABx therapy currently. Return precautions were discussed for worsening or new concerning symptoms.     Lyndal Pulleyaniel Lorenzo Arscott, MD 06/23/15 [phone]

## 2015-06-22 NOTE — ED Notes (Signed)
Per Pt, Pt was to get PICC line removed a month ago. Pt stopped antibiotics after six weeks and has not been using PICC line. Patient reports, "I have just been going through some things, and I have not been able to get it out. After my six weeks of antibiotics, the nurse stopped coming and I just had things come up." Pt denies pain, numbness, tingling, or any other symptoms.

## 2015-06-22 NOTE — ED Notes (Signed)
MD removed PICC line. Site cleaned with warm soap and water. Redressed. Serous drainage noted with no purulent drainage. Site not warm. Not mass noted. Pt educated on reason to return to the ED and verbalized understanding. Understands signs of infection.

## 2015-10-17 ENCOUNTER — Encounter (HOSPITAL_COMMUNITY): Payer: Self-pay | Admitting: Emergency Medicine

## 2015-10-17 ENCOUNTER — Emergency Department (HOSPITAL_COMMUNITY)
Admission: EM | Admit: 2015-10-17 | Discharge: 2015-10-17 | Disposition: A | Discharge status: Home / Self Care | Payer: Medicaid Other | Attending: Emergency Medicine | Admitting: Emergency Medicine

## 2015-10-17 DIAGNOSIS — Z8619 Personal history of other infectious and parasitic diseases: Secondary | ICD-10-CM | POA: Insufficient documentation

## 2015-10-17 DIAGNOSIS — J020 Streptococcal pharyngitis: Secondary | ICD-10-CM | POA: Diagnosis not present

## 2015-10-17 DIAGNOSIS — H9202 Otalgia, left ear: Secondary | ICD-10-CM | POA: Insufficient documentation

## 2015-10-17 DIAGNOSIS — Z79899 Other long term (current) drug therapy: Secondary | ICD-10-CM | POA: Insufficient documentation

## 2015-10-17 DIAGNOSIS — Z791 Long term (current) use of non-steroidal anti-inflammatories (NSAID): Secondary | ICD-10-CM | POA: Diagnosis not present

## 2015-10-17 DIAGNOSIS — Z8614 Personal history of Methicillin resistant Staphylococcus aureus infection: Secondary | ICD-10-CM | POA: Insufficient documentation

## 2015-10-17 DIAGNOSIS — Z8659 Personal history of other mental and behavioral disorders: Secondary | ICD-10-CM | POA: Diagnosis not present

## 2015-10-17 DIAGNOSIS — D509 Iron deficiency anemia, unspecified: Secondary | ICD-10-CM | POA: Insufficient documentation

## 2015-10-17 DIAGNOSIS — J029 Acute pharyngitis, unspecified: Secondary | ICD-10-CM | POA: Diagnosis present

## 2015-10-17 LAB — RAPID STREP SCREEN (MED CTR MEBANE ONLY): Streptococcus, Group A Screen (Direct): POSITIVE — AB

## 2015-10-17 MED ORDER — PENICILLIN G BENZATHINE 1200000 UNIT/2ML IM SUSP
1.2000 10*6.[IU] | Freq: Once | INTRAMUSCULAR | Status: AC
  Administered 2015-10-17: 1.2 10*6.[IU] via INTRAMUSCULAR
  Filled 2015-10-17: qty 2

## 2015-10-17 MED ORDER — DEXAMETHASONE SODIUM PHOSPHATE 10 MG/ML IJ SOLN
10.0000 mg | Freq: Once | INTRAMUSCULAR | Status: AC
  Administered 2015-10-17: 10 mg via INTRAMUSCULAR
  Filled 2015-10-17: qty 1

## 2015-10-17 NOTE — Discharge Instructions (Signed)

## 2015-10-17 NOTE — ED Notes (Signed)
Pt c/o left ear ache and sore throat onset yesterday. Pt reports pain occurs when she swallows.

## 2015-10-17 NOTE — ED Provider Notes (Signed)
CSN: [id number]     Arrival date & time 10/17/15  1635 History  By signing my name below, I, Evon Slack, attest that this documentation has been prepared under the direction and in the presence of Revin Corker, PA-C. Electronically Signed: Evon Slack, ED Scribe. 10/17/2015. 4:52 PM.      Chief Complaint  Patient presents with  . Otalgia  . Sore Throat   Patient is a 30 y.o. female presenting with pharyngitis. The history is provided by the patient. No language interpreter was used.  Sore Throat This is a new problem. The current episode started in the past 7 days. The problem occurs constantly. The problem has been gradually worsening. Associated symptoms include a sore throat. Pertinent negatives include no abdominal pain, chills, congestion, coughing, fever, headaches, nausea, swollen glands or vomiting. The symptoms are aggravated by drinking, eating and swallowing. She has tried acetaminophen and NSAIDs (chloraseptic spray) for the symptoms. The treatment provided no relief.   HPI Comments: [name] is a 30 y.o. female who presents to the Emergency Department complaining of sore throat onset 1 week prior. The pain worsened yesterday which prompted her ED visit. The pain is worsened with swallowing. She denies difficulty handling secretions, swallowing or breathing. She is also complaining of left ear pain. She states that her throat pain radiates into the ear causing it to ache. Denies loss of hearing or ear drainage. Pt states she has tried Nyquil and chloraseptic spray with no relief. Pt denies fever, chills, headache, dizziness, nasal congestion, rhinorrhea, cough, abdominal pain, nausea or vomiting. Pt does report possible sick contacts at her kids school.    Past Medical History  Diagnosis Date  . Abnormal Pap smear   . STD (female)     chlamydia and trich  . Hx MRSA infection     History only - 2 Negative MRSA swabs in 2012  . SVD (spontaneous vaginal delivery)     x  5  . Depression     no meds   . Post-partum depression     hx  . Iron deficiency anemia    Past Surgical History  Procedure Laterality Date  . Cervical biopsy  w/ loop electrode excision    . Wisdom tooth extraction    . Laparoscopic tubal ligation Bilateral 12/02/2013    Procedure: LAPAROSCOPIC TUBAL LIGATION WITH FILSCHE CLIPS;  Surgeon: Adam Phenix, MD;  Location: [facility];  Service: Gynecology;  Laterality: Bilateral;  FILSCHE CLIPS  . Incision and drainage Right 04/13/2015    long finger excisional debridement of skin and subcutaneous tissues, with irrigation/drainage of the flexor tendon sheath, and removal of nail plate  . Tubal ligation Bilateral   . I&d extremity Right 04/13/2015    Procedure: RIGHT LONG FINGER DEBRIDEMENT AND FLEXOR TENDON SHEATH DRAINAGE;  Surgeon: Mack Hook, MD;  Location: [facility];  Service: Orthopedics;  Laterality: Right;  . Finger nail removed     Family History  Problem Relation Age of Onset  . Cancer Paternal Grandmother   . Hypertension Maternal Grandmother   . Heart disease Maternal Grandmother   . Diabetes Maternal Grandmother   . Stroke Maternal Grandmother   . Cancer Maternal Grandfather   . Hypertension Mother    Social History  Substance Use Topics  . Smoking status: Never Smoker   . Smokeless tobacco: Never Used  . Alcohol Use: Yes     Comment: 04/13/2015 "stopped drinking in 03/2014"   OB History    Gravida Para Term  Preterm AB TAB SAB Ectopic Multiple Living   0 0 0 0 0 1 5     Review of Systems  Constitutional: Negative for fever and chills.  HENT: Positive for ear pain and sore throat. Negative for congestion.   Respiratory: Negative for cough.   Gastrointestinal: Negative for nausea, vomiting and abdominal pain.  Neurological: Negative for headaches.  All other systems reviewed and are negative.   Allergies  Review of patient's allergies indicates no known allergies.  Home Medications   Prior to Admission  medications   Medication Sig Start Date End Date Taking? Authorizing Provider  cefTRIAXone 2 g in dextrose 5 % 50 mL Inject 2 g into the vein daily. 04/16/15   Valentino Nose, MD  ferrous sulfate 325 (65 FE) MG tablet Take 1 tablet (325 mg total) by mouth daily with breakfast. 04/16/15   Valentino Nose, MD  HYDROcodone-acetaminophen (NORCO/VICODIN) 5-325 MG per tablet Take 1 tablet by mouth every 4 (four) hours as needed for moderate pain. 04/16/15   Valentino Nose, MD  meloxicam (MOBIC) 15 MG tablet Take 1 tablet (15 mg total) by mouth daily. 04/16/15   Mack Hook, MD  meloxicam (MOBIC) 15 MG tablet Take 1 tablet (15 mg total) by mouth daily. 04/16/15   Valentino Nose, MD  ondansetron (ZOFRAN) 4 MG tablet Take 1 tablet (4 mg total) by mouth every 6 (six) hours as needed for nausea. 04/16/15   Valentino Nose, MD   BP 119/80 mmHg  Pulse 97  Temp(Src) 98.4 F (36.9 C) (Oral)  Resp 18  Ht  (1.676 m)  Wt 61.944 kg  BMI 22.05 kg/m2  SpO2 100%  LMP 10/03/2015   Physical Exam  Constitutional: She is oriented to person, place, and time. She appears well-developed and well-nourished. No distress.  Nontoxic appearing  HENT:  Head: Normocephalic and atraumatic.  Right Ear: Tympanic membrane and ear canal normal.  Left Ear: Tympanic membrane and ear canal normal.  Mouth/Throat: Uvula is midline and mucous membranes are normal. Mucous membranes are not dry. No uvula swelling. Oropharyngeal exudate, posterior oropharyngeal edema and posterior oropharyngeal erythema present. No tonsillar abscesses.  Eyes: Conjunctivae and EOM are normal.  Neck: Normal range of motion. Neck supple. No tracheal deviation present.  Cardiovascular: Normal rate, regular rhythm and normal heart sounds.   Pulmonary/Chest: Effort normal and breath sounds normal. No respiratory distress.  Musculoskeletal: Normal range of motion.  Lymphadenopathy:    She has cervical adenopathy.  Neurological: She is alert and oriented to  person, place, and time.  Skin: Skin is warm and dry.  Psychiatric: She has a normal mood and affect. Her behavior is normal.  Nursing note and vitals reviewed.   ED Course  Procedures (including critical care time) DIAGNOSTIC STUDIES: Oxygen Saturation is 99% on RA, normal by my interpretation.    COORDINATION OF CARE: 5:00 PM-Discussed treatment plan with pt at bedside and pt agreed to plan.     Labs Review Labs Reviewed  RAPID STREP SCREEN (NOT AT [facility]) - Abnormal; Notable for the following:    Streptococcus, Group A Screen (Direct) POSITIVE (*)    All other components within normal limits    Imaging Review No results found..   EKG Interpretation None      MDM   Final diagnoses:  Strep pharyngitis   Patient presenting with signs and symptoms of strep pharyngitis. Oropharynx with tonsillar exudate and erythema. No uvular swelling or deviation. No trismus. No evidence of PTA  or spread of infection to the soft tissues of the neck. Patient handling secretions well and has no SOB or stridor. Rapid strep positive. Treated in ED with decadron and PCN IM. Pt able to tolerate PO in ED. Recommended to follow up with PCP if symptoms do not improve. Return precautions given in discharge paperwork and discussed with pt at bedside. Pt stable for discharge   I personally performed the services described in this documentation, which was scribed in my presence. The recorded information has been reviewed and is accurate.      Alveta HeimlichStevi Lya Holben, PA-C 10/17/15 1900  Gwyneth SproutWhitney Plunkett, MD 10/18/15 1423

## 2015-10-17 NOTE — ED Notes (Signed)
Declined W/C at D/C and was escorted to lobby by RN. 

## 2015-11-18 ENCOUNTER — Ambulatory Visit: Payer: Medicaid Other | Admitting: Obstetrics & Gynecology

## 2015-11-19 ENCOUNTER — Emergency Department (HOSPITAL_COMMUNITY)
Admission: EM | Admit: 2015-11-19 | Discharge: 2015-11-19 | Disposition: A | Discharge status: Home / Self Care | Payer: Medicaid Other | Attending: Emergency Medicine | Admitting: Emergency Medicine

## 2015-11-19 ENCOUNTER — Encounter (HOSPITAL_COMMUNITY): Payer: Self-pay

## 2015-11-19 DIAGNOSIS — D509 Iron deficiency anemia, unspecified: Secondary | ICD-10-CM | POA: Diagnosis not present

## 2015-11-19 DIAGNOSIS — Z79899 Other long term (current) drug therapy: Secondary | ICD-10-CM | POA: Diagnosis not present

## 2015-11-19 DIAGNOSIS — Z792 Long term (current) use of antibiotics: Secondary | ICD-10-CM | POA: Insufficient documentation

## 2015-11-19 DIAGNOSIS — Y9389 Activity, other specified: Secondary | ICD-10-CM | POA: Insufficient documentation

## 2015-11-19 DIAGNOSIS — S134XXA Sprain of ligaments of cervical spine, initial encounter: Secondary | ICD-10-CM | POA: Diagnosis not present

## 2015-11-19 DIAGNOSIS — Z8614 Personal history of Methicillin resistant Staphylococcus aureus infection: Secondary | ICD-10-CM | POA: Diagnosis not present

## 2015-11-19 DIAGNOSIS — Y998 Other external cause status: Secondary | ICD-10-CM | POA: Diagnosis not present

## 2015-11-19 DIAGNOSIS — Z8619 Personal history of other infectious and parasitic diseases: Secondary | ICD-10-CM | POA: Insufficient documentation

## 2015-11-19 DIAGNOSIS — S199XXA Unspecified injury of neck, initial encounter: Secondary | ICD-10-CM | POA: Diagnosis present

## 2015-11-19 DIAGNOSIS — Y92410 Unspecified street and highway as the place of occurrence of the external cause: Secondary | ICD-10-CM | POA: Insufficient documentation

## 2015-11-19 DIAGNOSIS — F329 Major depressive disorder, single episode, unspecified: Secondary | ICD-10-CM | POA: Insufficient documentation

## 2015-11-19 MED ORDER — IBUPROFEN 600 MG PO TABS: 600.0000 mg | ORAL_TABLET | Freq: Four times a day (QID) | ORAL | Status: DC | PRN

## 2015-11-19 MED ORDER — IBUPROFEN 400 MG PO TABS
600.0000 mg | ORAL_TABLET | Freq: Once | ORAL | Status: AC
  Administered 2015-11-19: 600 mg via ORAL
  Filled 2015-11-19: qty 1

## 2015-11-19 NOTE — ED Notes (Signed)
EDP at bedside  

## 2015-11-19 NOTE — Discharge Instructions (Signed)
Cervical Strain and Sprain With Rehab  Cervical strain and sprain are injuries that commonly occur with "whiplash" injuries. Whiplash occurs when the neck is forcefully whipped backward or forward, such as during a motor vehicle accident or during contact sports. The muscles, ligaments, tendons, discs, and nerves of the neck are susceptible to injury when this occurs.  RISK FACTORS  Risk of having a whiplash injury increases if:  · Osteoarthritis of the spine.  · Situations that make head or neck accidents or trauma more likely.  · High-risk sports (football, rugby, wrestling, hockey, auto racing, gymnastics, diving, contact karate, or boxing).  · Poor strength and flexibility of the neck.  · Previous neck injury.  · Poor tackling technique.  · Improperly fitted or padded equipment.  SYMPTOMS   · Pain or stiffness in the front or back of neck or both.  · Symptoms may present immediately or up to 24 hours after injury.  · Dizziness, headache, nausea, and vomiting.  · Muscle spasm with soreness and stiffness in the neck.  · Tenderness and swelling at the injury site.  PREVENTION  · Learn and use proper technique (avoid tackling with the head, spearing, and head-butting; use proper falling techniques to avoid landing on the head).  · Warm up and stretch properly before activity.  · Maintain physical fitness:    Strength, flexibility, and endurance.    Cardiovascular fitness.  · Wear properly fitted and padded protective equipment, such as padded soft collars, for participation in contact sports.  PROGNOSIS   Recovery from cervical strain and sprain injuries is dependent on the extent of the injury. These injuries are usually curable in 1 week to 3 months with appropriate treatment.   RELATED COMPLICATIONS   · Temporary numbness and weakness may occur if the nerve roots are damaged, and this may persist until the nerve has completely healed.  · Chronic pain due to frequent recurrence of symptoms.  · Prolonged healing,  especially if activity is resumed too soon (before complete recovery).  TREATMENT   Treatment initially involves the use of ice and medication to help reduce pain and inflammation. It is also important to perform strengthening and stretching exercises and modify activities that worsen symptoms so the injury does not get worse. These exercises may be performed at home or with a therapist. For patients who experience severe symptoms, a soft, padded collar may be recommended to be worn around the neck.   Improving your posture may help reduce symptoms. Posture improvement includes pulling your chin and abdomen in while sitting or standing. If you are sitting, sit in a firm chair with your buttocks against the back of the chair. While sleeping, try replacing your pillow with a small towel rolled to 2 inches in diameter, or use a cervical pillow or soft cervical collar. Poor sleeping positions delay healing.   For patients with nerve root damage, which causes numbness or weakness, the use of a cervical traction apparatus may be recommended. Surgery is rarely necessary for these injuries. However, cervical strain and sprains that are present at birth (congenital) may require surgery.  MEDICATION   · If pain medication is necessary, nonsteroidal anti-inflammatory medications, such as aspirin and ibuprofen, or other minor pain relievers, such as acetaminophen, are often recommended.  · Do not take pain medication for 7 days before surgery.  · Prescription pain relievers may be given if deemed necessary by your caregiver. Use only as directed and only as much as you need.    HEAT AND COLD:   · Cold treatment (icing) relieves pain and reduces inflammation. Cold treatment should be applied for 10 to 15 minutes every 2 to 3 hours for inflammation and pain and immediately after any activity that aggravates your symptoms. Use ice packs or an ice massage.  · Heat treatment may be used prior to performing the stretching and  strengthening activities prescribed by your caregiver, physical therapist, or athletic trainer. Use a heat pack or a warm soak.  SEEK MEDICAL CARE IF:   · Symptoms get worse or do not improve in 2 weeks despite treatment.  · New, unexplained symptoms develop (drugs used in treatment may produce side effects).  EXERCISES  RANGE OF MOTION (ROM) AND STRETCHING EXERCISES - Cervical Strain and Sprain  These exercises may help you when beginning to rehabilitate your injury. In order to successfully resolve your symptoms, you must improve your posture. These exercises are designed to help reduce the forward-head and rounded-shoulder posture which contributes to this condition. Your symptoms may resolve with or without further involvement from your physician, physical therapist or athletic trainer. While completing these exercises, remember:   · Restoring tissue flexibility helps normal motion to return to the joints. This allows healthier, less painful movement and activity.  · An effective stretch should be held for at least 20 seconds, although you may need to begin with shorter hold times for comfort.  · A stretch should never be painful. You should only feel a gentle lengthening or release in the stretched tissue.  STRETCH- Axial Extensors  · Lie on your back on the floor. You may bend your knees for comfort. Place a rolled-up hand towel or dish towel, about 2 inches in diameter, under the part of your head that makes contact with the floor.  · Gently tuck your chin, as if trying to make a "double chin," until you feel a gentle stretch at the base of your head.  · Hold __________ seconds.  Repeat __________ times. Complete this exercise __________ times per day.   STRETCH - Axial Extension   · Stand or sit on a firm surface. Assume a good posture: chest up, shoulders drawn back, abdominal muscles slightly tense, knees unlocked (if standing) and feet hip width apart.  · Slowly retract your chin so your head slides back  and your chin slightly lowers. Continue to look straight ahead.  · You should feel a gentle stretch in the back of your head. Be certain not to feel an aggressive stretch since this can cause headaches later.  · Hold for __________ seconds.  Repeat __________ times. Complete this exercise __________ times per day.  STRETCH - Cervical Side Bend   · Stand or sit on a firm surface. Assume a good posture: chest up, shoulders drawn back, abdominal muscles slightly tense, knees unlocked (if standing) and feet hip width apart.  · Without letting your nose or shoulders move, slowly tip your right / left ear to your shoulder until your feel a gentle stretch in the muscles on the opposite side of your neck.  · Hold __________ seconds.  Repeat __________ times. Complete this exercise __________ times per day.  STRETCH - Cervical Rotators   · Stand or sit on a firm surface. Assume a good posture: chest up, shoulders drawn back, abdominal muscles slightly tense, knees unlocked (if standing) and feet hip width apart.  · Keeping your eyes level with the ground, slowly turn your head until you feel a gentle stretch along   the back and opposite side of your neck.  · Hold __________ seconds.  Repeat __________ times. Complete this exercise __________ times per day.  RANGE OF MOTION - Neck Circles   · Stand or sit on a firm surface. Assume a good posture: chest up, shoulders drawn back, abdominal muscles slightly tense, knees unlocked (if standing) and feet hip width apart.  · Gently roll your head down and around from the back of one shoulder to the back of the other. The motion should never be forced or painful.  · Repeat the motion 10-20 times, or until you feel the neck muscles relax and loosen.  Repeat __________ times. Complete the exercise __________ times per day.  STRENGTHENING EXERCISES - Cervical Strain and Sprain  These exercises may help you when beginning to rehabilitate your injury. They may resolve your symptoms with or  without further involvement from your physician, physical therapist, or athletic trainer. While completing these exercises, remember:   · Muscles can gain both the endurance and the strength needed for everyday activities through controlled exercises.  · Complete these exercises as instructed by your physician, physical therapist, or athletic trainer. Progress the resistance and repetitions only as guided.  · You may experience muscle soreness or fatigue, but the pain or discomfort you are trying to eliminate should never worsen during these exercises. If this pain does worsen, stop and make certain you are following the directions exactly. If the pain is still present after adjustments, discontinue the exercise until you can discuss the trouble with your clinician.  STRENGTH - Cervical Flexors, Isometric  · Face a wall, standing about 6 inches away. Place a small pillow, a ball about 6-8 inches in diameter, or a folded towel between your forehead and the wall.  · Slightly tuck your chin and gently push your forehead into the soft object. Push only with mild to moderate intensity, building up tension gradually. Keep your jaw and forehead relaxed.  · Hold 10 to 20 seconds. Keep your breathing relaxed.  · Release the tension slowly. Relax your neck muscles completely before you start the next repetition.  Repeat __________ times. Complete this exercise __________ times per day.  STRENGTH- Cervical Lateral Flexors, Isometric   · Stand about 6 inches away from a wall. Place a small pillow, a ball about 6-8 inches in diameter, or a folded towel between the side of your head and the wall.  · Slightly tuck your chin and gently tilt your head into the soft object. Push only with mild to moderate intensity, building up tension gradually. Keep your jaw and forehead relaxed.  · Hold 10 to 20 seconds. Keep your breathing relaxed.  · Release the tension slowly. Relax your neck muscles completely before you start the next  repetition.  Repeat __________ times. Complete this exercise __________ times per day.  STRENGTH - Cervical Extensors, Isometric   · Stand about 6 inches away from a wall. Place a small pillow, a ball about 6-8 inches in diameter, or a folded towel between the back of your head and the wall.  · Slightly tuck your chin and gently tilt your head back into the soft object. Push only with mild to moderate intensity, building up tension gradually. Keep your jaw and forehead relaxed.  · Hold 10 to 20 seconds. Keep your breathing relaxed.  · Release the tension slowly. Relax your neck muscles completely before you start the next repetition.  Repeat __________ times. Complete this exercise __________ times per day.    POSTURE AND BODY MECHANICS CONSIDERATIONS - Cervical Strain and Sprain  Keeping correct posture when sitting, standing or completing your activities will reduce the stress put on different body tissues, allowing injured tissues a chance to heal and limiting painful experiences. The following are general guidelines for improved posture. Your physician or physical therapist will provide you with any instructions specific to your needs. While reading these guidelines, remember:  · The exercises prescribed by your provider will help you have the flexibility and strength to maintain correct postures.  · The correct posture provides the optimal environment for your joints to work. All of your joints have less wear and tear when properly supported by a spine with good posture. This means you will experience a healthier, less painful body.  · Correct posture must be practiced with all of your activities, especially prolonged sitting and standing. Correct posture is as important when doing repetitive low-stress activities (typing) as it is when doing a single heavy-load activity (lifting).  PROLONGED STANDING WHILE SLIGHTLY LEANING FORWARD  When completing a task that requires you to lean forward while standing in one  place for a long time, place either foot up on a stationary 2- to 4-inch high object to help maintain the best posture. When both feet are on the ground, the low back tends to lose its slight inward curve. If this curve flattens (or becomes too large), then the back and your other joints will experience too much stress, fatigue more quickly, and can cause pain.   RESTING POSITIONS  Consider which positions are most painful for you when choosing a resting position. If you have pain with flexion-based activities (sitting, bending, stooping, squatting), choose a position that allows you to rest in a less flexed posture. You would want to avoid curling into a fetal position on your side. If your pain worsens with extension-based activities (prolonged standing, working overhead), avoid resting in an extended position such as sleeping on your stomach. Most people will find more comfort when they rest with their spine in a more neutral position, neither too rounded nor too arched. Lying on a non-sagging bed on your side with a pillow between your knees, or on your back with a pillow under your knees will often provide some relief. Keep in mind, being in any one position for a prolonged period of time, no matter how correct your posture, can still lead to stiffness.  WALKING  Walk with an upright posture. Your ears, shoulders, and hips should all line up.  OFFICE WORK  When working at a desk, create an environment that supports good, upright posture. Without extra support, muscles fatigue and lead to excessive strain on joints and other tissues.  CHAIR:  · A chair should be able to slide under your desk when your back makes contact with the back of the chair. This allows you to work closely.  · The chair's height should allow your eyes to be level with the upper part of your monitor and your hands to be slightly lower than your elbows.  · Body position:    Your feet should make contact with the floor. If this is not  possible, use a foot rest.    Keep your ears over your shoulders. This will reduce stress on your neck and low back.     This information is not intended to replace advice given to you by your health care provider. Make sure you discuss any questions you have with your health care provider.       Document Released: 07/24/2005 Document Revised: 08/14/2014 Document Reviewed: 11/05/2008  [employer] Interactive Patient Education ©2016 [employer]
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
## 2016-07-24 ENCOUNTER — Other Ambulatory Visit: Payer: Self-pay | Admitting: Internal Medicine

## 2016-08-15 ENCOUNTER — Ambulatory Visit (INDEPENDENT_AMBULATORY_CARE_PROVIDER_SITE_OTHER): Payer: Medicaid Other

## 2016-08-15 ENCOUNTER — Ambulatory Visit (HOSPITAL_COMMUNITY)
Admission: EM | Admit: 2016-08-15 | Discharge: 2016-08-15 | Disposition: A | Discharge status: Home / Self Care | Payer: Medicaid Other | Attending: Family Medicine | Admitting: Family Medicine

## 2016-08-15 DIAGNOSIS — S6991XA Unspecified injury of right wrist, hand and finger(s), initial encounter: Secondary | ICD-10-CM
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
## 2016-08-15 NOTE — Discharge Instructions (Signed)
You will require consultation with a hand specialist for your injury. There were no acute findings on your Xrays today. Schedule an appointment with Dr. Betha LoaKevin Kuzma for evaluation of your hand. Should you have any pain, you may take Tylenol or ibuprofen over the counter as needed.

## 2016-09-20 ENCOUNTER — Ambulatory Visit: Payer: Medicaid Other | Attending: Orthopedic Surgery | Admitting: Occupational Therapy

## 2016-09-20 DIAGNOSIS — M25641 Stiffness of right hand, not elsewhere classified: Secondary | ICD-10-CM | POA: Diagnosis present

## 2016-09-20 DIAGNOSIS — R278 Other lack of coordination: Secondary | ICD-10-CM | POA: Diagnosis present

## 2016-09-20 DIAGNOSIS — M79641 Pain in right hand: Secondary | ICD-10-CM

## 2016-09-20 NOTE — Patient Instructions (Addendum)
Consider buying a microwave hotpack at [employer] You can wrap the hotpack in a towel and heat your right hand for 8-10 mins before stretching. With your left hand gently stretch your middle, ring and small fingers into extension 10 reps 4-6 x day  Make sure after you wash your hands that you dry your right hand well, and open hand to allow air to get to your palm to prevent skin breakdown.  Place foam roll with ace bandage wrapped around it,  in your right palm, when awake, keep it there for 15-30 mins 2-3 x day. Do not use while driving. Remove roll splint if you have increased pain, swelling, numbness or redness, or pressure areas that do not go away after 10-15 mins.  Wear oval 8 splint on the tip joint of your ring finger while awake as demonstrated, remove if pressure areas or pain. Remove oval splint when using roll splint.

## 2016-09-21 NOTE — Therapy (Signed)
[facility] [address] [place], [place], [address] Phone: [phone]   Fax:  [phone]  Occupational Therapy Evaluation  Patient Details  Name: [name] MRN: [id number] Date of Birth: [birth year] Referring Provider: Dr. Merlyn Lot  Encounter Date: 09/20/2016      OT End of Session - 09/21/16 1438    Visit Number 1   Number of Visits 1   Authorization Type MCD - not a qualifying diagnosis for treatment   Authorization - Visit Number 1   Authorization - Number of Visits 1   OT Start Time 1106   OT Stop Time 1200   OT Time Calculation (min) 54 min   Activity Tolerance Patient tolerated treatment well   Behavior During Therapy Anxious      Past Medical History:  Diagnosis Date  . Abnormal Pap smear   . Depression    no meds   . Hx MRSA infection    History only - 2 Negative MRSA swabs in 2012  . Iron deficiency anemia   . Post-partum depression    hx  . STD (female)    chlamydia and trich  . SVD (spontaneous vaginal delivery)    x 5    Past Surgical History:  Procedure Laterality Date  . CERVICAL BIOPSY  W/ LOOP ELECTRODE EXCISION    . finger nail removed    . I&D EXTREMITY Right 04/13/2015   Procedure: RIGHT LONG FINGER DEBRIDEMENT AND FLEXOR TENDON SHEATH DRAINAGE;  Surgeon: Mack Hook, MD;  Location: [facility];  Service: Orthopedics;  Laterality: Right;  . INCISION AND DRAINAGE Right 04/13/2015   long finger excisional debridement of skin and subcutaneous tissues, with irrigation/drainage of the flexor tendon sheath, and removal of nail plate  . LAPAROSCOPIC TUBAL LIGATION Bilateral 12/02/2013   Procedure: LAPAROSCOPIC TUBAL LIGATION WITH FILSCHE CLIPS;  Surgeon: Adam Phenix, MD;  Location: [facility];  Service: Gynecology;  Laterality: Bilateral;  FILSCHE CLIPS  . TUBAL LIGATION Bilateral   . WISDOM TOOTH EXTRACTION      There were no vitals filed for this visit.      Subjective Assessment -  09/21/16 1639    Patient Stated Goals to get hand moving   Currently in Pain? Yes   Pain Score 8    Pain Location Hand   Pain Orientation Right   Pain Descriptors / Indicators Aching   Pain Type Chronic pain   Pain Onset Other (comment)  2016   Aggravating Factors  movment   Pain Relieving Factors inactivity   Multiple Pain Sites No           [facility] OT Assessment - 09/21/16 0001      Assessment   Diagnosis contracture of middle, ring and small fingers   Referring Provider Dr. Merlyn Lot   Onset Date 09/12/16  referral date   Prior Therapy OT     Precautions   Precautions Other (comment)   Precaution Comments pain, hypersensitivity     Home  Environment   Family/patient expects to be discharged to: Private residence   Lives With Significant other  children     Prior Function   Level of Independence Independent   Vocation On disability     ADL   ADL comments modified indedendent with all basic ADLS using primarily LUE, pt reports she is unable to braid her dtr's hair     Written Expression   Dominant Hand Right   Handwriting --  not tested  Sensation   Light Touch Appears Intact     Coordination   Gross Motor Movements are Fluid and Coordinated No   Fine Motor Movements are Fluid and Coordinated No     ROM / Strength   AROM / PROM / Strength AROM     AROM   Overall AROM  Deficits   Overall AROM Comments only limited  active ROM noted afted heat applied  Ptdemonstrates hypersensitivity in right hand.     Right Hand AROM   R Ring DIP 0-70 --  hyperext     Left Hand PROM   L Long  MCP 0-90 90 Degrees   L Long PIP 0-100 65 Degrees   L Long DIP 0-70 -5 Degrees   L Ring  MCP 0-90 90 Degrees   L Ring PIP 0-100 70 Degrees   L Ring DIP 0-70 15 Degrees  hyperext.   L Little  MCP 0-90 75 Degrees   L Little PIP 0-100 -10 Degrees   L Little DIP 0-70 35 Degrees  flexion to 0 ext          Hot pack was applied to right hand x 10 mins, for stiffness  and pain. Gentle P/ROM extension performed to digits compositely. Pt was fitted with and oval 8 for DIP joint hyperextension at ring finger. Therapist  provided pt with a foam roll, wrapped in ace bandage  to use as a splint for gentle finger ext. Pt/ and significant other were educated in in application and precautions. Therapist discussed hand hygiene with pt as demonstrates maceration at base of MPS and in palm. Pt was encouraged to dry hand thoroughly after washing and to allow air to get to her palm. Pt was educated in passive composite ext of digits and then A/ROM finger flexion/ ext as able.               OT Education - 09/21/16 1422    Education provided Yes   Education Details roll splint for finger ext., oval 8 splint wear, care and preacutions for 4th digit, gentle passive ext of digits, safe use of hotpack prior to exercise   Person(s) Educated Patient   Methods Explanation;Demonstration;Handout;Verbal cues   Comprehension Verbalized understanding;Returned demonstration;Verbal cues required;Tactile cues required                    Plan - 09/21/16 1428    Clinical Impression Statement 31 y.o presents to occupational therapy with a right hand contracture. Pt closed her hand in a door on 04/02/15 and pt underwent I and D 04/13/15 by Dr. Janee Mornthompson. Pt was seen briefly by OT at that time. Pt reports she has not been moving her right hand and no ring middle and small fingers are contracted in flexion. Pt can benefit from occupational therapy for splinting,, education regarding ROM and prevention of skin breakdown.   Rehab Potential Fair   Clinical Impairments Affecting Rehab Potential decreased pain tolerance/sensitivty, decreased financial resources limiting therapy visits   OT Frequency One time visit  1 time visit due to MCD limitations.   OT Duration 4 weeks   OT Treatment/Interventions Self-care/ADL training;Therapeutic exercise;Patient/family  education;Splinting;Manual Therapy;Compression bandaging;DME and/or AE instruction;Therapeutic activities;Nature conservation officerlectrical Stimulation;Scar mobilization;Contrast Bath;Passive range of motion   Plan Pt was seen for evaluation and treatment on day of eval, and therefore no goals were set. Pt was fitted with a roll splint for right hand and educated regarding passive ext of digits, hand hygeine and oval 8 wear.  Consulted and Agree with Plan of Care Patient      Patient will benefit from skilled therapeutic intervention in order to improve the following deficits and impairments:  Pain, Impaired flexibility, Increased edema, Decreased range of motion  Visit Diagnosis: Pain in right hand - Plan: Ot plan of care cert/re-cert  Stiffness of right hand, not elsewhere classified - Plan: Ot plan of care cert/re-cert  Other lack of coordination - Plan: Ot plan of care cert/re-cert    Problem List Patient Active Problem List   Diagnosis Date Noted  . Acute cystitis without hematuria   . Tenosynovitis of finger 04/15/2015  . Osteomyelitis of finger (HCC)   . Iron deficiency anemia 04/13/2015  . Encounter for sterilization 12/02/2013  . Contraception management 10/30/2013  . Hepatitis B surface antigen positive 06/26/2012  . 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 Thrombocytopenia (HCC) 04/05/2011  . Abnormal Pap smear of cervix 04/05/2011  . Hx LEEP (loop electrosurgical excision procedure), cervix, pregnancy 04/05/2011    Kinda Pottle 09/21/2016, 4:55 PM Keene Breath, OTR/L [phone] Phone: [phone] 4:55 PM 09/21/16   [facility] [address] [place], [place], [address] Phone: [phone]   Fax:  [phone]  Name: Faye Strohman MRN: [id number] Date of Birth: [birth year]

## 2017-05-07 ENCOUNTER — Encounter: Payer: Self-pay | Admitting: Obstetrics & Gynecology

## 2018-02-15 ENCOUNTER — Emergency Department (HOSPITAL_COMMUNITY): Payer: Medicaid Other

## 2018-02-15 ENCOUNTER — Emergency Department (HOSPITAL_COMMUNITY)
Admission: EM | Admit: 2018-02-15 | Discharge: 2018-02-16 | Disposition: A | Discharge status: Home / Self Care | Payer: Medicaid Other | Attending: Emergency Medicine | Admitting: Emergency Medicine

## 2018-02-15 ENCOUNTER — Encounter (HOSPITAL_COMMUNITY): Payer: Self-pay | Admitting: Emergency Medicine

## 2018-02-15 DIAGNOSIS — Y929 Unspecified place or not applicable: Secondary | ICD-10-CM | POA: Diagnosis not present

## 2018-02-15 DIAGNOSIS — S39012A Strain of muscle, fascia and tendon of lower back, initial encounter: Secondary | ICD-10-CM | POA: Diagnosis not present

## 2018-02-15 DIAGNOSIS — Y999 Unspecified external cause status: Secondary | ICD-10-CM | POA: Insufficient documentation

## 2018-02-15 DIAGNOSIS — Z79899 Other long term (current) drug therapy: Secondary | ICD-10-CM | POA: Diagnosis not present

## 2018-02-15 DIAGNOSIS — Y9389 Activity, other specified: Secondary | ICD-10-CM | POA: Insufficient documentation

## 2018-02-15 DIAGNOSIS — R0789 Other chest pain: Secondary | ICD-10-CM | POA: Diagnosis not present

## 2018-02-15 DIAGNOSIS — S3992XA Unspecified injury of lower back, initial encounter: Secondary | ICD-10-CM | POA: Diagnosis present

## 2018-02-15 DIAGNOSIS — X500XXA Overexertion from strenuous movement or load, initial encounter: Secondary | ICD-10-CM | POA: Insufficient documentation

## 2018-02-15 LAB — BASIC METABOLIC PANEL
Anion gap: 8 (ref 5–15)
BUN: 9 mg/dL (ref 6–20)
CO2: 23 mmol/L (ref 22–32)
Calcium: 9 mg/dL (ref 8.9–10.3)
Chloride: 109 mmol/L (ref 98–111)
Creatinine, Ser: 0.94 mg/dL (ref 0.44–1.00)
GFR calc Af Amer: >60 mL/min (ref >60)
GFR calc non Af Amer: >60 mL/min (ref >60)
Glucose, Bld: 93 mg/dL (ref 70–99)
Potassium: 4.4 mmol/L (ref 3.5–5.1)
Sodium: 140 mmol/L (ref 135–145)

## 2018-02-15 LAB — CBC WITH DIFFERENTIAL/PLATELET
Abs Immature Granulocytes: 0 10*3/uL (ref 0.0–0.1)
Basophils Absolute: 0 10*3/uL (ref 0.0–0.1)
Basophils Relative: 0 %
Eosinophils Absolute: 0.1 10*3/uL (ref 0.0–0.7)
Eosinophils Relative: 3 %
HEMATOCRIT: 40.7 % (ref 36.0–46.0)
HEMOGLOBIN: 12.8 g/dL (ref 12.0–15.0)
IMMATURE GRANULOCYTES: 0 %
LYMPHS ABS: 2 10*3/uL (ref 0.7–4.0)
LYMPHS PCT: 45 %
MCH: 29.8 pg (ref 26.0–34.0)
MCHC: 31.4 g/dL (ref 30.0–36.0)
MCV: 94.7 fL (ref 78.0–100.0)
MONOS PCT: 9 %
Monocytes Absolute: 0.4 10*3/uL (ref 0.1–1.0)
NEUTROS PCT: 43 %
Neutro Abs: 1.9 10*3/uL (ref 1.7–7.7)
Platelets: 166 10*3/uL (ref 150–400)
RBC: 4.3 MIL/uL (ref 3.87–5.11)
RDW: 11.8 % (ref 11.5–15.5)
WBC: 4.5 10*3/uL (ref 4.0–10.5)

## 2018-02-15 LAB — TROPONIN I: Troponin I: <0.03 ng/mL (ref <0.03)

## 2018-02-15 MED ORDER — LIDOCAINE 5 % EX PTCH: 1.0000 | MEDICATED_PATCH | CUTANEOUS | 0 refills | Status: DC

## 2018-02-15 MED ORDER — DICLOFENAC EPOLAMINE 1.3 % TD PTCH
1.0000 | MEDICATED_PATCH | Freq: Once | TRANSDERMAL | Status: DC
  Administered 2018-02-15: 1 via TRANSDERMAL
  Filled 2018-02-15: qty 1

## 2018-02-15 MED ORDER — KETOROLAC TROMETHAMINE 30 MG/ML IJ SOLN
15.0000 mg | Freq: Once | INTRAMUSCULAR | Status: AC
  Administered 2018-02-15: 15 mg via INTRAVENOUS
  Filled 2018-02-15: qty 1

## 2018-02-15 NOTE — Discharge Instructions (Addendum)
As discussed, your evaluation today has been largely reassuring.  But, it is important that you monitor your condition carefully, and do not hesitate to return to the ED if you develop new, or concerning changes in your condition. ? ?Otherwise, please follow-up with your physician for appropriate ongoing care. ? ?

## 2018-02-15 NOTE — ED Triage Notes (Signed)
Pt reports lumbar back pain for two days, reports after lifting her grandchild. States she has tried tylenol, ibuprofen, muscle relaxers and had a family member step on her back to see if that would help. Does report loss of control of bladder. No tingling/numbness.

## 2018-02-15 NOTE — ED Provider Notes (Signed)
[facility] Note   CSN: [id number] Arrival date & time: 02/15/18  1922     History   Chief Complaint Chief Complaint  Patient presents with  . Back Pain    HPI [name] is a 32 y.o. female.  HPI  Patient presents with concern of left lower back pain, and during the evaluation states that she has chest pain as well. Onset was 2 days ago, after lifting a child. Since that time she has had soreness in her left lower back, nonradiating, worse with activity. No abdominal pain, no urinary complaints, no nausea, vomiting, diarrhea. Onset of chest pain seems about the same, with description of anterior upper thoracic discomfort, tight, without dyspnea, without lightheadedness, without syncope. Patient has taken Tylenol, ibuprofen, without appreciable relief.  Past Medical History:  Diagnosis Date  . Abnormal Pap smear   . Depression    no meds   . Hx MRSA infection    History only - 2 Negative MRSA swabs in 2012  . Iron deficiency anemia   . Post-partum depression    hx  . STD (female)    chlamydia and trich  . SVD (spontaneous vaginal delivery)    x 5    Patient Active Problem List   Diagnosis Date Noted  . Acute cystitis without hematuria   . Tenosynovitis of finger 04/15/2015  . Osteomyelitis of finger (HCC)   . Iron deficiency anemia 04/13/2015  . Encounter for sterilization 12/02/2013  . Contraception management 10/30/2013  . Hepatitis B surface antigen positive 06/26/2012  . 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 Thrombocytopenia (HCC) 04/05/2011  . Abnormal Pap smear of cervix 04/05/2011  . Hx LEEP (loop electrosurgical excision procedure), cervix, pregnancy 04/05/2011    Past Surgical History:  Procedure Laterality Date  . CERVICAL BIOPSY  W/ LOOP ELECTRODE EXCISION    . finger nail removed    . I&D EXTREMITY Right 04/13/2015   Procedure: RIGHT  LONG FINGER DEBRIDEMENT AND FLEXOR TENDON SHEATH DRAINAGE;  Surgeon: Mack Hookavid Thompson, MD;  Location: [facility] OR;  Service: Orthopedics;  Laterality: Right;  . INCISION AND DRAINAGE Right 04/13/2015   long finger excisional debridement of skin and subcutaneous tissues, with irrigation/drainage of the flexor tendon sheath, and removal of nail plate  . LAPAROSCOPIC TUBAL LIGATION Bilateral 12/02/2013   Procedure: LAPAROSCOPIC TUBAL LIGATION WITH FILSCHE CLIPS;  Surgeon: Adam PhenixJames G Arnold, MD;  Location: [facility] ORS;  Service: Gynecology;  Laterality: Bilateral;  FILSCHE CLIPS  . TUBAL LIGATION Bilateral   . WISDOM TOOTH EXTRACTION       OB History    Gravida  4   Para  4   Term  4   Preterm  0   AB  0   Living  5     SAB  0   TAB  0   Ectopic  0   Multiple  1   Live Births  5            Home Medications    Prior to Admission medications   Medication Sig Start Date End Date Taking? Authorizing Provider  buPROPion (WELLBUTRIN SR) 150 MG 12 hr tablet Take 150 mg by mouth 2 (two) times daily. 09/21/15   [provider]  cefTRIAXone 2 g in dextrose 5 % 50 mL Inject 2 g into the vein daily. 04/16/15   Valentino NoseBoswell, Nathan, MD  ferrous sulfate 325 (65 FE) MG tablet Take 1 tablet (325 mg  total) by mouth daily with breakfast. 04/16/15   Valentino Nose, MD  ibuprofen (ADVIL,MOTRIN) 600 MG tablet Take 1 tablet (600 mg total) by mouth every 6 (six) hours as needed. 11/19/15   Lyndal Pulley, MD  LATUDA 20 MG TABS tablet Take 20 mg by mouth daily. 09/21/15   [provider]  lidocaine (LIDODERM) 5 % Place 1 patch onto the skin daily. Remove & Discard patch within 12 hours or as directed by MD 02/15/18   Gerhard Munch, MD  meloxicam (MOBIC) 15 MG tablet Take 1 tablet (15 mg total) by mouth daily. 04/16/15   Mack Hook, MD  meloxicam (MOBIC) 15 MG tablet Take 1 tablet (15 mg total) by mouth daily. 04/16/15   Valentino Nose, MD  ondansetron (ZOFRAN) 4 MG tablet Take 1 tablet (4 mg total) by  mouth every 6 (six) hours as needed for nausea. 04/16/15   Valentino Nose, MD    Family History Family History  Problem Relation Age of Onset  . Cancer Paternal Grandmother   . Hypertension Maternal Grandmother   . Heart disease Maternal Grandmother   . Diabetes Maternal Grandmother   . Stroke Maternal Grandmother   . Cancer Maternal Grandfather   . Hypertension Mother     Social History Social History   Tobacco Use  . Smoking status: Never Smoker  . Smokeless tobacco: Never Used  Substance Use Topics  . Alcohol use: Yes    Comment: 04/13/2015 "stopped drinking in 03/2014"  . Drug use: No     Allergies   Patient has no known allergies.   Review of Systems Review of Systems  Constitutional:       Per HPI, otherwise negative  HENT:       Per HPI, otherwise negative  Respiratory:       Per HPI, otherwise negative  Cardiovascular:       Per HPI, otherwise negative  Gastrointestinal: Negative for vomiting.  Endocrine:       Negative aside from HPI  Genitourinary:       Neg aside from HPI   Musculoskeletal:       Per HPI, otherwise negative  Skin: Negative.   Neurological: Negative for syncope.     Physical Exam Updated Vital Signs BP (!) 139/114   Pulse 91   Temp 98.2 F (36.8 C) (Oral)   Resp 18   Ht 6\' 1"  (1.854 m)   Wt 74.8 kg (165 lb)   LMP 02/01/2018 (Within Days)   SpO2 100%   BMI 21.77 kg/m   Physical Exam  Constitutional: She is oriented to person, place, and time. She appears well-developed and well-nourished. No distress.  HENT:  Head: Normocephalic and atraumatic.  Eyes: Conjunctivae and EOM are normal.  Cardiovascular: Normal rate and regular rhythm.  Pulmonary/Chest: Effort normal and breath sounds normal. No stridor. No respiratory distress.  Abdominal: She exhibits no distension.  Musculoskeletal: She exhibits no edema or deformity.       Arms: Neurological: She is alert and oriented to person, place, and time. No cranial nerve  deficit.  Skin: Skin is warm and dry.  Psychiatric: She has a normal mood and affect.  Nursing note and vitals reviewed.    ED Treatments / Results  Labs (all labs ordered are listed, but only abnormal results are displayed) Labs Reviewed  BASIC METABOLIC PANEL  CBC WITH DIFFERENTIAL/PLATELET  TROPONIN I    EKG EKG Interpretation  Date/Time:  Friday February 15 2018 20:14:45 EDT Ventricular Rate:  50  PR Interval:    QRS Duration: 90 QT Interval:  351 QTC Calculation: 420 R Axis:   65 Text Interpretation:  Sinus rhythm ST elev, probable normal early repol pattern Artifact Abnormal ekg Confirmed by Gerhard Munch [phone]) on 02/15/2018 10:23:04 PM   Radiology Dg Chest 2 View  Result Date: 02/15/2018 CLINICAL DATA:  32 year old female with chest pain. EXAM: CHEST - 2 VIEW COMPARISON:  Chest radiograph dated 03/19/2007 FINDINGS: The heart size and mediastinal contours are within normal limits. Both lungs are clear. The visualized skeletal structures are unremarkable. IMPRESSION: No active cardiopulmonary disease. Electronically Signed   By: Elgie Collard M.D.   On: 02/15/2018 21:44    Procedures Procedures (including critical care time)  Medications Ordered in ED Medications  diclofenac (FLECTOR) 1.3 % 1 patch (1 patch Transdermal Patch Applied 02/15/18 2201)  ketorolac (TORADOL) 30 MG/ML injection 15 mg (15 mg Intravenous Given 02/15/18 2147)     Initial Impression / Assessment and Plan / ED Course  I have reviewed the triage vital signs and the nursing notes.  Pertinent labs & imaging results that were available during my care of the patient were reviewed by me and considered in my medical decision making (see chart for details).  Repeat exam patient is awake alert in no distress, states that she feels better. We discussed findings reassuring chest x-ray, initial labs reassuring. With her improvement here, no remaining labs are available, reassuring, and given her low  risk profile, no evidence for ongoing coronary ischemia, no evidence for other acute chest phenomena. Patient is low back pain likely secondary to strain, and given her improvement here with anti-inflammatories she was discharged with similar medication to follow-up with primary care.  Final Clinical Impressions(s) / ED Diagnoses   Final diagnoses:  Atypical chest pain  Strain of lumbar region, initial encounter    ED Discharge Orders        Ordered    lidocaine (LIDODERM) 5 %  Every 24 hours     02/15/18 2342       Gerhard Munch, MD 02/15/18 2355

## 2018-08-06 ENCOUNTER — Emergency Department (HOSPITAL_COMMUNITY)
Admission: EM | Admit: 2018-08-06 | Discharge: 2018-08-06 | Disposition: A | Discharge status: Home / Self Care | Payer: Medicaid Other | Attending: Emergency Medicine | Admitting: Emergency Medicine

## 2018-08-06 ENCOUNTER — Encounter (HOSPITAL_COMMUNITY): Payer: Self-pay | Admitting: *Deleted

## 2018-08-06 DIAGNOSIS — T7840XA Allergy, unspecified, initial encounter: Secondary | ICD-10-CM | POA: Diagnosis not present

## 2018-08-06 DIAGNOSIS — L509 Urticaria, unspecified: Secondary | ICD-10-CM | POA: Diagnosis present

## 2018-08-06 DIAGNOSIS — Z79899 Other long term (current) drug therapy: Secondary | ICD-10-CM | POA: Insufficient documentation

## 2018-08-06 MED ORDER — PREDNISONE 20 MG PO TABS
60.0000 mg | ORAL_TABLET | Freq: Once | ORAL | Status: AC
  Administered 2018-08-06: 60 mg via ORAL
  Filled 2018-08-06: qty 3

## 2018-08-06 MED ORDER — FAMOTIDINE 20 MG PO TABS
20.0000 mg | ORAL_TABLET | Freq: Once | ORAL | Status: AC
  Administered 2018-08-06: 20 mg via ORAL
  Filled 2018-08-06: qty 1

## 2018-08-06 MED ORDER — PREDNISONE 10 MG PO TABS: 20.0000 mg | ORAL_TABLET | Freq: Every day | ORAL | 0 refills | Status: AC

## 2018-08-06 MED ORDER — FAMOTIDINE 20 MG PO TABS: 20.0000 mg | ORAL_TABLET | Freq: Two times a day (BID) | ORAL | 0 refills | Status: DC

## 2018-08-06 MED ORDER — DIPHENHYDRAMINE HCL 25 MG PO TABS: 25.0000 mg | ORAL_TABLET | Freq: Four times a day (QID) | ORAL | 0 refills | Status: DC | PRN

## 2018-08-06 MED ORDER — DIPHENHYDRAMINE HCL 25 MG PO CAPS
25.0000 mg | ORAL_CAPSULE | Freq: Once | ORAL | Status: AC
  Administered 2018-08-06: 25 mg via ORAL
  Filled 2018-08-06: qty 1

## 2018-08-06 NOTE — ED Provider Notes (Signed)
Patient seen/examined in the Emergency Department in conjunction with Midlevel Provider  Patient reports allergic reaction with itching and hives.  She also reports mild swelling to lip. Exam : Awake alert, no acute distress.  Minimal swelling noted to lower lip.  No wheezing noted.   Plan: treat with prednisone/benadryl Discussed return precautions with patient.    [name], Brittany Leamer, MD 08/06/18 [phone]

## 2018-08-06 NOTE — ED Triage Notes (Signed)
To ED for eval of allergic rx to unknown substance. States she cleaned her house yesterday with a new cleaning agent. No new meds or food. Now with lower lip swelling, lower back rash, and upper leg rash. No sob. No tongue swelling. States she is itching and unable to sleep.

## 2018-08-06 NOTE — ED Notes (Signed)
Pt reports cleaning her entire house and this morning she woke up itching with hives to her neck and groin. Pt denies SOB or the feeling of impending doom. Pt is not sure what caused these symptoms.

## 2018-08-06 NOTE — ED Provider Notes (Signed)
[facility] Provider [facility]   CSN: [id number] Arrival date & time: 08/06/18  [id number]     History   Chief Complaint Chief Complaint  Patient presents with  . Allergic Reaction    HPI Hortencia Pilaricole Placeres is a 32 y.o. female.  HPI  Patient is a 32 year old female with a history of deficiency anemia presenting for allergic reaction.  Patient reports that last night around 11 PM, she experienced generalized pruritus, and noticed urticaria in bilateral groin regions, left side of her neck, and lower back.  She also reports noting that her lower lip seem to "swell".  She denies any lingual swelling, throat swelling, change in voice quality, wheezing, shortness of breath, palpitations, abdominal cramping, or diarrhea.  She denies any known allergens.  She denies any new medications, new soaps, lotions, detergents.  She did note that she cleaned her house yesterday, but did not use any new cleaning supplies.  LMP currently.  Patient denies chance of pregnancy due to tubal ligation.  Past Medical History:  Diagnosis Date  . Abnormal Pap smear   . Depression    no meds   . Hx MRSA infection    History only - 2 Negative MRSA swabs in 2012  . Iron deficiency anemia   . Post-partum depression    hx  . STD (female)    chlamydia and trich  . SVD (spontaneous vaginal delivery)    x 5    Patient Active Problem List   Diagnosis Date Noted  . Acute cystitis without hematuria   . Tenosynovitis of finger 04/15/2015  . Osteomyelitis of finger (HCC)   . Iron deficiency anemia 04/13/2015  . Encounter for sterilization 12/02/2013  . Contraception management 10/30/2013  . Hepatitis B surface antigen positive 06/26/2012  . 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 Thrombocytopenia (HCC) 04/05/2011  . Abnormal Pap smear of cervix 04/05/2011  . Hx LEEP (loop electrosurgical excision procedure), cervix, pregnancy  04/05/2011    Past Surgical History:  Procedure Laterality Date  . CERVICAL BIOPSY  W/ LOOP ELECTRODE EXCISION    . finger nail removed    . I&D EXTREMITY Right 04/13/2015   Procedure: RIGHT LONG FINGER DEBRIDEMENT AND FLEXOR TENDON SHEATH DRAINAGE;  Surgeon: Mack Hookavid Thompson, MD;  Location: [facility] OR;  Service: Orthopedics;  Laterality: Right;  . INCISION AND DRAINAGE Right 04/13/2015   long finger excisional debridement of skin and subcutaneous tissues, with irrigation/drainage of the flexor tendon sheath, and removal of nail plate  . LAPAROSCOPIC TUBAL LIGATION Bilateral 12/02/2013   Procedure: LAPAROSCOPIC TUBAL LIGATION WITH FILSCHE CLIPS;  Surgeon: Adam PhenixJames G Arnold, MD;  Location: [facility] OR[facility];  Service: Gynecology;  Laterality: Bilateral;  FILSCHE CLIPS  . TUBAL LIGATION Bilateral   . WISDOM TOOTH EXTRACTION       OB History    Gravida  4   Para  4   Term  4   Preterm  0   AB  0   Living  5     SAB  0   TAB  0   Ectopic  0   Multiple  1   Live Births  5            Home Medications    Prior to Admission medications   Medication Sig Start Date End Date Taking? Authorizing Provider  buPROPion (WELLBUTRIN SR) 150 MG 12 hr tablet Take 150 mg by mouth 2 (two) times daily. 09/21/15   [provider]  cefTRIAXone 2 g in dextrose 5 % 50 mL Inject 2 g into the vein daily. 04/16/15   Valentino Nose, MD  ferrous sulfate 325 (65 FE) MG tablet Take 1 tablet (325 mg total) by mouth daily with breakfast. 04/16/15   Valentino Nose, MD  ibuprofen (ADVIL,MOTRIN) 600 MG tablet Take 1 tablet (600 mg total) by mouth every 6 (six) hours as needed. 11/19/15   Lyndal Pulley, MD  LATUDA 20 MG TABS tablet Take 20 mg by mouth daily. 09/21/15   [provider]  lidocaine (LIDODERM) 5 % Place 1 patch onto the skin daily. Remove & Discard patch within 12 hours or as directed by MD 02/15/18   Gerhard Munch, MD  meloxicam (MOBIC) 15 MG tablet Take 1 tablet (15 mg total) by mouth daily.  04/16/15   Mack Hook, MD  meloxicam (MOBIC) 15 MG tablet Take 1 tablet (15 mg total) by mouth daily. 04/16/15   Valentino Nose, MD  ondansetron (ZOFRAN) 4 MG tablet Take 1 tablet (4 mg total) by mouth every 6 (six) hours as needed for nausea. 04/16/15   Valentino Nose, MD    Family History Family History  Problem Relation Age of Onset  . Cancer Paternal Grandmother   . Hypertension Maternal Grandmother   . Heart disease Maternal Grandmother   . Diabetes Maternal Grandmother   . Stroke Maternal Grandmother   . Cancer Maternal Grandfather   . Hypertension Mother     Social History Social History   Tobacco Use  . Smoking status: Never Smoker  . Smokeless tobacco: Never Used  Substance Use Topics  . Alcohol use: Yes    Comment: 04/13/2015 "stopped drinking in 03/2014"  . Drug use: No     Allergies   Patient has no known allergies.   Review of Systems Review of Systems  Constitutional: Negative for chills and fever.  HENT: Negative for trouble swallowing and voice change.   Respiratory: Negative for shortness of breath, wheezing and stridor.   Cardiovascular: Negative for chest pain.  Gastrointestinal: Negative for abdominal pain, diarrhea, nausea and vomiting.  Skin: Positive for rash.       +Pruritus     Physical Exam Updated Vital Signs BP 129/88 (BP Location: Right Arm)   Pulse 96   Temp 98.1 F (36.7 C) (Oral)   Resp 14   Ht 6\' 1"  (1.854 m)   Wt 74.8 kg   SpO2 100%   BMI 21.77 kg/m   Physical Exam Vitals signs and nursing note reviewed.  Constitutional:      General: She is not in acute distress.    Appearance: She is well-developed. She is not diaphoretic.     Comments: Sitting comfortably in bed.  HENT:     Head: Normocephalic and atraumatic.     Mouth/Throat:     Comments: Minimally appreciable lower lip swelling.  No lingual swelling, or edema posterior pharynx or uvula. Eyes:     General:        Right eye: No discharge.        Left eye: No  discharge.     Conjunctiva/sclera: Conjunctivae normal.     Comments: EOMs normal to gross examination.  Neck:     Musculoskeletal: Normal range of motion.  Cardiovascular:     Rate and Rhythm: Normal rate and regular rhythm.     Comments: Intact, 2+ radial pulse. Pulmonary:     Effort: Pulmonary effort is normal.     Breath sounds: Normal breath  sounds. No wheezing, rhonchi or rales.  Abdominal:     General: There is no distension.  Musculoskeletal: Normal range of motion.  Skin:    General: Skin is warm and dry.     Findings: Rash present.     Comments: Fine urticaria noted on the left side of neck.  No other urticaria noted of thorax, trunk, abdomen, or extremities.  Neurological:     Mental Status: She is alert.     Comments: Cranial nerves intact to gross observation. Patient moves extremities without difficulty.  Psychiatric:        Behavior: Behavior normal.        Thought Content: Thought content normal.        Judgment: Judgment normal.      ED Treatments / Results  Labs (all labs ordered are listed, but only abnormal results are displayed) Labs Reviewed - No data to display  EKG None  Radiology No results found.  Procedures Procedures (including critical care time)  Medications Ordered in ED Medications  predniSONE (DELTASONE) tablet 60 mg (has no administration in time range)  famotidine (PEPCID) tablet 20 mg (has no administration in time range)  diphenhydrAMINE (BENADRYL) capsule 25 mg (has no administration in time range)     Initial Impression / Assessment and Plan / ED Course  I have reviewed the triage vital signs and the nursing notes.  Pertinent labs & imaging results that were available during my care of the patient were reviewed by me and considered in my medical decision making (see chart for details).     Patient is nontoxic-appearing, hemodynamically stable, with clear lungs and in no acute distress.  Patient with apparent mild  allergic reaction.  Patient's presentation not consistent with angioedema.  There is no oral involvement.  At this time, appears only cutaneous involvement and not meeting criteria for anaphylaxis.  Case discussed with Dr. Zadie Rhineonald Wickline, who independently evaluated patient and agrees.  Patient did not given epinephrine.  Patient to be treated with prednisone, Pepcid, and Benadryl.  Patient was given strict return precautions to return for any angioedema, oral or lingual swelling, shortness of breath, change in phonation, and to call 911 for any of the symptoms.  Patient is in understanding and agrees with plan of care.  This is a shared visit with Dr. Zadie Rhineonald Wickline. Patient was independently evaluated by this attending physician. Attending physician consulted in evaluation and discharge management.  Final Clinical Impressions(s) / ED Diagnoses   Final diagnoses:  Allergic reaction, initial encounter    ED Discharge Orders         Ordered    predniSONE (DELTASONE) 10 MG tablet  Daily with breakfast     08/06/18 0714    famotidine (PEPCID) 20 MG tablet  2 times daily     08/06/18 0714    diphenhydrAMINE (BENADRYL) 25 MG tablet  Every 6 hours PRN     08/06/18 0714           Elisha PonderMurray, Rontavious Albright B, PA-C 08/06/18 0740    Zadie RhineWickline, Donald, MD 08/08/18 [phone]

## 2018-08-06 NOTE — Discharge Instructions (Addendum)
Please read and follow all provided instructions.  Your diagnoses today include:  1. Allergic reaction, initial encounter     Tests performed today include: Vital signs. See below for your results today.   Medications prescribed:   Take any prescribed medications only as directed.  You may take Benadryl, 25 mg every 6 hours for the next 2 to 3 days.  Benadryl can make you sleepy, so please use caution with driving, drinking alcohol or operating machinery while taking this medication.  You may take Pepcid, 20 mg twice daily for the next 2 to 3 days.  Please complete the course of prednisone, 40 mg for an additional 3 days.  Home care instructions:  Follow any educational materials contained in this packet  Follow-up instructions: Please follow-up with your primary care provider in the next 3 days for further evaluation of your symptoms.   Return instructions:  Please return to the Emergency Department if you experience worsening symptoms.  Call 9-1-1 immediately if you have an allergic reaction that involves your lips, mouth, throat or if you have any difficulty breathing. This is a life-threatening emergency.  Please return if you have any other emergent concerns.  Additional Information:  Your vital signs today were: BP 129/88 (BP Location: Right Arm)    Pulse 96    Temp 98.1 F (36.7 C) (Oral)    Resp 14    Ht 6\' 1"  (1.854 m)    Wt 74.8 kg    SpO2 100%    BMI 21.77 kg/m  If your blood pressure (BP) was elevated above 130/80 this visit, please have this repeated by your doctor within one month. --------------  Thank you for allowing us to participate in your care today!

## 2018-09-23 ENCOUNTER — Ambulatory Visit: Payer: Medicaid Other | Admitting: Student in an Organized Health Care Education/Training Program

## 2019-04-11 NOTE — Progress Notes (Deleted)
Subjective:    Patient ID: [name], female    DOB: 05/01/1986, 33 y.o.   MRN: [id number]   CC: New Patient  HPI: PMHx: Past Medical History:  Diagnosis Date  . Abnormal Pap smear   . Depression    no meds   . Hx MRSA infection    History only - 2 Negative MRSA swabs in 2012  . Iron deficiency anemia   . Post-partum depression    hx  . STD (female)    chlamydia and trich  . SVD (spontaneous vaginal delivery)    x 5     Surgical Hx: Past Surgical History:  Procedure Laterality Date  . CERVICAL BIOPSY  W/ LOOP ELECTRODE EXCISION    . finger nail removed    . I&D EXTREMITY Right 04/13/2015   Procedure: RIGHT LONG FINGER DEBRIDEMENT AND FLEXOR TENDON SHEATH DRAINAGE;  Surgeon: Mack Hookavid Thompson, MD;  Location: [facility];  Service: Orthopedics;  Laterality: Right;  . INCISION AND DRAINAGE Right 04/13/2015   long finger excisional debridement of skin and subcutaneous tissues, with irrigation/drainage of the flexor tendon sheath, and removal of nail plate  . LAPAROSCOPIC TUBAL LIGATION Bilateral 12/02/2013   Procedure: LAPAROSCOPIC TUBAL LIGATION WITH FILSCHE CLIPS;  Surgeon: Adam PhenixJames G Arnold, MD;  Location: [facility];  Service: Gynecology;  Laterality: Bilateral;  FILSCHE CLIPS  . TUBAL LIGATION Bilateral   . WISDOM TOOTH EXTRACTION       Family Hx: Family History  Problem Relation Age of Onset  . Cancer Paternal Grandmother   . Hypertension Maternal Grandmother   . Heart disease Maternal Grandmother   . Diabetes Maternal Grandmother   . Stroke Maternal Grandmother   . Cancer Maternal Grandfather   . Hypertension Mother      Social Hx: Current Social History   (Please include date ( .td) when updating information )  Who lives at home: *** 04/11/2019  Who would speak for you about health care matters: *** 04/11/2019  Transportation: *** 04/11/2019 Important Relationships & Pets: *** 04/11/2019  Current Stressors: *** 04/11/2019 Work / Education:  *** 04/11/2019 Religious / Personal  Beliefs: *** 04/11/2019 Interests / Fun: *** 04/11/2019 Other: *** 04/11/2019   Medications:   ROS: Woman:  Patient reports no  vision/ hearing changes,anorexia, weight change, fever ,adenopathy, persistant / recurrent hoarseness, swallowing issues, chest pain, edema,persistant / recurrent cough, hemoptysis, dyspnea(rest, exertional, paroxysmal nocturnal), gastrointestinal  bleeding (melena, rectal bleeding), abdominal pain, excessive heart burn, GU symptoms(dysuria, hematuria, pyuria, voiding/incontinence  Issues) syncope, focal weakness, severe memory loss, concerning skin lesions, depression, anxiety, abnormal bruising/bleeding, major joint swelling, breast masses or abnormal vaginal bleeding.    Man:  Patient reports no  vision/ hearing changes,anorexia, weight change, fever ,adenopathy, persistant / recurrent hoarseness, swallowing issues, chest pain, edema,persistant / recurrent cough, hemoptysis, dyspnea(rest, exertional, paroxysmal nocturnal), gastrointestinal  bleeding (melena, rectal bleeding), abdominal pain, excessive heart burn, GU symptoms(dysuria, hematuria, pyuria, voiding/incontinence  Issues) syncope, focal weakness, severe memory loss, concerning skin lesions, depression, anxiety, abnormal bruising/bleeding, major joint swelling.    Preventative Screening Colonoscopy: year*** results *** Mammogram: year*** results *** Pap test: year*** results *** PSA: year*** results *** DEXA: year*** results *** Tetanus vaccine: year*** results *** Pneumonia vaccine: year*** results *** Shingles vaccine: year*** results *** Heart stress test: year*** results *** Echocardiogram: year*** results *** Xrays: year*** results *** CT/MRI: year*** results ***  Smoking status reviewed  Review of Systems   Objective:  There were no vitals taken for this visit. Vitals and nursing  note reviewed  General: well nourished, in no acute distress HEENT: normocephalic, TM's visualized bilaterally,  no scleral icterus or conjunctival pallor, no nasal discharge, moist mucous membranes, good dentition without erythema or discharge noted in posterior oropharynx Neck: supple, non-tender, without lymphadenopathy Cardiac: RRR, clear S1 and S2, no murmurs, rubs, or gallops Respiratory: clear to auscultation bilaterally, no increased work of breathing Abdomen: soft, nontender, nondistended, no masses or organomegaly. Bowel sounds present Extremities: no edema or cyanosis. Warm, well perfused. 2+ radial and PT pulses bilaterally Skin: warm and dry, no rashes noted Neuro: alert and oriented, no focal deficits   Assessment & Plan:    No problem-specific Assessment & Plan notes found for this encounter.    No follow-ups on file.   Caroline More, DO, PGY-3

## 2019-04-16 ENCOUNTER — Ambulatory Visit: Payer: Medicaid Other | Admitting: Family Medicine

## 2020-03-08 ENCOUNTER — Ambulatory Visit: Payer: Medicaid Other | Admitting: Medical

## 2020-04-07 ENCOUNTER — Ambulatory Visit: Payer: Medicaid Other | Admitting: Obstetrics

## 2020-12-10 ENCOUNTER — Ambulatory Visit: Payer: Medicaid Other | Admitting: Medical

## 2021-04-20 ENCOUNTER — Ambulatory Visit (INDEPENDENT_AMBULATORY_CARE_PROVIDER_SITE_OTHER): Payer: Medicaid Other | Admitting: Primary Care

## 2021-10-11 ENCOUNTER — Other Ambulatory Visit: Payer: Self-pay | Admitting: Advanced Practice Midwife

## 2021-10-11 ENCOUNTER — Encounter: Payer: Self-pay | Admitting: Advanced Practice Midwife

## 2021-10-11 ENCOUNTER — Other Ambulatory Visit (HOSPITAL_COMMUNITY)
Admission: RE | Admit: 2021-10-11 | Discharge: 2021-10-11 | Disposition: A | Discharge status: Home / Self Care | Payer: Medicaid Other | Source: Ambulatory Visit | Attending: Advanced Practice Midwife | Admitting: Advanced Practice Midwife

## 2021-10-11 ENCOUNTER — Other Ambulatory Visit: Payer: Self-pay

## 2021-10-11 ENCOUNTER — Ambulatory Visit (INDEPENDENT_AMBULATORY_CARE_PROVIDER_SITE_OTHER): Payer: Medicaid Other | Admitting: Advanced Practice Midwife

## 2021-10-11 VITALS — BP 117/75 | HR 94 | Wt 172.0 lb

## 2021-10-11 DIAGNOSIS — N920 Excessive and frequent menstruation with regular cycle: Secondary | ICD-10-CM

## 2021-10-11 DIAGNOSIS — Z01419 Encounter for gynecological examination (general) (routine) without abnormal findings: Secondary | ICD-10-CM

## 2021-10-11 DIAGNOSIS — Z9889 Other specified postprocedural states: Secondary | ICD-10-CM | POA: Diagnosis not present

## 2021-10-11 DIAGNOSIS — N644 Mastodynia: Secondary | ICD-10-CM | POA: Diagnosis not present

## 2021-10-11 DIAGNOSIS — Z87410 Personal history of cervical dysplasia: Secondary | ICD-10-CM

## 2021-10-11 DIAGNOSIS — Z9851 Tubal ligation status: Secondary | ICD-10-CM

## 2021-10-11 DIAGNOSIS — Z0000 Encounter for general adult medical examination without abnormal findings: Secondary | ICD-10-CM

## 2021-10-11 NOTE — Patient Instructions (Signed)
Breast ultrasound and diagnostic mammogram scheduled for 10/24/21 at 10:30 AM at the breast center.  ?

## 2021-10-11 NOTE — Progress Notes (Signed)
Patient breast ultrasound and diagnostic mammogram scheduled for 10/24/21 at 10:30 AM.  ? ? ?

## 2021-10-11 NOTE — Progress Notes (Signed)
? ? ?GYNECOLOGY ANNUAL PREVENTATIVE CARE ENCOUNTER NOTE ? ?History:    ? [name] is a 36 y.o. [id number] female here for a routine annual gynecologic exam.  Current complaints: recurrent heavy periods, onset with BTL in 2015.  Patient also reports 3 month history of bilateral breast pain which waxes and wanes.  Denies abnormal vaginal bleeding, discharge, pelvic pain, problems with intercourse or other gynecologic concerns.  ?  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  Wedding date planned for spring summer 2024. ? ?Gynecologic History ?No LMP recorded. ?Contraception: tubal ligation ?Last Pap: 10/2013. Result was normal with negative HPV ? ? ?Obstetric History ?OB History  ?Gravida Para Term Preterm AB Living  ?4 4 4  0 0 5  ?SAB IAB Ectopic Multiple Live Births  ?0 0 0 1 5  ?  ?# Outcome Date GA Lbr Len/2nd Weight Sex Delivery Anes PTL Lv  ?4A Term 06/02/11 [redacted]w[redacted]d 41:00 / 00:04 5 lb 11.9 oz (2.605 kg) M Vag-Spont EPI  LIV  ?4B Term 06/02/11 [redacted]w[redacted]d 41:00 / 00:29 5 lb 9.4 oz (2.534 kg) F Vag-Spont EPI  LIV  ?3 Term 10/29/06 [redacted]w[redacted]d  6 lb 1 oz (2.75 kg) F Vag-Spont None  LIV  ?2 Term 01/17/04 [redacted]w[redacted]d  5 lb 8 oz (2.495 kg) F Vag-Spont None  LIV  ?1 Term 06/23/00 [redacted]w[redacted]d  6 lb 1 oz (2.75 kg) F Vag-Spont None  LIV  ? ? ?Past Medical History:  ?Diagnosis Date  ? Abnormal Pap smear   ? Depression   ? no meds   ? Hx MRSA infection   ? History only - 2 Negative MRSA swabs in 2012  ? Iron deficiency anemia   ? Post-partum depression   ? hx  ? STD (female)   ? chlamydia and trich  ? SVD (spontaneous vaginal delivery)   ? x 5  ? ? ?Past Surgical History:  ?Procedure Laterality Date  ? CERVICAL BIOPSY  W/ LOOP ELECTRODE EXCISION    ? finger nail removed    ? I & D EXTREMITY Right 04/13/2015  ? Procedure: RIGHT LONG FINGER DEBRIDEMENT AND FLEXOR TENDON SHEATH DRAINAGE;  Surgeon: 06/13/2015, MD;  Location: [facility] OR;  Service:  Orthopedics;  Laterality: Right;  ? INCISION AND DRAINAGE Right 04/13/2015  ? long finger excisional debridement of skin and subcutaneous tissues, with irrigation/drainage of the flexor tendon sheath, and removal of nail plate  ? LAPAROSCOPIC TUBAL LIGATION Bilateral 12/02/2013  ? Procedure: LAPAROSCOPIC TUBAL LIGATION WITH FILSCHE CLIPS;  Surgeon: 12/04/2013, MD;  Location: [facility];  Service: Gynecology;  Laterality: Bilateral;  FILSCHE CLIPS  ? TUBAL LIGATION Bilateral   ? WISDOM TOOTH EXTRACTION    ? ? ?Current Outpatient Medications on File Prior to Visit  ?Medication Sig Dispense Refill  ? buPROPion (WELLBUTRIN SR) 150 MG 12 hr tablet Take 150 mg by mouth 2 (two) times daily. (Patient not taking: Reported on 10/11/2021)  3  ? cefTRIAXone 2 g in dextrose 5 % 50 mL Inject 2 g into the vein daily. (Patient not taking: Reported on 10/11/2021) 28 g 3  ? diphenhydrAMINE (BENADRYL) 25 MG tablet Take 1 tablet (25 mg total) by mouth every 6 (six) hours as needed for up to 3 days. 12 tablet 0  ? famotidine (PEPCID) 20 MG tablet Take 1 tablet (20 mg total) by mouth 2 (two) times daily for 3  days. 6 tablet 0  ? ferrous sulfate 325 (65 FE) MG tablet Take 1 tablet (325 mg total) by mouth daily with breakfast. (Patient not taking: Reported on 10/11/2021) 30 tablet 3  ? ibuprofen (ADVIL,MOTRIN) 600 MG tablet Take 1 tablet (600 mg total) by mouth every 6 (six) hours as needed. (Patient not taking: Reported on 10/11/2021) 30 tablet 0  ? LATUDA 20 MG TABS tablet Take 20 mg by mouth daily. (Patient not taking: Reported on 10/11/2021)  3  ? lidocaine (LIDODERM) 5 % Place 1 patch onto the skin daily. Remove & Discard patch within 12 hours or as directed by MD (Patient not taking: Reported on 10/11/2021) 30 patch 0  ? meloxicam (MOBIC) 15 MG tablet Take 1 tablet (15 mg total) by mouth daily. (Patient not taking: Reported on 10/11/2021) 30 tablet 1  ? meloxicam (MOBIC) 15 MG tablet Take 1 tablet (15 mg total) by mouth daily. (Patient not taking:  Reported on 10/11/2021) 30 tablet 0  ? ondansetron (ZOFRAN) 4 MG tablet Take 1 tablet (4 mg total) by mouth every 6 (six) hours as needed for nausea. (Patient not taking: Reported on 10/11/2021) 20 tablet 0  ? ?No current facility-administered medications on file prior to visit.  ? ? ?No Known Allergies ? ?Social History:  reports that she has never smoked. She has never used smokeless tobacco. She reports current alcohol use. She reports that she does not use drugs. ? ?Family History  ?Problem Relation Age of Onset  ? Cancer Paternal Grandmother   ? Hypertension Maternal Grandmother   ? Heart disease Maternal Grandmother   ? Diabetes Maternal Grandmother   ? Stroke Maternal Grandmother   ? Cancer Maternal Grandfather   ? Hypertension Mother   ? ? ?The following portions of the patient's history were reviewed and updated as appropriate: allergies, current medications, past family history, past medical history, past social history, past surgical history and problem list. ? ?Review of Systems ?Pertinent items noted in HPI and remainder of comprehensive ROS otherwise negative. ? ?Physical Exam:  ?BP 117/75   Pulse 94   Wt 172 lb (78 kg)   BMI 22.69 kg/m?  ?CONSTITUTIONAL: Well-developed, well-nourished female in no acute distress.  ?HENT:  Normocephalic, atraumatic, External right and left ear normal.  ?EYES: Conjunctivae and EOM are normal. Pupils are equal, round, and reactive to light. No scleral icterus.  ?NECK: Normal range of motion, supple, no masses.  Normal thyroid.  ?SKIN: Skin is warm and dry. No rash noted. Not diaphoretic. No erythema. No pallor. ?MUSCULOSKELETAL: Normal range of motion. No tenderness.  No cyanosis, clubbing, or edema. ?NEUROLOGIC: Alert and oriented to person, place, and time. Normal reflexes, muscle tone coordination.  ?PSYCHIATRIC: Normal mood and affect. Normal behavior. Normal judgment and thought content. ?CARDIOVASCULAR: Normal heart rate noted, regular rhythm ?RESPIRATORY: Clear to  auscultation bilaterally. Effort and breath sounds normal, no problems with respiration noted. ?BREASTS: Symmetric in size. No masses, tenderness, skin changes, nipple drainage, or lymphadenopathy bilaterally. Performed in the presence of a chaperone. ?ABDOMEN: Soft, no distention noted.  No tenderness, rebound or guarding.  ?PELVIC: Normal appearing external genitalia and urethral meatus; normal appearing vaginal mucosa and cervix.  No abnormal vaginal discharge noted.  Pap smear obtained.  Normal uterine size, no other palpable masses, no uterine or adnexal tenderness.  Performed in the presence of a chaperone. ?  ?Assessment and Plan:  ?  1. Well woman exam with routine gynecological exam ?- No abnormal findings on physical exam ?-  Cytology - PAP( [place]) ?- CBC ?- Hemoglobin A1c ?- Comprehensive metabolic panel ?- Lipid panel ?- VITAMIN D 25 Hydroxy (Vit-D Deficiency, Fractures) ? ?2. Breast pain ?- Recurrent x 3 months. Family history  ?- MM DIAG BREAST TOMO BILATERAL; Future ?- US BREAST LTD UNI RIGHT INC AXILLA; Future ? ?3. H/O LEEP ? ? ?4. Hx of tubal ligation ?- With Filsche clips ? ?5. Menorrhagia with regular cycle ?- Reviewed UTD recommendations for management of heavy periods s/p BTL ?- Encouraged patient to consider hormonal IUD ? ?Will follow up results of pap smear and manage accordingly. ?Mammogram scheduled ?Marland Kitchen ?Routine preventative health maintenance measures emphasized. ?Please refer to After Visit Summary for other counseling recommendations.  ?   ?Patient to RTC for IUD placement, MD or APP ? ?Clayton Bibles, MSA, MSN, CNM ?Certified Nurse Midwife, Faculty Practice ?Center for [employer], [facility] ? ? ?

## 2021-10-12 ENCOUNTER — Encounter (HOSPITAL_COMMUNITY): Payer: Self-pay | Admitting: Nurse Practitioner

## 2021-10-12 ENCOUNTER — Other Ambulatory Visit: Payer: Self-pay

## 2021-10-12 ENCOUNTER — Telehealth: Payer: Self-pay

## 2021-10-12 ENCOUNTER — Ambulatory Visit (HOSPITAL_COMMUNITY)
Admission: EM | Admit: 2021-10-12 | Discharge: 2021-10-12 | Disposition: A | Discharge status: Home / Self Care | Payer: Medicaid Other | Attending: Nurse Practitioner | Admitting: Nurse Practitioner

## 2021-10-12 DIAGNOSIS — H00011 Hordeolum externum right upper eyelid: Secondary | ICD-10-CM | POA: Diagnosis not present

## 2021-10-12 LAB — LIPID PANEL
Chol/HDL Ratio: 2.7 ratio (ref 0.0–4.4)
Cholesterol, Total: 172 mg/dL (ref 100–199)
HDL: 64 mg/dL (ref >39)
LDL Chol Calc (NIH): 91 mg/dL (ref 0–99)
Triglycerides: 96 mg/dL (ref 0–149)
VLDL Cholesterol Cal: 17 mg/dL (ref 5–40)

## 2021-10-12 LAB — COMPREHENSIVE METABOLIC PANEL
ALT: 14 IU/L (ref 0–32)
AST: 17 IU/L (ref 0–40)
Albumin/Globulin Ratio: 1.7 (ref 1.2–2.2)
Albumin: 4.7 g/dL (ref 3.8–4.8)
Alkaline Phosphatase: 72 IU/L (ref 44–121)
BUN/Creatinine Ratio: 10 (ref 9–23)
BUN: 11 mg/dL (ref 6–20)
Bilirubin Total: 0.7 mg/dL (ref 0.0–1.2)
CO2: 23 mmol/L (ref 20–29)
Calcium: 9.3 mg/dL (ref 8.7–10.2)
Chloride: 103 mmol/L (ref 96–106)
Creatinine, Ser: 1.05 mg/dL — ABNORMAL HIGH (ref 0.57–1.00)
Globulin, Total: 2.7 g/dL (ref 1.5–4.5)
Glucose: 104 mg/dL — ABNORMAL HIGH (ref 70–99)
Potassium: 3.7 mmol/L (ref 3.5–5.2)
Sodium: 140 mmol/L (ref 134–144)
Total Protein: 7.4 g/dL (ref 6.0–8.5)
eGFR: 71 mL/min/{1.73_m2} (ref >59)

## 2021-10-12 LAB — CBC
Hematocrit: 38.9 % (ref 34.0–46.6)
Hemoglobin: 13 g/dL (ref 11.1–15.9)
MCH: 29.9 pg (ref 26.6–33.0)
MCHC: 33.4 g/dL (ref 31.5–35.7)
MCV: 89 fL (ref 79–97)
Platelets: 191 10*3/uL (ref 150–450)
RBC: 4.35 x10E6/uL (ref 3.77–5.28)
RDW: 11.2 % — ABNORMAL LOW (ref 11.7–15.4)
WBC: 4.1 10*3/uL (ref 3.4–10.8)

## 2021-10-12 LAB — HEMOGLOBIN A1C
Est. average glucose Bld gHb Est-mCnc: 114 mg/dL
Hgb A1c MFr Bld: 5.6 % (ref 4.8–5.6)

## 2021-10-12 LAB — VITAMIN D 25 HYDROXY (VIT D DEFICIENCY, FRACTURES): Vit D, 25-Hydroxy: 19.8 ng/mL — ABNORMAL LOW (ref 30.0–100.0)

## 2021-10-12 MED ORDER — ERYTHROMYCIN 5 MG/GM OP OINT: 1.0000 "application " | TOPICAL_OINTMENT | Freq: Every day | OPHTHALMIC | 0 refills | Status: AC

## 2021-10-12 NOTE — ED Triage Notes (Signed)
Pt presents for stye on her right eye.  ?

## 2021-10-12 NOTE — Discharge Instructions (Addendum)
Apply medication as prescribed. ?Warm compresses to the affected eye 3-4 times daily until symptoms improve. ?May take ibuprofen or Tylenol for any pain, fever, or general discomfort. ?Follow-up if symptoms worsen or do not improve. ?

## 2021-10-12 NOTE — ED Provider Notes (Signed)
[facility]    CSN: [id number] Arrival date & time: 10/12/21  [id number]      History   Chief Complaint No chief complaint on file.   HPI [name] is a 36 y.o. female.   The patient is a 36 year old female who presents with upper eyelid swelling to the right eye.  He presents wearing sunglasses.  Symptoms started 1 day ago.  States "I have a stye over my eye, and I need you to drain it".  She denies pain, change in vision, loss of vision, eye drainage.  Her right upper eyelid is swollen.  She states that it is sometimes hard to open her eye due to the swelling.  He has not taken any medication or perform any intervention for her symptoms.  She does not wear glasses or contacts.     Past Medical History:  Diagnosis Date   Abnormal Pap smear    Depression    no meds    Hx MRSA infection    History only - 2 Negative MRSA swabs in 2012   Iron deficiency anemia    Post-partum depression    hx   STD (female)    chlamydia and trich   SVD (spontaneous vaginal delivery)    x 5    Patient Active Problem List   Diagnosis Date Noted   Acute cystitis without hematuria    Tenosynovitis of finger 04/15/2015   Osteomyelitis of finger (HCC)    Iron deficiency anemia 04/13/2015   Encounter for sterilization 12/02/2013   Contraception management 10/30/2013   Hepatitis B surface antigen positive 06/26/2012   Twin gestation, dichorionic/diamniotic (two placentae, 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   Thrombocytopenia (HCC) 04/05/2011   Abnormal Pap smear of cervix 04/05/2011   Hx LEEP (loop electrosurgical excision procedure), cervix, pregnancy 04/05/2011    Past Surgical History:  Procedure Laterality Date   CERVICAL BIOPSY  W/ LOOP ELECTRODE EXCISION     finger nail removed     I & D EXTREMITY Right 04/13/2015   Procedure: RIGHT LONG FINGER DEBRIDEMENT AND FLEXOR TENDON SHEATH DRAINAGE;  Surgeon: Mack Hookavid Thompson, MD;  Location: [facility];  Service: Orthopedics;  Laterality:  Right;   INCISION AND DRAINAGE Right 04/13/2015   long finger excisional debridement of skin and subcutaneous tissues, with irrigation/drainage of the flexor tendon sheath, and removal of nail plate   LAPAROSCOPIC TUBAL LIGATION Bilateral 12/02/2013   Procedure: LAPAROSCOPIC TUBAL LIGATION WITH FILSCHE CLIPS;  Surgeon: Adam PhenixJames G Arnold, MD;  Location: [facility];  Service: Gynecology;  Laterality: Bilateral;  FILSCHE CLIPS   TUBAL LIGATION Bilateral    WISDOM TOOTH EXTRACTION      OB History     Gravida  4   Para  4   Term  4   Preterm  0   AB  0   Living  5      SAB  0   IAB  0   Ectopic  0   Multiple  1   Live Births  5            Home Medications    Prior to Admission medications   Medication Sig Start Date End Date Taking? Authorizing Provider  erythromycin ophthalmic ointment Place 1 application. into the right eye at bedtime for 7 days. 10/12/21 10/19/21 Yes Kyshawn Teal-Warren, Sadie Haberhristie J, NP  buPROPion (WELLBUTRIN SR) 150 MG 12 hr tablet Take 150 mg by mouth 2 (two) times daily. Patient not taking: Reported on 10/11/2021 09/21/15  [provider]  cefTRIAXone 2 g in dextrose 5 % 50 mL Inject 2 g into the vein daily. Patient not taking: Reported on 10/11/2021 04/16/15   Valentino Nose, MD  diphenhydrAMINE (BENADRYL) 25 MG tablet Take 1 tablet (25 mg total) by mouth every 6 (six) hours as needed for up to 3 days. 08/06/18 08/09/18  Aviva Kluver B, PA-C  famotidine (PEPCID) 20 MG tablet Take 1 tablet (20 mg total) by mouth 2 (two) times daily for 3 days. 08/06/18 08/09/18  Aviva Kluver B, PA-C  ferrous sulfate 325 (65 FE) MG tablet Take 1 tablet (325 mg total) by mouth daily with breakfast. Patient not taking: Reported on 10/11/2021 04/16/15   Valentino Nose, MD  ibuprofen (ADVIL,MOTRIN) 600 MG tablet Take 1 tablet (600 mg total) by mouth every 6 (six) hours as needed. Patient not taking: Reported on 10/11/2021 11/19/15   Lyndal Pulley, MD  LATUDA 20 MG TABS tablet Take 20 mg  by mouth daily. Patient not taking: Reported on 10/11/2021 09/21/15   [provider]  lidocaine (LIDODERM) 5 % Place 1 patch onto the skin daily. Remove & Discard patch within 12 hours or as directed by MD Patient not taking: Reported on 10/11/2021 02/15/18   Gerhard Munch, MD  meloxicam (MOBIC) 15 MG tablet Take 1 tablet (15 mg total) by mouth daily. Patient not taking: Reported on 10/11/2021 04/16/15   Mack Hook, MD  meloxicam (MOBIC) 15 MG tablet Take 1 tablet (15 mg total) by mouth daily. Patient not taking: Reported on 10/11/2021 04/16/15   Valentino Nose, MD  ondansetron (ZOFRAN) 4 MG tablet Take 1 tablet (4 mg total) by mouth every 6 (six) hours as needed for nausea. Patient not taking: Reported on 10/11/2021 04/16/15   Valentino Nose, MD    Family History Family History  Problem Relation Age of Onset   Cancer Paternal Grandmother    Hypertension Maternal Grandmother    Heart disease Maternal Grandmother    Diabetes Maternal Grandmother    Stroke Maternal Grandmother    Cancer Maternal Grandfather    Hypertension Mother     Social History Social History   Tobacco Use   Smoking status: Never   Smokeless tobacco: Never  Substance Use Topics   Alcohol use: Yes    Comment: 04/13/2015 "stopped drinking in 03/2014"   Drug use: No     Allergies   Patient has no known allergies.   Review of Systems Review of Systems  Constitutional: Negative.   HENT: Negative.    Eyes:  Positive for redness. Negative for photophobia, pain, discharge, itching and visual disturbance.       Right upper eyelid swelling  Respiratory: Negative.    Cardiovascular: Negative.   Skin:        Swelling to right upper eyelid  Psychiatric/Behavioral: Negative.      Physical Exam Triage Vital Signs ED Triage Vitals  Enc Vitals Group     BP 10/12/21 0835 123/79     Pulse Rate 10/12/21 0833 84     Resp 10/12/21 0833 16     Temp 10/12/21 0833 98.4 F (36.9 C)     Temp Source 10/12/21 0833  Oral     SpO2 10/12/21 0833 100 %     Weight --      Height --      Head Circumference --      Peak Flow --      Pain Score --      Pain Loc --  Pain Edu? --      Excl. in GC? --    No data found.  Updated Vital Signs BP 123/79 (BP Location: Left Arm)    Pulse 84    Temp 98.4 F (36.9 C) (Oral)    Resp 16    SpO2 100%   Visual Acuity Right Eye Distance:   Left Eye Distance:   Bilateral Distance:    Right Eye Near:   Left Eye Near:    Bilateral Near:     Physical Exam Constitutional:      General: She is not in acute distress.    Appearance: Normal appearance.  HENT:     Head: Normocephalic and atraumatic.     Right Ear: Tympanic membrane, ear canal and external ear normal.     Left Ear: Tympanic membrane, ear canal and external ear normal.  Eyes:     General: Vision grossly intact.        Right eye: Hordeolum present.     Extraocular Movements: Extraocular movements intact.     Right eye: Normal extraocular motion and no nystagmus.     Left eye: Normal extraocular motion and no nystagmus.     Conjunctiva/sclera: Conjunctivae normal.     Pupils: Pupils are equal, round, and reactive to light.      Comments: Moderate swelling to right upper eyelid.  Hordeolum present to right upper eyelid.   Neurological:     Mental Status: She is alert.     UC Treatments / Results  Labs (all labs ordered are listed, but only abnormal results are displayed) Labs Reviewed - No data to display  EKG   Radiology No results found.  Procedures Procedures (including critical care time)  Medications Ordered in UC Medications - No data to display  Initial Impression / Assessment and Plan / UC Course  I have reviewed the triage vital signs and the nursing notes.  Pertinent labs & imaging results that were available during my care of the patient were reviewed by me and considered in my medical decision making (see chart for details).  Symptoms are consistent with  hordeolum to the right upper eyelid. Patient denies any visual changes, she does have some difficulty closing the right eyelid d/t swelling.  Will provide prescription for erythromycin ointment.  Patient encouraged to apply warm compresses to the right eye 3-4 times daily until symptoms improve.  Patient instructed to perform strict hand hygiene when administering medication to the eye.  Patient will follow-up if symptoms worsen or do not improve. Final Clinical Impressions(s) / UC Diagnoses   Final diagnoses:  Hordeolum externum of right upper eyelid     Discharge Instructions      Apply medication as prescribed. Warm compresses to the affected eye 3-4 times daily until symptoms improve. May take ibuprofen or Tylenol for any pain, fever, or general discomfort. Follow-up if symptoms worsen or do not improve.   ED Prescriptions     Medication Sig Dispense Auth. Provider   erythromycin ophthalmic ointment Place 1 application. into the right eye at bedtime for 7 days. 7 g Icis Budreau-Warren, Sadie Haber, NP      PDMP not reviewed this encounter.   Abran Cantor, NP 10/12/21 [phone]

## 2021-10-12 NOTE — Telephone Encounter (Addendum)
-----   Message from Calvert Cantor, [place] sent at 10/12/2021  8:26 AM EST ----- ?Overall reassuring lab results. Vitamin D is a little low. Patient does not have MyChart. Can you please reach out and recommend a daily D3 supplement, OTC is fine. She can take [phone] units daily and we can recheck in 3-4 months. Thank you! SW ? ? ?Called pt with results and provider recommendation.  ?

## 2021-10-13 LAB — CYTOLOGY - PAP
Chlamydia: NEGATIVE
Comment: NEGATIVE
Comment: NEGATIVE
Comment: NORMAL
Diagnosis: NEGATIVE
High risk HPV: NEGATIVE
Neisseria Gonorrhea: NEGATIVE

## 2021-10-24 ENCOUNTER — Ambulatory Visit
Admission: RE | Admit: 2021-10-24 | Discharge: 2021-10-24 | Disposition: A | Discharge status: Home / Self Care | Payer: Medicaid Other | Source: Ambulatory Visit | Attending: Advanced Practice Midwife | Admitting: Advanced Practice Midwife

## 2021-10-24 ENCOUNTER — Other Ambulatory Visit: Payer: Medicaid Other

## 2021-10-24 ENCOUNTER — Ambulatory Visit: Payer: Medicaid Other

## 2021-10-24 DIAGNOSIS — N644 Mastodynia: Secondary | ICD-10-CM

## 2021-11-15 ENCOUNTER — Ambulatory Visit (INDEPENDENT_AMBULATORY_CARE_PROVIDER_SITE_OTHER): Payer: Medicaid Other | Admitting: Family Medicine

## 2021-11-15 ENCOUNTER — Encounter: Payer: Self-pay | Admitting: Family Medicine

## 2021-11-15 ENCOUNTER — Telehealth: Payer: Self-pay | Admitting: Clinical

## 2021-11-15 VITALS — BP 128/72 | HR 78 | Wt 176.6 lb

## 2021-11-15 DIAGNOSIS — F32A Depression, unspecified: Secondary | ICD-10-CM

## 2021-11-15 DIAGNOSIS — Z30430 Encounter for insertion of intrauterine contraceptive device: Secondary | ICD-10-CM | POA: Insufficient documentation

## 2021-11-15 LAB — POCT PREGNANCY, URINE: Preg Test, Ur: NEGATIVE

## 2021-11-15 MED ORDER — LEVONORGESTREL 20.1 MCG/DAY IU IUD
1.0000 | INTRAUTERINE_SYSTEM | Freq: Once | INTRAUTERINE | Status: AC
  Administered 2021-11-15: 1 via INTRAUTERINE

## 2021-11-15 NOTE — Progress Notes (Signed)
? ? ?  GYNECOLOGY OFFICE PROCEDURE NOTE ? ?[name] is a 36 y.o. [phone] here for Liletta IUD insertion. No GYN concerns.  Last pap smear: ? ?Lab Results  ?Component Value Date  ? DIAGPAP  10/11/2021  ?  - Negative for intraepithelial lesion or malignancy (NILM)  ? HPVHIGH Negative 10/11/2021  ? ? ?Urine pregnancy test: negative ? ?IUD Insertion Procedure Note ?Patient identified, informed consent performed, consent signed.   Discussed risks of irregular bleeding, increased cramping, infection, malpositioning or misplacement of the IUD outside the uterus which may require further procedure such as laparoscopy. Also discussed >99% contraception efficacy, increased risk of ectopic pregnancy with failure of method.  Time out was performed. ? ?Speculum placed in the vagina.  Cervix visualized.  Cleaned with Betadine x 2.  Grasped anteriorly with a single tooth tenaculum.  Uterus sounded to 9 cm. IUD placed per manufacturer's recommendations.  Strings trimmed to 3 cm. Tenaculum was removed, good hemostasis noted.  Patient tolerated procedure well.  ? ?Patient was given post-procedure instructions.  She was advised to have backup contraception for one week.  Patient was also asked to check IUD strings periodically and follow up in 4 weeks for IUD check. ? ?Venora Maples, MD/MPH ?Attending Family Medicine Physician, Faculty Practice ?Center for [employer], [facility] ? ?

## 2021-11-15 NOTE — Telephone Encounter (Signed)
Attempt to schedule, per referral. Unable to leave MyChart message as MyChart has not been set up. First call said "call cannot be completed at this time"; second call, pt said to call her back after 1pm, as she was currently at work. Call at 1:06 pm call said "call cannot be completed at this time", so unable to leave voice message.  ?
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
## 2021-11-28 ENCOUNTER — Ambulatory Visit (INDEPENDENT_AMBULATORY_CARE_PROVIDER_SITE_OTHER): Payer: Self-pay | Admitting: Clinical

## 2021-11-28 ENCOUNTER — Telehealth: Payer: Self-pay | Admitting: Clinical

## 2021-11-28 DIAGNOSIS — Z658 Other specified problems related to psychosocial circumstances: Secondary | ICD-10-CM

## 2021-11-28 DIAGNOSIS — F4321 Adjustment disorder with depressed mood: Secondary | ICD-10-CM

## 2021-11-28 NOTE — Addendum Note (Signed)
Addended by: Vesta Mixer C on: 11/28/2021 10:17 AM ? ? Modules accepted: Orders ? ?

## 2021-11-28 NOTE — Patient Instructions (Signed)
[facility] ?[address] ?[place], [place] [address] ?[phone] (main office) ?[phone] (Recie Cirrincione's office) ? ?Housing Resources ?                   ?[facility] (serves , [place], [place], [place], [place], [place], [place], [place], [place], [place], [place], [place], and [place] counties) ?[address], [place], [place] [address] ?[phone] ?www.ptrc.org  ?**Rental assistance, Home Rehabilitation,Weatherization Assistance Program, Heating Appliance Repair and Replacement Program, Housing Voucher Program ? ?[employer] for [facility] and Community Studies: ?Emergency Rental Assistance Resources to residents of [place], [place], and [place] ?Make sure you have your documents ready, including:  ?(Household income verification: 2 months pay stubs, unemployment/social security award letter, statement of no income for all household members over 18) ?Photo ID for all household members over 18 ?Utility Bill/Rent Ledger/Lease: must show past due amount for utilities/rent, or the rental agreement if rent is current ?2. Start your application online or by paper (in English or Spanish) at:  ?   [email or web address]  ?3. Once you have completed the online application, you will get an email confirmation message from the county. Expect to hear back by phone or email at least 6-10 weeks from submitting your application.  ?4. While you wait:  ?Call [phone] to check in on your application ?Let your landlord know that you've applied. Your landlord will be asked to submit documents (W-9) during this application process. Payments will be made directly to the landlord/property management company and utility company ?Rent or utility assistance for [place], [place], and [place] ?Apply at [email or web address] ?Questions? Call or email Renee at  [phone] or [email or web address]  ? ?Eviction Mediation Program: The HOPE Program ?Https://www.rebuild.[place].gov/hope-program ?HOPE Progam serves low-income renters in [place] counties, defined as less than or equal to 80% of the area median income for the county where the renter lives. In the following 12 counties, you should apply to your local rent and utility assistance program INSTEAD OF the HOPE Program: [place], [place], [place], [place], [place], [place], [place], [place], [place], [place], [place], [place]  ?If you live outside of [place], contact HOPE call center at [phone] to talk to a Program Representative Monday-Friday, 8am-5pm ?Note that Native American tribes also received federal funding for rent and utility assistance programs. Recognized members of the following tribes will be served by programs managed by tribal governments, including: Eastern Band of [place] Indians, [place] Tribe, [place] Indian Tribe, [place] Tribe of [place] and [place] Tribe ?Eviction Mediation Coordinator, Renee, [phone] [email or web address] ?Housing Resources Navigator, Stefanie, [phone] [email or web address]  ? ?Housing Resources [place] ? ?[facility] [place] ?[address], [place], [place] [address] ?(336) [phone] ?www.gha-[place].org  ? ?[facility] ?[address], [place], [place] [address] ?(336) [phone] ?www.gsohc.org ?**Programs include: Foreclosure Prevention and Housing Counseling, Healthy Homes/Tenant Advocacy, Homeless Prevention and Housing Assistance ? ?[facility] ?[address], [place], [place] [address] ?(336) [phone] ?www.[place].gov ?**housing applications/recertification; tax payment relief/exemption under specific qualifications ? ?[facility] ?[address] [place] [address], [place], [place] [address] ?www.online[place].com/~maryshouse ?**transitional housing for  women in recovery who have minor children or are pregnant ? ?[facility] ?[address], [place], [place] [address] ?www.ywcagsonc.org  ?**emergency shelter and support services for families facing homelessness ? ?[facility] ?[address], , [place] [address] ?(336) [phone] ?www.youthfocus.org ?**transitional housing to pregnant women; emergency housing for youth who have run away, are experiencing a family   crisis, are victims of abuse or neglect, or are homeless ? ?[facility] ?[address], [place], [place] [address] ?([phone] ?ircgso.org ?**Drop-in center for people experiencing homelessness; overnight warming center when temperature is 25 degrees or below ? ?[facility] ?[address] [place], [place] [address] ?([phone] ?https://reentrystaffingagency.org/ ?**help with affordable housing to people experiencing homelessness or unemployment due to incarceration ? ?[employer] ?[address], [place], [place] [address] ?((450)823-2694 ?www.greensborourbanministry.org  ?**emergency and transitional housing, rent/mortgage assistance, utility assistance ? ?[facility] ?[address], [place], [place] [address] ?(w36) 062-6948 ?www.salvationarmyofg[place].org ?**emergency and transitional housing ? ?[facility] ?[address], [place], [place] [address] ?((831) [phone] ?Www.habitatgreensboro.org  ? ?[employer] ?[address], [place], [place] [address] ?((214) [phone] ?https://chshousing.org ?**Home Ownership/Affordable Housing Program and Home Repair Program ? ?[facility] ?[address], [place], [place] [address] ?([phone] ?www.TextRun.co.nz ?**home buyer education courses, foreclosure prevention ? ?[facility] ?[address], [place], [place] [address] ?((734)043-6833 ?DefMagazine.is ?**Environmental Exposure Assessment (investigation of homes where either children or pregnant women with a confirmed elevated blood lead level reside) ? ?[employer] [facility] ?[address] [place], [place] [address] ?(316-234-2599 ?ShowReturn.ca ?**Home Expense Assistance/Repairs Program; offers home accessibility updates, such as ramps or bars in the bathroom ? ?[facility] ?[address] [place], [place] [address] ?((712)762-5294 ?https://www.self-help.org/locations/-branch ?**Offers credit-building and banking services to people unable to use traditional banking ? ? ?[facility] [employer] ? ?[facility] [place] [facility] [employer] ?[address] [place], [place] [address] ?((502)[phone] ?ChatRepair.pl  ? ?[facility] [employer] [facility] ?[address], [place], [place] [address] ?([phone]  ?WrestlingMonthly.pl ?**housing applications/recertification, emergency and transitional housing ? ?[facility] [employer] ?[address], [place], [place] [address] ?([place] ?www.odm-hp.org  ?**emergency and permanent housing; rent/mortgage payment assistance ? ?[facility] [employer], [place] [facility] [place] ?[address] [place], [place] [address] ?([place] ?https://russell-walls.com/ ? ?[facility] [place] [employer] ?[address] [place], [place], [place] [address] ?Investment banker, operational.familyservice-piedmont.org ?**emergency shelter for victims of domestic violence and sexual assault ? ?[facility] ?[address] [place], [place] [address] ?([place] ?www.senior-resources-[place].org ?**Home expense assistance/repairs for older adults ? ?[facility] [place] ? ?[facility] [place] ?[address] [place], [place]  [address] ?([phone] ?www.shepctrkville.com  ?**May offer help with minor housing repairs ? ?[facility] ?[address] [place], [place] [address] ?([phone] ?**emergency housing for victims of domestic violence ? ?[facility] [employer]

## 2021-11-29 ENCOUNTER — Other Ambulatory Visit: Payer: Self-pay

## 2021-11-29 ENCOUNTER — Emergency Department (HOSPITAL_COMMUNITY)
Admission: EM | Admit: 2021-11-29 | Discharge: 2021-11-29 | Disposition: A | Discharge status: Home / Self Care | Payer: Medicaid Other | Attending: Emergency Medicine | Admitting: Emergency Medicine

## 2021-11-29 DIAGNOSIS — L509 Urticaria, unspecified: Secondary | ICD-10-CM | POA: Insufficient documentation

## 2021-11-29 DIAGNOSIS — R21 Rash and other nonspecific skin eruption: Secondary | ICD-10-CM | POA: Diagnosis present

## 2021-11-29 MED ORDER — PREDNISONE 20 MG PO TABS
40.0000 mg | ORAL_TABLET | Freq: Once | ORAL | Status: AC
  Administered 2021-11-29: 40 mg via ORAL
  Filled 2021-11-29: qty 2

## 2021-11-29 MED ORDER — DIPHENHYDRAMINE HCL 25 MG PO TABS: 50.0000 mg | ORAL_TABLET | Freq: Three times a day (TID) | ORAL | 0 refills | Status: DC | PRN

## 2021-11-29 MED ORDER — DIPHENHYDRAMINE HCL 25 MG PO CAPS
25.0000 mg | ORAL_CAPSULE | Freq: Once | ORAL | Status: AC
  Administered 2021-11-29: 25 mg via ORAL
  Filled 2021-11-29: qty 1

## 2021-11-29 MED ORDER — PREDNISONE 10 MG PO TABS: 40.0000 mg | ORAL_TABLET | Freq: Every day | ORAL | 0 refills | Status: DC

## 2021-11-29 MED ORDER — DEXAMETHASONE SODIUM PHOSPHATE 10 MG/ML IJ SOLN
10.0000 mg | Freq: Once | INTRAMUSCULAR | Status: DC
Start: 2021-11-29 — End: 2021-11-29

## 2021-11-29 NOTE — ED Provider Notes (Signed)
?[facility] ?Provider Note ? ? ?CSN: [id number] ?Arrival date & time: 11/29/21  1952 ? ?  ? ?History ? ?Chief Complaint  ?Patient presents with  ? Allergic Reaction  ? ? ?[name] is a 36 y.o. female. ? ?36 year old female with prior medical history as detailed below presents for her evaluation.  Patient complains of diffuse urticaria for the last 48 hours. ? ?She denies respiratory symptoms.  She denies difficulty swallowing or breathing.  She denies other complaint. ? ?She denies prior history of significant urticaria as she is currently experiencing.  She has not taken anything at home for her symptoms. ? ?The history is provided by the patient and medical records.  ?Allergic Reaction ?Presenting symptoms: itching and rash   ?Severity:  Mild ?Duration:  2 days ?Prior allergic episodes:  No prior episodes ?Relieved by:  Nothing ?Worsened by:  Nothing ? ?  ? ?Home Medications ?Prior to Admission medications   ?Medication Sig Start Date End Date Taking? Authorizing Provider  ?diphenhydrAMINE (BENADRYL) 25 MG tablet Take 2 tablets (50 mg total) by mouth every 8 (eight) hours as needed for itching. 11/29/21  Yes Valarie Merino, MD  ?predniSONE (DELTASONE) 10 MG tablet Take 4 tablets (40 mg total) by mouth daily. 11/29/21  Yes Valarie Merino, MD  ?cefTRIAXone 2 g in dextrose 5 % 50 mL Inject 2 g into the vein daily. ?Patient not taking: Reported on 10/11/2021 04/16/15   Maryellen Pile, MD  ?diphenhydrAMINE (BENADRYL) 25 MG tablet Take 1 tablet (25 mg total) by mouth every 6 (six) hours as needed for up to 3 days. 08/06/18 08/09/18  Langston Masker B, PA-C  ?famotidine (PEPCID) 20 MG tablet Take 1 tablet (20 mg total) by mouth 2 (two) times daily for 3 days. 08/06/18 08/09/18  Langston Masker B, PA-C  ?ferrous sulfate 325 (65 FE) MG tablet Take 1 tablet (325 mg total) by mouth daily with breakfast. ?Patient not taking: Reported on 10/11/2021 04/16/15   Maryellen Pile, MD  ?ibuprofen  (ADVIL,MOTRIN) 600 MG tablet Take 1 tablet (600 mg total) by mouth every 6 (six) hours as needed. ?Patient not taking: Reported on 10/11/2021 11/19/15   Leo Grosser, MD  ?lidocaine (LIDODERM) 5 % Place 1 patch onto the skin daily. Remove & Discard patch within 12 hours or as directed by MD ?Patient not taking: Reported on 10/11/2021 02/15/18   Carmin Muskrat, MD  ?meloxicam (MOBIC) 15 MG tablet Take 1 tablet (15 mg total) by mouth daily. ?Patient not taking: Reported on 10/11/2021 04/16/15   Milly Jakob, MD  ?meloxicam (MOBIC) 15 MG tablet Take 1 tablet (15 mg total) by mouth daily. ?Patient not taking: Reported on 10/11/2021 04/16/15   Maryellen Pile, MD  ?ondansetron (ZOFRAN) 4 MG tablet Take 1 tablet (4 mg total) by mouth every 6 (six) hours as needed for nausea. ?Patient not taking: Reported on 10/11/2021 04/16/15   Maryellen Pile, MD  ?   ? ?Allergies    ?Patient has no known allergies.   ? ?Review of Systems   ?Review of Systems  ?Skin:  Positive for itching and rash.  ?All other systems reviewed and are negative. ? ?Physical Exam ?Updated Vital Signs ?BP (!) 129/100   Pulse 79   Temp 98.2 ?F (36.8 ?C) (Oral)   Resp 16   LMP 11/09/2021 (Exact Date)   SpO2 100%  ?Physical Exam ?Vitals and nursing note reviewed.  ?Constitutional:   ?   General: She is not in acute  distress. ?   Appearance: Normal appearance. She is well-developed.  ?HENT:  ?   Head: Normocephalic and atraumatic.  ?Eyes:  ?   Conjunctiva/sclera: Conjunctivae normal.  ?   Pupils: Pupils are equal, round, and reactive to light.  ?Cardiovascular:  ?   Rate and Rhythm: Normal rate and regular rhythm.  ?   Heart sounds: Normal heart sounds.  ?Pulmonary:  ?   Effort: Pulmonary effort is normal. No respiratory distress.  ?   Breath sounds: Normal breath sounds.  ?Abdominal:  ?   General: There is no distension.  ?   Palpations: Abdomen is soft.  ?   Tenderness: There is no abdominal tenderness.  ?Musculoskeletal:     ?   General: No deformity. Normal range  of motion.  ?   Cervical back: Normal range of motion and neck supple.  ?Skin: ?   General: Skin is warm and dry.  ?   Comments: Mild scattered diffuse urticaria noted to the patient's upper extremities and thorax.  ?Neurological:  ?   General: No focal deficit present.  ?   Mental Status: She is alert and oriented to person, place, and time.  ? ? ?ED Results / Procedures / Treatments   ?Labs ?(all labs ordered are listed, but only abnormal results are displayed) ?Labs Reviewed - No data to display ? ?EKG ?None ? ?Radiology ?No results found. ? ?Procedures ?Procedures  ? ? ?Medications Ordered in ED ?Medications  ?predniSONE (DELTASONE) tablet 40 mg (has no administration in time range)  ?diphenhydrAMINE (BENADRYL) capsule 25 mg (25 mg Oral Given 11/29/21 2108)  ? ? ?ED Course/ Medical Decision Making/ A&P ?  ?                        ?Medical Decision Making ?Risk ?OTC drugs. ?Prescription drug management. ? ? ? ?Medical Screen Complete ? ?This patient presented to the ED with complaint of urticaria. ? ?This complaint involves an extensive number of treatment options. The initial differential diagnosis includes, but is not limited to, urticaria ? ?This presentation is: Acute, Self-Limited, Previously Undiagnosed, Uncertain Prognosis, and Systemic Symptoms ? ?Patient without prior history of urticarial reaction presents with same. ? ?Symptoms and ongoing for the last 48 hours. ? ?Patient without evidence of anaphylaxis or other significant allergic response. ? ?Patient would benefit from a course of Benadryl and prednisone. ? ?Patient understands need for close follow-up in the outpatient setting.  Strict return precautions given and understood. ? ?Additional history obtained: ? ?External records from outside sources obtained and reviewed including prior ED visits and prior Inpatient records.  ? ? ?Medicines ordered: ? ?I ordered medication including Benadryl, prednisone for allergic reaction ?Reevaluation of the  patient after these medicines showed that the patient: improved ? ? ?Problem List / ED Course: ? ?Urticarial reaction ? ? ?Reevaluation: ? ?After the interventions noted above, I reevaluated the patient and found that they have: improved ? ? ?Disposition: ? ?After consideration of the diagnostic results and the patients response to treatment, I feel that the patent would benefit from close outpatient follow-up.  ? ? ? ? ? ? ? ? ?Final Clinical Impression(s) / ED Diagnoses ?Final diagnoses:  ?Urticaria  ? ? ?Rx / DC Orders ?ED Discharge Orders   ? ?      Ordered  ?  diphenhydrAMINE (BENADRYL) 25 MG tablet  Every 8 hours PRN       ? 11/29/21 2202  ?  predniSONE (  DELTASONE) 10 MG tablet  Daily       ? 11/29/21 2202  ? ?  ?  ? ?  ? ? ?  ?Valarie Merino, MD ?11/29/21 2213 ? ?

## 2021-11-29 NOTE — ED Triage Notes (Signed)
Pt here for allergic reaction w/ hives to R forearm, bilateral neck and all over back. Pt was on vacation over the weekend and used soap at a hotel, has had hives since Monday, pt has not taken any medications. Denies shob/cp, just endorses itching. No known allergies. ?

## 2021-11-29 NOTE — Discharge Instructions (Signed)
Return for any problem.  ?

## 2021-11-29 NOTE — ED Provider Triage Note (Signed)
Emergency Medicine Provider Triage Evaluation Note ? ?[name] , a 36 y.o. female  was evaluated in triage.  Pt complains of hives to bilateral forearms, back and bilateral sides of her neck.  Patient states that she recently went to the beach, used new soap while at the beach.  Patient also states that she recently started using a new detergent.  The patient denies any symptoms of anaphylaxis, angioedema, throat closure, nausea, vomiting, abdominal pain, diarrhea. ? ?Review of Systems  ?Positive:  ?Negative:  ? ?Physical Exam  ?BP (!) 129/100   Pulse 79   Temp 98.2 ?F (36.8 ?C) (Oral)   Resp 16   LMP 11/09/2021 (Exact Date)   SpO2 100%  ?Gen:   Awake, no distress   ?Resp:  Normal effort  ?MSK:   Moves extremities without difficulty  ?Other:  Patient lung sounds clear bilaterally.  Patient airway intact.  Patient has urticarial hives noted to bilateral forearms, bilateral sides of her neck as well as her back ? ?Medical Decision Making  ?Medically screening exam initiated at 8:37 PM.  Appropriate orders placed.  [name] was informed that the remainder of the evaluation will be completed by another provider, this initial triage assessment does not replace that evaluation, and the importance of remaining in the ED until their evaluation is complete. ? ? ?  ?Al Decant, PA-C ?11/29/21 2038 ? ?

## 2021-11-29 NOTE — Telephone Encounter (Signed)
Call to give pt information about [facility] and walk-in hours, to establish care for [facility] medication management, as recommended by medical provider at [facility]. ?

## 2021-11-30 NOTE — BH Specialist Note (Signed)
Integrated Behavioral Health via Telemedicine Visit ? ?11/30/2021 ?[name] ?[id number] ? ?Number of Integrated Behavioral Health Clinician visits: 1- Initial Visit ? ?Session Start time: 0820 ?  ?Session End time: 0920 ? ?Total time in minutes: 60 ? ? ?Referring Provider: Merian Capron, MD ?Patient/Family location: Home ?[facility] Provider location: [facility] [employer] [facility] ? ?All persons participating in visit: Patient [name] and [facility] Kaidin Boehle  ? ?Types of Service: Individual psychotherapy and Telephone visit ? ?I connected with [name] and/or [name]  n/a  via  Telephone or Video Enabled Telemedicine Application  (Video is Caregility application) and verified that I am speaking with the correct person using two identifiers. Discussed confidentiality: Yes  ? ?I discussed the limitations of telemedicine and the availability of in person appointments.  Discussed there is a possibility of technology failure and discussed alternative modes of communication if that failure occurs. ? ?I discussed that engaging in this telemedicine visit, they consent to the provision of behavioral healthcare and the services will be billed under their insurance. ? ?Patient and/or legal guardian expressed understanding and consented to Telemedicine visit: Yes  ? ?Presenting Concerns: ?Patient and/or family reports the following symptoms/concerns: Stress and frustration at difficulty finding housing with current wages; goal is to find job with better pay, to work towards getting into her own home. Pt is taking OTC sleep medication with improved sleep. Passive SI with no intent and no plan. Pt is very motivated to work on her personal goals.  ?Duration of problem: Increasing ; Severity of problem:  moderately severe ? ?Patient and/or Family's Strengths/Protective Factors: ?Social connections, Sense of purpose, and Physical Health (exercise, healthy diet, medication  compliance, etc.) ? ?Goals Addressed: ?Patient will: ? Reduce symptoms of: anxiety, depression, and stress  ? Increase knowledge and/or ability of: stress reduction  ? Demonstrate ability to: Increase healthy adjustment to current life circumstances and Stay safe ? ?Progress towards Goals: ?Ongoing ? ?Interventions: ?Interventions utilized:  Solution-Focused Strategies ?Standardized Assessments completed: GAD-7 and PHQ 9 ? ?Patient and/or Family Response: Patient agrees with treatment plan. ? ? ?Assessment: ?Patient currently experiencing Psychosocial stress and Grief.  ? ?Patient may benefit from continued brief therapeutic interventions. ? ?Plan: ?Follow up with behavioral health clinician on : Two weeks ?Behavioral recommendations:  ?-Continue putting in applications on time off work; keeping a positive outlook during job search ?-Continue plan to use walk-in [facility] hours if needed ?-[employer] on next day off work, to obtain up-to-date housing list ?-Continue planning next beach trip with fiance ?-Continue using self-coping strategies daily (stress ball, etc.) ?Referral(s): Integrated [employer] (In Clinic) ? ?I discussed the assessment and treatment plan with the patient and/or parent/guardian. They were provided an opportunity to ask questions and all were answered. They agreed with the plan and demonstrated an understanding of the instructions. ?  ?They were advised to call back or seek an in-person evaluation if the symptoms worsen or if the condition fails to improve as anticipated. ? ?Rae Lips, LCSW ? ? ?  12/14/2021  ?  4:11 PM 11/28/2021  ?  9:57 AM  ?Depression screen PHQ 2/9  ?Decreased Interest 1 2  ?Down, Depressed, Hopeless 0 3  ?PHQ - 2 Score 1 5  ?Altered sleeping 0 3  ?Tired, decreased energy 1 2  ?Change in appetite 2 3  ?Feeling bad or failure about yourself  3 2  ?Trouble concentrating 0 0  ?Moving slowly or  fidgety/restless 3 1  ?Suicidal  thoughts 1 2  ?PHQ-9 Score 11 18  ? ? ?  12/14/2021  ?  4:24 PM 11/28/2021  ?  9:58 AM  ?GAD 7 : Generalized Anxiety Score  ?Nervous, Anxious, on Edge 3 3  ?Control/stop worrying 3 2  ?Worry too much - different things 3 2  ?Trouble relaxing 2 3  ?Restless 2 2  ?Easily annoyed or irritable 3 3  ?Afraid - awful might happen 1 1  ?Total GAD 7 Score 17 16  ? ? ? ?

## 2021-12-03 ENCOUNTER — Encounter (HOSPITAL_COMMUNITY): Payer: Self-pay | Admitting: Emergency Medicine

## 2021-12-03 ENCOUNTER — Emergency Department (HOSPITAL_COMMUNITY)
Admission: EM | Admit: 2021-12-03 | Discharge: 2021-12-03 | Disposition: A | Discharge status: Home / Self Care | Payer: Medicaid Other | Attending: Emergency Medicine | Admitting: Emergency Medicine

## 2021-12-03 ENCOUNTER — Other Ambulatory Visit: Payer: Self-pay

## 2021-12-03 DIAGNOSIS — R1111 Vomiting without nausea: Secondary | ICD-10-CM

## 2021-12-03 DIAGNOSIS — R112 Nausea with vomiting, unspecified: Secondary | ICD-10-CM | POA: Insufficient documentation

## 2021-12-03 DIAGNOSIS — Z79899 Other long term (current) drug therapy: Secondary | ICD-10-CM | POA: Insufficient documentation

## 2021-12-03 DIAGNOSIS — R55 Syncope and collapse: Secondary | ICD-10-CM | POA: Insufficient documentation

## 2021-12-03 LAB — URINALYSIS, ROUTINE W REFLEX MICROSCOPIC
Bilirubin Urine: NEGATIVE
Glucose, UA: NEGATIVE mg/dL
Ketones, ur: 5 mg/dL — AB
Leukocytes,Ua: NEGATIVE
Nitrite: NEGATIVE
Protein, ur: 30 mg/dL — AB
Specific Gravity, Urine: 1.034 — ABNORMAL HIGH (ref 1.005–1.030)
pH: 5 (ref 5.0–8.0)

## 2021-12-03 LAB — BASIC METABOLIC PANEL
Anion gap: 10 (ref 5–15)
BUN: 10 mg/dL (ref 6–20)
CO2: 23 mmol/L (ref 22–32)
Calcium: 9.2 mg/dL (ref 8.9–10.3)
Chloride: 107 mmol/L (ref 98–111)
Creatinine, Ser: 1.02 mg/dL — ABNORMAL HIGH (ref 0.44–1.00)
GFR, Estimated: >60 mL/min (ref >60)
Glucose, Bld: 106 mg/dL — ABNORMAL HIGH (ref 70–99)
Potassium: 3.6 mmol/L (ref 3.5–5.1)
Sodium: 140 mmol/L (ref 135–145)

## 2021-12-03 LAB — CBC
HCT: 39.9 % (ref 36.0–46.0)
Hemoglobin: 12.8 g/dL (ref 12.0–15.0)
MCH: 30 pg (ref 26.0–34.0)
MCHC: 32.1 g/dL (ref 30.0–36.0)
MCV: 93.7 fL (ref 80.0–100.0)
Platelets: 203 10*3/uL (ref 150–400)
RBC: 4.26 MIL/uL (ref 3.87–5.11)
RDW: 11.9 % (ref 11.5–15.5)
WBC: 10.4 10*3/uL (ref 4.0–10.5)
nRBC: 0 % (ref 0.0–0.2)

## 2021-12-03 LAB — I-STAT BETA HCG BLOOD, ED (MC, WL, AP ONLY): I-stat hCG, quantitative: <5 m[IU]/mL (ref <5)

## 2021-12-03 MED ORDER — LIDOCAINE VISCOUS HCL 2 % MT SOLN
15.0000 mL | Freq: Once | OROMUCOSAL | Status: AC
Start: 2021-12-03 — End: 2021-12-03
  Administered 2021-12-03: 15 mL via ORAL
  Filled 2021-12-03: qty 15

## 2021-12-03 MED ORDER — MAALOX MULTI SYMPTOM MAX ST 400-400-40 MG/5ML PO SUSP: 5.0000 mL | Freq: Four times a day (QID) | ORAL | 0 refills | Status: DC | PRN

## 2021-12-03 MED ORDER — ALUM & MAG HYDROXIDE-SIMETH 200-200-20 MG/5ML PO SUSP
30.0000 mL | Freq: Once | ORAL | Status: AC
Start: 2021-12-03 — End: 2021-12-03
  Administered 2021-12-03: 30 mL via ORAL
  Filled 2021-12-03: qty 30

## 2021-12-03 MED ORDER — SODIUM CHLORIDE 0.9% FLUSH
3.0000 mL | Freq: Once | INTRAVENOUS | Status: DC
Start: 2021-12-03 — End: 2021-12-03

## 2021-12-03 NOTE — ED Triage Notes (Signed)
Pt states she was seen in the ED a few days ago and given medications for "food poisoning".  States she took the medication today and while at work she had vomiting and a syncopal episode. Also reports sore throat and tongue swelling. ? ?According to chart pt was seen in ED on 4/25 for urticaria and given Rx for prednisone and benadryl. ? ?States she is unsure how long she was passed out for and denies injury. ? ?  ?

## 2021-12-03 NOTE — Progress Notes (Addendum)
Megan RN checked with MD, states PIV not needed. No IV meds ordered at this time. ?

## 2021-12-03 NOTE — Discharge Instructions (Signed)
You have been seen and discharged from the emergency department.  You may be experiencing irritation from the prednisone.  Complete prescribed medication as directed.  Use new medication as needed to settle your stomach.  Stay well-hydrated.  Follow-up with your primary provider for further evaluation and further care. Take home medications as prescribed. If you have any worsening symptoms or further concerns for your health please return to an emergency department for further evaluation. ?

## 2021-12-03 NOTE — ED Provider Notes (Signed)
?[facility] EMERGENCY DEPARTMENT ?Provider Note ? ? ?CSN: [id number] ?Arrival date & time: 12/03/21  1222 ? ?  ? ?History ? ?Chief Complaint  ?Patient presents with  ? Loss of Consciousness  ? ? ?[name] is a 36 y.o. female. ? ?HPI ? ?36 year old female presents emergency department after an episode of vomiting and near syncope.  Patient was seen here couple days ago with generalized urticaria/allergic reaction.  Placed on 5-day regimen of prednisone/Benadryl.  Patient states that she has been taking this medication with mild GI upset.  When she got to work today she had an episode of vomiting and then felt like she was going to pass out.  States that she lowered herself to the ground.  Unclear if she fully lost consciousness.  No preceding chest pain, palpitations, dizziness, headache.  She feels back to baseline besides some mild nausea currently.  No other recent fever or illness.  No history of syncope in the past. ? ?Home Medications ?Prior to Admission medications   ?Medication Sig Start Date End Date Taking? Authorizing Provider  ?alum & mag hydroxide-simeth (MAALOX MULTI SYMPTOM MAX ST) 400-400-40 MG/5ML suspension Take 5 mLs by mouth every 6 (six) hours as needed for indigestion. 12/03/21  Yes Bonny Egger, Clabe Seal, DO  ?cefTRIAXone 2 g in dextrose 5 % 50 mL Inject 2 g into the vein daily. ?Patient not taking: Reported on 10/11/2021 04/16/15   Valentino Nose, MD  ?diphenhydrAMINE (BENADRYL) 25 MG tablet Take 1 tablet (25 mg total) by mouth every 6 (six) hours as needed for up to 3 days. 08/06/18 08/09/18  Aviva Kluver B, PA-C  ?diphenhydrAMINE (BENADRYL) 25 MG tablet Take 2 tablets (50 mg total) by mouth every 8 (eight) hours as needed for itching. 11/29/21   Wynetta Fines, MD  ?famotidine (PEPCID) 20 MG tablet Take 1 tablet (20 mg total) by mouth 2 (two) times daily for 3 days. 08/06/18 08/09/18  Aviva Kluver B, PA-C  ?ferrous sulfate 325 (65 FE) MG tablet Take 1 tablet (325 mg total) by  mouth daily with breakfast. ?Patient not taking: Reported on 10/11/2021 04/16/15   Valentino Nose, MD  ?ibuprofen (ADVIL,MOTRIN) 600 MG tablet Take 1 tablet (600 mg total) by mouth every 6 (six) hours as needed. ?Patient not taking: Reported on 10/11/2021 11/19/15   Lyndal Pulley, MD  ?lidocaine (LIDODERM) 5 % Place 1 patch onto the skin daily. Remove & Discard patch within 12 hours or as directed by MD ?Patient not taking: Reported on 10/11/2021 02/15/18   Gerhard Munch, MD  ?meloxicam (MOBIC) 15 MG tablet Take 1 tablet (15 mg total) by mouth daily. ?Patient not taking: Reported on 10/11/2021 04/16/15   Mack Hook, MD  ?meloxicam (MOBIC) 15 MG tablet Take 1 tablet (15 mg total) by mouth daily. ?Patient not taking: Reported on 10/11/2021 04/16/15   Valentino Nose, MD  ?ondansetron (ZOFRAN) 4 MG tablet Take 1 tablet (4 mg total) by mouth every 6 (six) hours as needed for nausea. ?Patient not taking: Reported on 10/11/2021 04/16/15   Valentino Nose, MD  ?predniSONE (DELTASONE) 10 MG tablet Take 4 tablets (40 mg total) by mouth daily. 11/29/21   Wynetta Fines, MD  ?   ? ?Allergies    ?Patient has no known allergies.   ? ?Review of Systems   ?Review of Systems  ?Constitutional:  Negative for fever.  ?Eyes:  Negative for visual disturbance.  ?Respiratory:  Negative for chest tightness and shortness of breath.   ?Cardiovascular:  Negative for chest pain and leg swelling.  ?Gastrointestinal:  Positive for vomiting. Negative for abdominal pain.  ?Musculoskeletal:  Negative for back pain and neck pain.  ?Neurological:  Negative for weakness and headaches.  ? ?Physical Exam ?Updated Vital Signs ?BP 123/87   Pulse 64   Temp 98.8 ?F (37.1 ?C) (Oral)   Resp 18   LMP 11/09/2021 (Exact Date)   SpO2 99%  ?Physical Exam ?Vitals and nursing note reviewed.  ?Constitutional:   ?   General: She is not in acute distress. ?   Appearance: Normal appearance. She is not toxic-appearing.  ?HENT:  ?   Head: Normocephalic.  ?   Mouth/Throat:  ?    Mouth: Mucous membranes are moist.  ?Eyes:  ?   Pupils: Pupils are equal, round, and reactive to light.  ?Cardiovascular:  ?   Rate and Rhythm: Normal rate.  ?Pulmonary:  ?   Effort: Pulmonary effort is normal. No respiratory distress.  ?   Breath sounds: No rales.  ?Abdominal:  ?   Palpations: Abdomen is soft.  ?   Tenderness: There is no abdominal tenderness.  ?Musculoskeletal:  ?   Cervical back: No rigidity.  ?Skin: ?   General: Skin is warm.  ?Neurological:  ?   General: No focal deficit present.  ?   Mental Status: She is alert and oriented to person, place, and time. Mental status is at baseline.  ?Psychiatric:     ?   Mood and Affect: Mood normal.  ? ? ?ED Results / Procedures / Treatments   ?Labs ?(all labs ordered are listed, but only abnormal results are displayed) ?Labs Reviewed  ?BASIC METABOLIC PANEL - Abnormal; Notable for the following components:  ?    Result Value  ? Glucose, Bld 106 (*)   ? Creatinine, Ser 1.02 (*)   ? All other components within normal limits  ?CBC  ?URINALYSIS, ROUTINE W REFLEX MICROSCOPIC  ?I-STAT BETA HCG BLOOD, ED ([facility], [facility], [facility] ONLY)  ? ? ?EKG ?EKG Interpretation ? ?Date/Time:  Saturday December 03 2021 12:34:36 EDT ?Ventricular Rate:  75 ?PR Interval:  144 ?QRS Duration: 82 ?QT Interval:  364 ?QTC Calculation: 406 ?R Axis:   60 ?Text Interpretation: Normal sinus rhythm Normal ECG When compared with ECG of 15-Feb-2018 20:14, PREVIOUS ECG IS PRESENT Confirmed by Coralee PesaHorton, Veneda Kirksey [phone]) on 12/03/2021 1:22:11 PM ? ?Radiology ?No results found. ? ?Procedures ?Procedures  ? ? ?Medications Ordered in ED ?Medications  ?sodium chloride flush (NS) 0.9 % injection 3 mL (3 mLs Intravenous Not Given 12/03/21 1417)  ?alum & mag hydroxide-simeth (MAALOX/MYLANTA) 200-200-20 MG/5ML suspension 30 mL (30 mLs Oral Given 12/03/21 1428)  ?  And  ?lidocaine (XYLOCAINE) 2 % viscous mouth solution 15 mL (15 mLs Oral Given 12/03/21 1428)  ? ? ?ED Course/ Medical Decision Making/ A&P ?  ?                         ?Medical Decision Making ?Amount and/or Complexity of Data Reviewed ?Labs: ordered. ? ?Risk ?OTC drugs. ?Prescription drug management. ? ? ?36 year old female presents emergency department after an episode of emesis and near syncope.  Currently on prednisone and Benadryl for allergic reaction that was diagnosed 3 days ago.  States she is been compliant but experiencing mild GI irritation/nausea from the steroids. ? ?Patient had no presyncopal symptoms including chest pain, dizziness, palpitations.  Sounds like near syncope possibly secondary to vagal/vomiting.  After GI cocktail patient states  her symptoms are significantly better.  EKG is reassuring, no acute findings.  Blood work shows no abnormal findings, pregnancy test is negative.  Plan for symptomatic control and outpatient follow-up.  Doubt PE, PERC negative. ? ?Patient at this time appears safe and stable for discharge and close outpatient follow up. Discharge plan and strict return to ED precautions discussed, patient verbalizes understanding and agreement. ? ? ? ? ? ? ? ? ?Final Clinical Impression(s) / ED Diagnoses ?Final diagnoses:  ?Vomiting without nausea, unspecified vomiting type  ? ? ?Rx / DC Orders ?ED Discharge Orders   ? ?      Ordered  ?  alum & mag hydroxide-simeth (MAALOX MULTI SYMPTOM MAX ST) 400-400-40 MG/5ML suspension  Every 6 hours PRN       ? 12/03/21 1431  ? ?  ?  ? ?  ? ? ?  ?Rozelle Logan, DO ?12/03/21 1503 ? ?

## 2021-12-14 ENCOUNTER — Ambulatory Visit (INDEPENDENT_AMBULATORY_CARE_PROVIDER_SITE_OTHER): Payer: Medicaid Other | Admitting: Clinical

## 2021-12-14 DIAGNOSIS — Z658 Other specified problems related to psychosocial circumstances: Secondary | ICD-10-CM | POA: Diagnosis not present

## 2021-12-14 DIAGNOSIS — F4321 Adjustment disorder with depressed mood: Secondary | ICD-10-CM
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
## 2021-12-21 ENCOUNTER — Telehealth: Payer: Self-pay | Admitting: Clinical

## 2021-12-21 ENCOUNTER — Ambulatory Visit: Payer: Medicaid Other | Admitting: Family Medicine
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
## 2021-12-28 ENCOUNTER — Ambulatory Visit (INDEPENDENT_AMBULATORY_CARE_PROVIDER_SITE_OTHER): Payer: Medicaid Other | Admitting: Clinical

## 2021-12-28 DIAGNOSIS — F39 Unspecified mood [affective] disorder: Secondary | ICD-10-CM | POA: Diagnosis not present

## 2021-12-28 DIAGNOSIS — F4321 Adjustment disorder with depressed mood: Secondary | ICD-10-CM

## 2021-12-28 DIAGNOSIS — Z658 Other specified problems related to psychosocial circumstances: Secondary | ICD-10-CM

## 2021-12-28 DIAGNOSIS — F430 Acute stress reaction: Secondary | ICD-10-CM

## 2021-12-28 NOTE — Patient Instructions (Signed)
[facility] [address] [place], [place] [address] [phone] (main office) [phone] (Janneth Krasner's office)   

## 2022-01-11 ENCOUNTER — Other Ambulatory Visit: Payer: Self-pay | Admitting: *Deleted

## 2022-01-11 DIAGNOSIS — E559 Vitamin D deficiency, unspecified: Secondary | ICD-10-CM

## 2022-01-16 ENCOUNTER — Other Ambulatory Visit: Payer: Medicaid Other

## 2022-12-11 ENCOUNTER — Ambulatory Visit: Payer: Medicaid Other | Admitting: Obstetrics and Gynecology

## 2023-08-10 ENCOUNTER — Other Ambulatory Visit: Payer: Self-pay

## 2023-08-10 ENCOUNTER — Encounter (HOSPITAL_COMMUNITY): Payer: Self-pay | Admitting: *Deleted

## 2023-08-10 ENCOUNTER — Emergency Department (HOSPITAL_COMMUNITY)
Admission: EM | Admit: 2023-08-10 | Discharge: 2023-08-11 | Disposition: A | Discharge status: Home / Self Care | Payer: MEDICAID | Attending: Emergency Medicine | Admitting: Emergency Medicine

## 2023-08-10 DIAGNOSIS — Z5321 Procedure and treatment not carried out due to patient leaving prior to being seen by health care provider: Secondary | ICD-10-CM | POA: Diagnosis not present

## 2023-08-10 DIAGNOSIS — R1031 Right lower quadrant pain: Secondary | ICD-10-CM | POA: Diagnosis present

## 2023-08-10 MED ORDER — ACETAMINOPHEN 325 MG PO TABS
650.0000 mg | ORAL_TABLET | Freq: Once | ORAL | Status: AC
  Administered 2023-08-10: 650 mg via ORAL
  Filled 2023-08-10: qty 2

## 2023-08-10 NOTE — ED Triage Notes (Signed)
 The pt is c/o a pain in her rt groin it is located in her rt groin  no temp very painful   lmp none  iud

## 2023-08-11 NOTE — ED Notes (Signed)
 Pt did not respond to name call for vitals

## 2023-08-13 ENCOUNTER — Encounter (HOSPITAL_COMMUNITY): Payer: Self-pay

## 2023-08-13 ENCOUNTER — Ambulatory Visit (HOSPITAL_COMMUNITY)
Admission: EM | Admit: 2023-08-13 | Discharge: 2023-08-13 | Disposition: A | Discharge status: Home / Self Care | Payer: MEDICAID | Attending: Emergency Medicine | Admitting: Emergency Medicine

## 2023-08-13 DIAGNOSIS — L03115 Cellulitis of right lower limb: Secondary | ICD-10-CM | POA: Diagnosis not present

## 2023-08-13 DIAGNOSIS — L0291 Cutaneous abscess, unspecified: Secondary | ICD-10-CM

## 2023-08-13 MED ORDER — CEFTRIAXONE SODIUM 1 G IJ SOLR
1.0000 g | Freq: Once | INTRAMUSCULAR | Status: AC
  Administered 2023-08-13: 1 g via INTRAMUSCULAR

## 2023-08-13 MED ORDER — LIDOCAINE HCL (PF) 1 % IJ SOLN
INTRAMUSCULAR | Status: AC
  Filled 2023-08-13: qty 2

## 2023-08-13 MED ORDER — CEFTRIAXONE SODIUM 1 G IJ SOLR
INTRAMUSCULAR | Status: AC
  Filled 2023-08-13: qty 10

## 2023-08-13 MED ORDER — SULFAMETHOXAZOLE-TRIMETHOPRIM 800-160 MG PO TABS: 2.0000 | ORAL_TABLET | Freq: Two times a day (BID) | ORAL | 0 refills | Status: AC

## 2023-08-13 NOTE — ED Provider Notes (Signed)
 [facility]    CSN: [id number] Arrival date & time: 08/13/23  1745    HISTORY   Chief Complaint  Patient presents with   Abscess   HPI [name] is a pleasant, 38 y.o. female who presents to urgent care today. Patient complains of an abscess on her right upper thigh near her groin, states she shaves in the area and 2 days ago developed a boil.  Patient states she has been applying a warm compress but has not had relief, states the boil is now very big, very deep in the area around the skin is firm, red and warm to touch.  Patient denies red streaking down her leg, fever.  Patient states area is very painful to touch.  The history is provided by the patient.   Past Medical History:  Diagnosis Date   Abnormal Pap smear    Depression    no meds    Hx MRSA infection    History only - 2 Negative MRSA swabs in 2012   Iron deficiency anemia    Post-partum depression    hx   STD (female)    chlamydia and trich   SVD (spontaneous vaginal delivery)    x 5   Patient Active Problem List   Diagnosis Date Noted   Encounter for IUD insertion 11/15/2021   Acute cystitis without hematuria    Tenosynovitis of finger 04/15/2015   Osteomyelitis of finger (HCC)    Iron deficiency anemia 04/13/2015   Encounter for sterilization 12/02/2013   Contraception management 10/30/2013   Hepatitis B surface antigen positive 06/26/2012   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   Thrombocytopenia (HCC) 04/05/2011   Abnormal Pap smear of cervix 04/05/2011   Hx LEEP (loop electrosurgical excision procedure), cervix, pregnancy 04/05/2011   Past Surgical History:  Procedure Laterality Date   CERVICAL BIOPSY  W/ LOOP ELECTRODE EXCISION     finger nail removed     I & D EXTREMITY Right 04/13/2015   Procedure: RIGHT LONG FINGER DEBRIDEMENT AND FLEXOR TENDON SHEATH DRAINAGE;  Surgeon: Alm Hummer, MD;  Location: [facility];  Service: Orthopedics;  Laterality: Right;   INCISION AND  DRAINAGE Right 04/13/2015   long finger excisional debridement of skin and subcutaneous tissues, with irrigation/drainage of the flexor tendon sheath, and removal of nail plate   LAPAROSCOPIC TUBAL LIGATION Bilateral 12/02/2013   Procedure: LAPAROSCOPIC TUBAL LIGATION WITH FILSCHE CLIPS;  Surgeon: Lynwood KANDICE Solomons, MD;  Location: [facility];  Service: Gynecology;  Laterality: Bilateral;  FILSCHE CLIPS   TUBAL LIGATION Bilateral    WISDOM TOOTH EXTRACTION     OB History     Gravida  4   Para  4   Term  4   Preterm  0   AB  0   Living  5      SAB  0   IAB  0   Ectopic  0   Multiple  1   Live Births  5          Home Medications    Prior to Admission medications   Medication Sig Start Date End Date Taking? Authorizing Provider  alum & mag hydroxide-simeth (MAALOX MULTI SYMPTOM MAX ST) 400-400-40 MG/5ML suspension Take 5 mLs by mouth every 6 (six) hours as needed for indigestion. 12/03/21   Horton, Roxie HERO, DO  cefTRIAXone  2 g in dextrose  5 % 50 mL Inject 2 g into the vein daily. Patient not taking: Reported on 10/11/2021 04/16/15   Glennon,  Rankin, MD  diphenhydrAMINE  (BENADRYL ) 25 MG tablet Take 1 tablet (25 mg total) by mouth every 6 (six) hours as needed for up to 3 days. 08/06/18 08/09/18  Murray, Alyssa B, PA-C  diphenhydrAMINE  (BENADRYL ) 25 MG tablet Take 2 tablets (50 mg total) by mouth every 8 (eight) hours as needed for itching. 11/29/21   Laurice Maude BROCKS, MD  famotidine  (PEPCID ) 20 MG tablet Take 1 tablet (20 mg total) by mouth 2 (two) times daily for 3 days. 08/06/18 08/09/18  Murray, Alyssa B, PA-C  ferrous sulfate  325 (65 FE) MG tablet Take 1 tablet (325 mg total) by mouth daily with breakfast. Patient not taking: Reported on 10/11/2021 04/16/15   Glennon Rankin, MD  ibuprofen  (ADVIL ,MOTRIN ) 600 MG tablet Take 1 tablet (600 mg total) by mouth every 6 (six) hours as needed. Patient not taking: Reported on 10/11/2021 11/19/15   Caye Sieving, MD  lidocaine  (LIDODERM ) 5 % Place 1  patch onto the skin daily. Remove & Discard patch within 12 hours or as directed by MD Patient not taking: Reported on 10/11/2021 02/15/18   Garrick Charleston, MD  meloxicam  (MOBIC ) 15 MG tablet Take 1 tablet (15 mg total) by mouth daily. Patient not taking: Reported on 10/11/2021 04/16/15   Sebastian Lenis, MD  meloxicam  (MOBIC ) 15 MG tablet Take 1 tablet (15 mg total) by mouth daily. Patient not taking: Reported on 10/11/2021 04/16/15   Glennon Rankin, MD  ondansetron  (ZOFRAN ) 4 MG tablet Take 1 tablet (4 mg total) by mouth every 6 (six) hours as needed for nausea. Patient not taking: Reported on 10/11/2021 04/16/15   Glennon Rankin, MD  predniSONE  (DELTASONE ) 10 MG tablet Take 4 tablets (40 mg total) by mouth daily. 11/29/21   Laurice Maude BROCKS, MD    Family History Family History  Problem Relation Age of Onset   Cancer Paternal Grandmother    Hypertension Maternal Grandmother    Heart disease Maternal Grandmother    Diabetes Maternal Grandmother    Stroke Maternal Grandmother    Cancer Maternal Grandfather    Hypertension Mother    Social History Social History   Tobacco Use   Smoking status: Never   Smokeless tobacco: Never  Substance Use Topics   Alcohol use: Yes    Comment: 04/13/2015 stopped drinking in 03/2014   Drug use: No   Allergies   Patient has no known allergies.  Review of Systems Review of Systems Pertinent findings revealed after performing a 14 point review of systems has been noted in the history of present illness.  Physical Exam Vital Signs BP 127/87 (BP Location: Left Arm)   Pulse 89   Temp 98.3 F (36.8 C) (Oral)   Resp 16   SpO2 98%   No data found.  Physical Exam Vitals and nursing note reviewed.  Constitutional:      General: She is not in acute distress.    Appearance: Normal appearance.  HENT:     Head: Normocephalic and atraumatic.  Eyes:     Pupils: Pupils are equal, round, and reactive to light.  Cardiovascular:     Rate and Rhythm: Normal  rate and regular rhythm.  Pulmonary:     Effort: Pulmonary effort is normal.     Breath sounds: Normal breath sounds.  Musculoskeletal:        General: Normal range of motion.     Cervical back: Normal range of motion and neck supple.  Skin:    General: Skin is warm and dry.  Findings: Lesion (Abscess right upper thigh groin with surrounding erythema, induration without drainage, very tender to palpation) present.  Neurological:     General: No focal deficit present.     Mental Status: She is alert and oriented to person, place, and time. Mental status is at baseline.  Psychiatric:        Mood and Affect: Mood normal.        Behavior: Behavior normal.        Thought Content: Thought content normal.        Judgment: Judgment normal.     Visual Acuity Right Eye Distance:   Left Eye Distance:   Bilateral Distance:    Right Eye Near:   Left Eye Near:    Bilateral Near:     UC Couse / Diagnostics / Procedures:     Radiology No results found.  Procedures Procedures (including critical care time) EKG  Pending results:  Labs Reviewed - No data to display  Medications Ordered in UC: Medications  cefTRIAXone  (ROCEPHIN ) injection 1 g (has no administration in time range)    UC Diagnoses / Final Clinical Impressions(s)   I have reviewed the triage vital signs and the nursing notes.  Pertinent labs & imaging results that were available during my care of the patient were reviewed by me and considered in my medical decision making (see chart for details).    Final diagnoses:  Abscess  Cellulitis of right lower extremity   Patient provided with an injection of ceftriaxone  1000 mg during her visit and advised to begin Bactrim  double strength 2 tablets twice daily for the next 7 days.  Patient was advised can continue warm compresses if she chooses to do so.  Patient advised to the ED if no improvement in the next 24 hours.  Please see discharge instructions below for  details of plan of care as provided to patient. ED Prescriptions     Medication Sig Dispense Auth. Provider   sulfamethoxazole -trimethoprim  (BACTRIM  DS) 800-160 MG tablet Take 2 tablets by mouth 2 (two) times daily for 7 days. 28 tablet Joesph Shaver Scales, PA-C      PDMP not reviewed this encounter.  Pending results:  Labs Reviewed - No data to display    Discharge Instructions      To treat the infection in your right upper thigh, you received an injection of ceftriaxone , high-powered antibiotic.  This is resolve a lot of the swelling and pain fairly quickly.  This evening, please pick up your prescription for Bactrim  at the [facility] on [facility] and take 2 tablets before you go to bed tonight then continue taking 2 tablets twice daily until you complete the entire prescription.  You are welcome to continue applying warm compress to the area however you do not have to.  Please go to the emergency room if these 2 antibiotics do not significantly improve your symptoms in the next 24 to 48 hours.  Thank you for visiting [facility] today.      Disposition Upon Discharge:  Condition: stable for discharge home  Patient presented with an acute illness with associated systemic symptoms and significant discomfort requiring urgent management. In my opinion, this is a condition that a prudent lay person (someone who possesses an average knowledge of health and medicine) may potentially expect to result in complications if not addressed urgently such as respiratory distress, impairment of bodily function or dysfunction of bodily organs.   Routine symptom specific, illness specific and/or disease specific  instructions were discussed with the patient and/or caregiver at length.   As such, the patient has been evaluated and assessed, work-up was performed and treatment was provided in alignment with urgent care protocols and evidence based medicine.  Patient/parent/caregiver has  been advised that the patient may require follow up for further testing and treatment if the symptoms continue in spite of treatment, as clinically indicated and appropriate.  Patient/parent/caregiver has been advised to return to the [facility] or PCP if no better; to PCP or the Emergency Department if new signs and symptoms develop, or if the current signs or symptoms continue to change or worsen for further workup, evaluation and treatment as clinically indicated and appropriate  The patient will follow up with their current PCP if and as advised. If the patient does not currently have a PCP we will assist them in obtaining one.   The patient may need specialty follow up if the symptoms continue, in spite of conservative treatment and management, for further workup, evaluation, consultation and treatment as clinically indicated and appropriate.  Patient/parent/caregiver verbalized understanding and agreement of plan as discussed.  All questions were addressed during visit.  Please see discharge instructions below for further details of plan.  This office note has been dictated using Teaching laboratory technician.  Unfortunately, this method of dictation can sometimes lead to typographical or grammatical errors.  I apologize for your inconvenience in advance if this occurs.  Please do not hesitate to reach out to me if clarification is needed.      Joesph Shaver Scales, PA-C 08/13/23 1956

## 2023-08-13 NOTE — Discharge Instructions (Signed)
 To treat the infection in your right upper thigh, you received an injection of ceftriaxone , high-powered antibiotic.  This is resolve a lot of the swelling and pain fairly quickly.  This evening, please pick up your prescription [facility]rim  [facility] on [facility] and take 2 tablets before you go to bed tonight then continue taking 2 tablets twice daily until you complete the entire prescription.  You are welcome to continue applying warm compress to the area however you do not have to.  Please go to the emergency room if these 2 antibiotics do not significantly improve your symptoms in the next 24 to 48 hours.  Thank you for visiting [facility] today.

## 2023-08-16 ENCOUNTER — Encounter: Payer: Self-pay | Admitting: Obstetrics and Gynecology

## 2023-08-16 ENCOUNTER — Other Ambulatory Visit: Payer: Self-pay

## 2023-08-16 ENCOUNTER — Ambulatory Visit (INDEPENDENT_AMBULATORY_CARE_PROVIDER_SITE_OTHER): Payer: MEDICAID | Admitting: Obstetrics and Gynecology

## 2023-08-16 VITALS — BP 129/89 | HR 85 | Wt 183.3 lb

## 2023-08-16 DIAGNOSIS — Z113 Encounter for screening for infections with a predominantly sexual mode of transmission: Secondary | ICD-10-CM | POA: Diagnosis not present

## 2023-08-16 DIAGNOSIS — Z1331 Encounter for screening for depression: Secondary | ICD-10-CM

## 2023-08-16 DIAGNOSIS — Z01419 Encounter for gynecological examination (general) (routine) without abnormal findings: Secondary | ICD-10-CM

## 2023-08-16 NOTE — Progress Notes (Signed)
 ANNUAL EXAM Patient name: [name] MRN [id number]  Date of birth: Dec 05, 1985 Chief Complaint:   Gynecologic Exam  History of Present Illness:   [name] is a 38 y.o. G4P4005 being seen today for a routine annual exam.  Current complaints: none  Menstrual concerns? No   Breast or nipple changes? No recent negative mammogram Contraception use? Yes hx of TL Sexually active? Yes female partner x3 years  No LMP recorded. (Menstrual status: IUD).   The pregnancy intention screening data noted above was reviewed. Potential methods of contraception were discussed. The patient elected to proceed with No data recorded.   Last pap     Component Value Date/Time   DIAGPAP  10/11/2021 1402    - Negative for intraepithelial lesion or malignancy (NILM)   HPVHIGH Negative 10/11/2021 1402   ADEQPAP  10/11/2021 1402    Satisfactory for evaluation; transformation zone component PRESENT.     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  Last colonoscopy: n/a.      12/14/2021    4:11 PM 11/28/2021    9:57 AM  Depression screen PHQ 2/9  Decreased Interest 1 2  Down, Depressed, Hopeless 0 3  PHQ - 2 Score 1 5  Altered sleeping 0 3  Tired, decreased energy 1 2  Change in appetite 2 3  Feeling bad or failure about yourself  3 2  Trouble concentrating 0 0  Moving slowly or fidgety/restless 3 1  Suicidal thoughts 1 2  PHQ-9 Score 11 18        12/14/2021    4:24 PM 11/28/2021    9:58 AM  GAD 7 : Generalized Anxiety Score  Nervous, Anxious, on Edge 3 3  Control/stop worrying 3 2  Worry too much - different things 3 2  Trouble relaxing 2 3  Restless 2 2  Easily annoyed or irritable 3 3  Afraid - awful might happen 1 1  Total GAD 7 Score 17 16     Review of Systems:   Pertinent items are noted in HPI Denies any headaches, blurred vision, fatigue, shortness of breath, chest pain, abdominal pain, abnormal vaginal  discharge/itching/odor/irritation, problems with periods, bowel movements, urination, or intercourse unless otherwise stated above. Pertinent History Reviewed:  Reviewed past medical,surgical, social and family history.  Reviewed problem list, medications and allergies. Physical Assessment:  There were no vitals filed for this visit.There is no height or weight on file to calculate BMI.        Physical Examination:   General appearance - well appearing, and in no distress  Mental status - alert, oriented to person, place, and time  Psych:  She has a normal mood and affect  Skin - warm and dry, normal color, no suspicious lesions noted  Chest - effort normal, all lung fields clear to auscultation bilaterally  Heart - normal rate and regular rhythm  Breasts - breasts appear normal, no suspicious masses, no skin or nipple changes or  axillary nodes  Abdomen - soft, nontender, nondistended, no masses or organomegaly  Pelvic - deferred  Extremities:  No swelling or varicosities noted  Chaperone present for exam  No results found for this or any previous visit (from the past 24 hours).    Assessment & Plan:  1. Well woman exam with routine gynecological exam (Primary) - Cervical cancer screening: Discussed guidelines. Pap with HPV up to date - GC/CT: declines - Birth Control:  s/p TL - Breast Health: Encouraged self breast awareness/SBE. Teaching provided.  - F/U 12 months and prn  - RPR+HBsAg+HCVAb+...  2. Screening examination for STI Requested serum STI testing only.  - RPR+HBsAg+HCVAb+...    Orders Placed This Encounter  Procedures   RPR+HBsAg+HCVAb+...    Meds: No orders of the defined types were placed in this encounter.   Follow-up: Return for Annual GYN.  Carter Quarry, MD 08/16/2023 9:57 AM

## 2023-08-18 LAB — RPR+HBSAG+HCVAB+...
HIV Screen 4th Generation wRfx: NONREACTIVE
Hep C Virus Ab: NONREACTIVE
Hepatitis B Surface Ag: NEGATIVE
RPR Ser Ql: NONREACTIVE

## 2023-10-06 ENCOUNTER — Emergency Department (HOSPITAL_COMMUNITY): Payer: MEDICAID

## 2023-10-06 ENCOUNTER — Encounter (HOSPITAL_COMMUNITY): Payer: Self-pay

## 2023-10-06 ENCOUNTER — Other Ambulatory Visit: Payer: Self-pay

## 2023-10-06 ENCOUNTER — Emergency Department (HOSPITAL_COMMUNITY)
Admission: EM | Admit: 2023-10-06 | Discharge: 2023-10-06 | Disposition: A | Discharge status: Home / Self Care | Payer: MEDICAID | Attending: Emergency Medicine | Admitting: Emergency Medicine

## 2023-10-06 DIAGNOSIS — S6721XA Crushing injury of right hand, initial encounter: Secondary | ICD-10-CM | POA: Diagnosis present

## 2023-10-06 DIAGNOSIS — W232XXA Caught, crushed, jammed or pinched between a moving and stationary object, initial encounter: Secondary | ICD-10-CM | POA: Insufficient documentation

## 2023-10-06 LAB — COMPREHENSIVE METABOLIC PANEL
ALT: 15 U/L (ref 0–44)
AST: 18 U/L (ref 15–41)
Albumin: 3.8 g/dL (ref 3.5–5.0)
Alkaline Phosphatase: 48 U/L (ref 38–126)
Anion gap: 9 (ref 5–15)
BUN: 8 mg/dL (ref 6–20)
CO2: 24 mmol/L (ref 22–32)
Calcium: 9.1 mg/dL (ref 8.9–10.3)
Chloride: 107 mmol/L (ref 98–111)
Creatinine, Ser: 0.98 mg/dL (ref 0.44–1.00)
GFR, Estimated: >60 mL/min (ref >60)
Glucose, Bld: 99 mg/dL (ref 70–99)
Potassium: 3.6 mmol/L (ref 3.5–5.1)
Sodium: 140 mmol/L (ref 135–145)
Total Bilirubin: 1.2 mg/dL (ref 0.0–1.2)
Total Protein: 6.9 g/dL (ref 6.5–8.1)

## 2023-10-06 LAB — CBC WITH DIFFERENTIAL/PLATELET
Abs Immature Granulocytes: 0.02 10*3/uL (ref 0.00–0.07)
Basophils Absolute: 0 10*3/uL (ref 0.0–0.1)
Basophils Relative: 1 %
Eosinophils Absolute: 0.1 10*3/uL (ref 0.0–0.5)
Eosinophils Relative: 3 %
HCT: 39.5 % (ref 36.0–46.0)
Hemoglobin: 13.4 g/dL (ref 12.0–15.0)
Immature Granulocytes: 1 %
Lymphocytes Relative: 37 %
Lymphs Abs: 1.3 10*3/uL (ref 0.7–4.0)
MCH: 30.9 pg (ref 26.0–34.0)
MCHC: 33.9 g/dL (ref 30.0–36.0)
MCV: 91 fL (ref 80.0–100.0)
Monocytes Absolute: 0.2 10*3/uL (ref 0.1–1.0)
Monocytes Relative: 7 %
Neutro Abs: 1.9 10*3/uL (ref 1.7–7.7)
Neutrophils Relative %: 51 %
Platelets: 181 10*3/uL (ref 150–400)
RBC: 4.34 MIL/uL (ref 3.87–5.11)
RDW: 11.5 % (ref 11.5–15.5)
WBC: 3.5 10*3/uL — ABNORMAL LOW (ref 4.0–10.5)
nRBC: 0 % (ref 0.0–0.2)

## 2023-10-06 LAB — I-STAT CG4 LACTIC ACID, ED
Lactic Acid, Venous: 1.1 mmol/L (ref 0.5–1.9)
Lactic Acid, Venous: 0.5 mmol/L (ref 0.5–1.9)

## 2023-10-06 LAB — HCG, SERUM, QUALITATIVE: Preg, Serum: NEGATIVE

## 2023-10-06 NOTE — ED Triage Notes (Signed)
 Pt states she has had pain in right hand and is unable to open her hand or extend outer 3 fingers of right hand. Pt states her hand has been clenched up for several months.

## 2023-10-06 NOTE — Discharge Instructions (Addendum)
 Please call and schedule an appointment with Dr. Frazier Butt.  His office information is listed above.  Develop any emergent symptoms please return to the emergency room. X-ray and blood work did not show any concerning findings.
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
## 2024-05-14 ENCOUNTER — Encounter: Payer: Self-pay | Admitting: Obstetrics and Gynecology

## 2024-05-14 ENCOUNTER — Ambulatory Visit: Payer: MEDICAID | Admitting: Obstetrics and Gynecology

## 2024-05-14 ENCOUNTER — Other Ambulatory Visit (HOSPITAL_COMMUNITY)
Admission: RE | Admit: 2024-05-14 | Discharge: 2024-05-14 | Disposition: A | Discharge status: Home / Self Care | Payer: MEDICAID | Source: Ambulatory Visit | Attending: Obstetrics and Gynecology | Admitting: Obstetrics and Gynecology

## 2024-05-14 VITALS — BP 107/80 | HR 95 | Wt 183.0 lb

## 2024-05-14 DIAGNOSIS — N730 Acute parametritis and pelvic cellulitis: Secondary | ICD-10-CM | POA: Insufficient documentation

## 2024-05-14 DIAGNOSIS — N898 Other specified noninflammatory disorders of vagina: Secondary | ICD-10-CM | POA: Insufficient documentation

## 2024-05-14 DIAGNOSIS — R300 Dysuria: Secondary | ICD-10-CM

## 2024-05-14 DIAGNOSIS — Z3202 Encounter for pregnancy test, result negative: Secondary | ICD-10-CM

## 2024-05-14 LAB — POCT PREGNANCY, URINE: Preg Test, Ur: NEGATIVE

## 2024-05-14 LAB — POCT URINALYSIS DIP (DEVICE)
Bilirubin Urine: NEGATIVE
Glucose, UA: NEGATIVE mg/dL
Ketones, ur: NEGATIVE mg/dL
Nitrite: NEGATIVE
Protein, ur: >=300 mg/dL — AB
Specific Gravity, Urine: 1.025 (ref 1.005–1.030)
Urobilinogen, UA: 1 mg/dL (ref 0.0–1.0)
pH: 7 (ref 5.0–8.0)

## 2024-05-14 MED ORDER — DOXYCYCLINE HYCLATE 100 MG PO CAPS: 100.0000 mg | ORAL_CAPSULE | Freq: Two times a day (BID) | ORAL | 0 refills | Status: AC

## 2024-05-14 MED ORDER — METRONIDAZOLE 500 MG PO TABS: 500.0000 mg | ORAL_TABLET | Freq: Two times a day (BID) | ORAL | 0 refills | Status: AC

## 2024-05-14 NOTE — Progress Notes (Signed)
 GYNECOLOGY VISIT  Patient name: [name] MRN [id number]  Date of birth: November 15, 1985 Chief Complaint:   IUD Check  History:  Discussed the use of AI scribe software for clinical note transcription with the patient, who gave verbal consent to proceed.  History of Present Illness Kenslei Hearty is a 38 year old female who presents with pelvic pain and urinary symptoms.  She has been experiencing pelvic pain for approximately one week, which began following intercourse. The pain is described as a lot of pressure, particularly when urinating, and feels like pressure on her uterus. She urinates in small amounts to avoid significant pain.  She has a history of urinary tract infections and feels that her current symptoms are similar to those she experienced during past infections. No recent bleeding, itching, or changes in discharge. She has been with the same partner for four years and does not suspect any STI exposure.  No fever or chills, but significant pressure affects her daily activities, such as attending classes at [employer], where she has to use the elevator, instead of stairs, due to discomfort.  She does not consume alcohol or use drugs, and she has never smoked cigarettes. She is currently busy with work and school, which limits her social activities.  The following portions of the patient's history were reviewed and updated as appropriate: allergies, current medications, past family history, past medical history, past social history, past surgical history and problem list.   Health Maintenance:   Last pap     Component Value Date/Time   DIAGPAP  10/11/2021 1402    - Negative for intraepithelial lesion or malignancy (NILM)   HPVHIGH Negative 10/11/2021 1402   ADEQPAP  10/11/2021 1402    Satisfactory for evaluation; transformation zone component PRESENT.    Health Maintenance  Topic Date Due   Hepatitis B Vaccine (1 of 3 - 19+ 3-dose series) Never done   HPV Vaccine (1 -  3-dose SCDM series) Never done   Flu Shot  03/07/2024   COVID-19 Vaccine (1 - 2024-25 season) Never done   DTaP/Tdap/Td vaccine (2 - Td or Tdap) 04/12/2025   Pap with HPV screening  10/12/2026   Hepatitis C Screening  Completed   HIV Screening  Completed   Pneumococcal Vaccine  Aged Out   Meningitis B Vaccine  Aged Out      Review of Systems:  Pertinent items are noted in HPI. Comprehensive review of systems was otherwise negative.   Objective:  Physical Exam BP 107/80   Pulse 95   Wt 183 lb (83 kg)   BMI 24.14 kg/m    Physical Exam Vitals and nursing note reviewed. Exam conducted with a chaperone present.  Constitutional:      Appearance: Normal appearance.  HENT:     Head: Normocephalic and atraumatic.  Pulmonary:     Effort: Pulmonary effort is normal.     Breath sounds: Normal breath sounds.  Abdominal:     Comments: Suprapubic tenderness without rebound or guarding  Genitourinary:    General: Normal vulva.     Exam position: Lithotomy position.     Vagina: Normal.     Cervix: Normal.     Comments: White discharge IUD strings visualized Mild CMT noted No adnexal tenderness No bladder tenderness on internal exam Mild levator ani tenderness Skin:    General: Skin is warm and dry.  Neurological:     General: No focal deficit present.     Mental Status: She is alert.  Psychiatric:        Mood and Affect: Mood normal.        Behavior: Behavior normal.        Thought Content: Thought content normal.        Judgment: Judgment normal.      Assessment & Plan:   Assessment & Plan Suspected pelvic inflammatory disease Suspected PID due to uterine tenderness and pressure, possibly related to an inflammatory process or bacterial infection. Differential diagnosis includes PID associated with STIs or non-STI causes. No fever, chills, or systemic symptoms. IUD not suspected as cause. STI testing necessary to rule out associated infections. - Vaginitis swab  collected - Prescribe doxycycline and metronidazole  for two weeks. - Advise avoiding sun exposure due to doxycycline and alcohol due to metronidazole . - Discuss potential side effects of antibiotics, including skin sensitivity, rash, and stomach upset. - Advise IUD can remain in place during treatment.  Dysuria Dysuria with pressure during urination, possibly indicative of a UTI. Symptoms similar to previous UTI. Urine culture necessary to confirm UTI.  - Send urine for culture; will treat accordingly     Rastus Borton, MD [facility] for [facility], [facility]

## 2024-05-15 ENCOUNTER — Ambulatory Visit: Payer: Self-pay | Admitting: Obstetrics and Gynecology

## 2024-05-15 DIAGNOSIS — B3731 Acute candidiasis of vulva and vagina: Secondary | ICD-10-CM

## 2024-05-15 DIAGNOSIS — N3001 Acute cystitis with hematuria: Secondary | ICD-10-CM

## 2024-05-15 LAB — CERVICOVAGINAL ANCILLARY ONLY
Bacterial Vaginitis (gardnerella): POSITIVE — AB
Candida Glabrata: NEGATIVE
Candida Vaginitis: POSITIVE — AB
Chlamydia: NEGATIVE
Comment: NEGATIVE
Comment: NEGATIVE
Comment: NEGATIVE
Comment: NEGATIVE
Comment: NEGATIVE
Comment: NORMAL
Neisseria Gonorrhea: NEGATIVE
Trichomonas: NEGATIVE

## 2024-05-15 MED ORDER — FLUCONAZOLE 150 MG PO TABS: 150.0000 mg | ORAL_TABLET | Freq: Once | ORAL | 3 refills | Status: AC

## 2024-05-16 LAB — URINE CULTURE

## 2024-05-16 MED ORDER — NITROFURANTOIN MONOHYD MACRO 100 MG PO CAPS: 100.0000 mg | ORAL_CAPSULE | Freq: Two times a day (BID) | ORAL | 0 refills | Status: AC

## 2024-05-22 ENCOUNTER — Encounter: Payer: Self-pay | Admitting: Obstetrics and Gynecology

## 2024-06-06 ENCOUNTER — Ambulatory Visit (INDEPENDENT_AMBULATORY_CARE_PROVIDER_SITE_OTHER): Payer: MEDICAID | Admitting: Obstetrics and Gynecology

## 2024-06-06 ENCOUNTER — Encounter: Payer: Self-pay | Admitting: Obstetrics and Gynecology

## 2024-06-06 ENCOUNTER — Other Ambulatory Visit: Payer: Self-pay

## 2024-06-06 ENCOUNTER — Ambulatory Visit: Payer: MEDICAID | Admitting: Obstetrics and Gynecology

## 2024-06-06 VITALS — BP 110/81 | HR 80

## 2024-06-06 DIAGNOSIS — N730 Acute parametritis and pelvic cellulitis: Secondary | ICD-10-CM | POA: Diagnosis not present

## 2024-06-06 DIAGNOSIS — Z1331 Encounter for screening for depression: Secondary | ICD-10-CM | POA: Diagnosis not present

## 2024-06-06 DIAGNOSIS — R300 Dysuria: Secondary | ICD-10-CM | POA: Diagnosis not present

## 2024-06-06 LAB — POCT URINALYSIS DIP (DEVICE)
Bilirubin Urine: NEGATIVE
Glucose, UA: NEGATIVE mg/dL
Ketones, ur: NEGATIVE mg/dL
Leukocytes,Ua: NEGATIVE
Nitrite: NEGATIVE
Protein, ur: 30 mg/dL — AB
Specific Gravity, Urine: 1.025 (ref 1.005–1.030)
Urobilinogen, UA: 0.2 mg/dL (ref 0.0–1.0)
pH: 6.5 (ref 5.0–8.0)

## 2024-06-06 NOTE — Progress Notes (Deleted)
    GYNECOLOGY VISIT  Patient name: [name] MRN [id number]  Date of birth: 1986-02-23 Chief Complaint:   Gynecologic Exam and Follow-up   History:  [name] has been drinking a lot of water. Finishe ABX    The following portions of the patient's history were reviewed and updated as appropriate: allergies, current medications, past family history, past medical history, past social history, past surgical history and problem list.   Health Maintenance:   Last pap     Component Value Date/Time   DIAGPAP  10/11/2021 1402    - Negative for intraepithelial lesion or malignancy (NILM)   HPVHIGH Negative 10/11/2021 1402   ADEQPAP  10/11/2021 1402    Satisfactory for evaluation; transformation zone component PRESENT.    Health Maintenance  Topic Date Due  . Hepatitis B Vaccine (1 of 3 - 19+ 3-dose series) Never done  . HPV Vaccine (1 - 3-dose SCDM series) Never done  . Flu Shot  03/07/2024  . COVID-19 Vaccine (1 - 2025-26 season) Never done  . DTaP/Tdap/Td vaccine (2 - Td or Tdap) 04/12/2025  . Pap with HPV screening  10/12/2026  . Hepatitis C Screening  Completed  . HIV Screening  Completed  . Pneumococcal Vaccine  Aged Out  . Meningitis B Vaccine  Aged Out      Review of Systems:  {Ros - complete:30496} Comprehensive review of systems was otherwise negative.   Objective:  Physical Exam BP 110/81   Pulse 80    Physical Exam   Labs and Imaging No results found.     Assessment & Plan:  Assessment and Plan Assessment & Plan        *** Routine preventative health maintenance measures emphasized.  Carter Quarry, MD [facility] for [facility], [facility]

## 2024-06-06 NOTE — Progress Notes (Signed)
    GYNECOLOGY VISIT  Patient name: Veola Cafaro MRN [id number]  Date of birth: [birth year] Chief Complaint:   Gynecologic Exam and Follow-up  History:  Eulala Newcombe here for follow up. Completed antibiotics given and has had complete resolution of symptoms. No longer having pain. No abnormal vaginal discharge. No fever or chills. No issues with bowel habits. Has not had intercourse as of yet. Requesting condoms as well. Drinking a lot of water and not having any pain with urination.    The following portions of the patient's history were reviewed and updated as appropriate: allergies, current medications, past family history, past medical history, past social history, past surgical history and problem list.   Health Maintenance:   Last pap     Component Value Date/Time   DIAGPAP  10/11/2021 1402    - Negative for intraepithelial lesion or malignancy (NILM)   HPVHIGH Negative 10/11/2021 1402   ADEQPAP  10/11/2021 1402    Satisfactory for evaluation; transformation zone component PRESENT.    Health Maintenance  Topic Date Due   Hepatitis B Vaccine (1 of 3 - 19+ 3-dose series) Never done   HPV Vaccine (1 - 3-dose SCDM series) Never done   Flu Shot  03/07/2024   COVID-19 Vaccine (1 - 2025-26 season) Never done   DTaP/Tdap/Td vaccine (2 - Td or Tdap) 04/12/2025   Pap with HPV screening  10/12/2026   Hepatitis C Screening  Completed   HIV Screening  Completed   Pneumococcal Vaccine  Aged Out   Meningitis B Vaccine  Aged Out     Review of Systems:  Pertinent items are noted in HPI. Comprehensive review of systems was otherwise negative.   Objective:  Physical Exam BP 110/81   Pulse 80    Physical Exam Vitals and nursing note reviewed.  Constitutional:      Appearance: Normal appearance.  HENT:     Head: Normocephalic and atraumatic.  Pulmonary:     Effort: Pulmonary effort is normal.  Abdominal:     Tenderness: There is no abdominal tenderness.  Skin:    General:  Skin is warm and dry.  Neurological:     General: No focal deficit present.     Mental Status: She is alert.  Psychiatric:        Mood and Affect: Mood normal.        Behavior: Behavior normal.        Thought Content: Thought content normal.        Judgment: Judgment normal.         Assessment & Plan:   1. PID (acute pelvic inflammatory disease) (Primary) 2. Dysuria Resolved s/p antibiotic treatment. Ok to resume sexual intercourse. Has opted to use condoms to minimize recurrence. IUD in place and s/p TL.  UA ok    Carter Quarry, MD [facility] for [facility], [facility]

## 2024-09-25 ENCOUNTER — Ambulatory Visit: Payer: Self-pay | Admitting: Obstetrics and Gynecology
